# Patient Record
Sex: Male | Born: 1940 | Race: Asian | Hispanic: No | Smoking: Former smoker
Health system: Southern US, Community
[De-identification: ages and names within clinical notes are randomized; demographics above are authoritative.]

## PROBLEM LIST (undated history)

## (undated) DIAGNOSIS — E785 Hyperlipidemia, unspecified: Secondary | ICD-10-CM

## (undated) DIAGNOSIS — R221 Localized swelling, mass and lump, neck: Secondary | ICD-10-CM

## (undated) DIAGNOSIS — J069 Acute upper respiratory infection, unspecified: Secondary | ICD-10-CM

## (undated) DIAGNOSIS — R319 Hematuria, unspecified: Secondary | ICD-10-CM

## (undated) DIAGNOSIS — M545 Low back pain, unspecified: Secondary | ICD-10-CM

## (undated) DIAGNOSIS — Z1211 Encounter for screening for malignant neoplasm of colon: Secondary | ICD-10-CM

## (undated) DIAGNOSIS — N400 Enlarged prostate without lower urinary tract symptoms: Secondary | ICD-10-CM

## (undated) DIAGNOSIS — K635 Polyp of colon: Secondary | ICD-10-CM

## (undated) DIAGNOSIS — M25519 Pain in unspecified shoulder: Secondary | ICD-10-CM

## (undated) DIAGNOSIS — K047 Periapical abscess without sinus: Secondary | ICD-10-CM

## (undated) HISTORY — DX: Periapical abscess without sinus: K04.7

## (undated) HISTORY — DX: Low back pain: M54.5

## (undated) HISTORY — DX: Low back pain, unspecified: M54.50

## (undated) HISTORY — DX: Encounter for screening for malignant neoplasm of colon: Z12.11

## (undated) HISTORY — DX: Polyp of colon: K63.5

## (undated) HISTORY — PX: COLONOSCOPY: SHX174

## (undated) HISTORY — DX: Benign prostatic hyperplasia without lower urinary tract symptoms: N40.0

## (undated) HISTORY — DX: Hyperlipidemia, unspecified: E78.5

## (undated) HISTORY — PX: HAND SURGERY: SHX662

## (undated) HISTORY — DX: Pain in unspecified shoulder: M25.519

## (undated) HISTORY — PX: POLYPECTOMY: SHX149

## (undated) HISTORY — DX: Localized swelling, mass and lump, neck: R22.1

## (undated) HISTORY — DX: Acute upper respiratory infection, unspecified: J06.9

## (undated) HISTORY — DX: Hematuria, unspecified: R31.9

---

## 2003-09-27 ENCOUNTER — Inpatient Hospital Stay (HOSPITAL_COMMUNITY): Admission: EM | Admit: 2003-09-27 | Discharge: 2003-09-29 | Payer: Self-pay | Admitting: Emergency Medicine

## 2004-05-09 LAB — HM COLONOSCOPY

## 2004-08-29 ENCOUNTER — Ambulatory Visit: Payer: Self-pay | Admitting: Internal Medicine

## 2004-10-17 ENCOUNTER — Ambulatory Visit: Payer: Self-pay | Admitting: Internal Medicine

## 2005-01-16 ENCOUNTER — Ambulatory Visit: Payer: Self-pay | Admitting: Internal Medicine

## 2005-09-21 ENCOUNTER — Ambulatory Visit: Payer: Self-pay | Admitting: Internal Medicine

## 2005-09-25 ENCOUNTER — Ambulatory Visit: Payer: Self-pay | Admitting: Internal Medicine

## 2006-01-01 ENCOUNTER — Ambulatory Visit: Payer: Self-pay | Admitting: Internal Medicine

## 2006-04-02 ENCOUNTER — Ambulatory Visit: Payer: Self-pay | Admitting: Internal Medicine

## 2006-04-09 ENCOUNTER — Ambulatory Visit: Payer: Self-pay | Admitting: Internal Medicine

## 2006-10-08 ENCOUNTER — Ambulatory Visit: Payer: Self-pay | Admitting: Internal Medicine

## 2006-10-08 ENCOUNTER — Ambulatory Visit (HOSPITAL_COMMUNITY): Admission: RE | Admit: 2006-10-08 | Discharge: 2006-10-08 | Payer: Self-pay | Admitting: Internal Medicine

## 2007-01-07 ENCOUNTER — Ambulatory Visit: Payer: Self-pay | Admitting: Internal Medicine

## 2007-01-28 ENCOUNTER — Ambulatory Visit: Payer: Self-pay | Admitting: Internal Medicine

## 2007-01-28 LAB — CONVERTED CEMR LAB
ALT: 33 units/L (ref 0–40)
AST: 25 units/L (ref 0–37)
Basophils Absolute: 0 10*3/uL (ref 0.0–0.1)
Basophils Relative: 0.2 % (ref 0.0–1.0)
Cholesterol: 144 mg/dL (ref 0–200)
Eosinophils Absolute: 0.2 10*3/uL (ref 0.0–0.6)
Eosinophils Relative: 4.3 % (ref 0.0–5.0)
Folate: 19.7 ng/mL
HCT: 42.6 % (ref 39.0–52.0)
HDL: 41 mg/dL (ref >39.0)
Hemoglobin: 14.6 g/dL (ref 13.0–17.0)
Hgb A1c MFr Bld: 5.9 % (ref 4.6–6.0)
LDL Cholesterol: 87 mg/dL (ref 0–99)
Lymphocytes Relative: 39.8 % (ref 12.0–46.0)
MCHC: 34.3 g/dL (ref 30.0–36.0)
MCV: 100.8 fL — ABNORMAL HIGH (ref 78.0–100.0)
Monocytes Absolute: 0.3 10*3/uL (ref 0.2–0.7)
Monocytes Relative: 7.2 % (ref 3.0–11.0)
Neutro Abs: 2.2 10*3/uL (ref 1.4–7.7)
Neutrophils Relative %: 48.5 % (ref 43.0–77.0)
PSA: 1.05 ng/mL
PSA: 1.05 ng/mL (ref 0.10–4.00)
Platelets: 155 10*3/uL (ref 150–400)
RBC: 4.23 M/uL (ref 4.22–5.81)
RDW: 11.9 % (ref 11.5–14.6)
TSH: 1.52 microintl units/mL (ref 0.35–5.50)
Total CHOL/HDL Ratio: 3.5
Triglycerides: 82 mg/dL (ref 0–149)
VLDL: 16 mg/dL (ref 0–40)
Vitamin B-12: 586 pg/mL (ref 211–911)
WBC: 4.5 10*3/uL (ref 4.5–10.5)

## 2007-05-31 ENCOUNTER — Encounter: Payer: Self-pay | Admitting: Internal Medicine

## 2007-05-31 DIAGNOSIS — E782 Mixed hyperlipidemia: Secondary | ICD-10-CM | POA: Insufficient documentation

## 2007-05-31 DIAGNOSIS — Z86010 Personal history of colonic polyps: Secondary | ICD-10-CM

## 2007-05-31 DIAGNOSIS — Z87438 Personal history of other diseases of male genital organs: Secondary | ICD-10-CM

## 2007-05-31 DIAGNOSIS — M810 Age-related osteoporosis without current pathological fracture: Secondary | ICD-10-CM | POA: Insufficient documentation

## 2007-05-31 DIAGNOSIS — M545 Low back pain: Secondary | ICD-10-CM

## 2007-07-07 ENCOUNTER — Ambulatory Visit: Payer: Self-pay | Admitting: Internal Medicine

## 2007-12-06 ENCOUNTER — Ambulatory Visit: Payer: Self-pay | Admitting: Internal Medicine

## 2007-12-06 DIAGNOSIS — R0602 Shortness of breath: Secondary | ICD-10-CM

## 2008-03-15 ENCOUNTER — Encounter: Payer: Self-pay | Admitting: Internal Medicine

## 2008-06-08 ENCOUNTER — Ambulatory Visit: Payer: Self-pay | Admitting: Internal Medicine

## 2008-06-08 LAB — CONVERTED CEMR LAB
Free T4: 0.8 ng/dL (ref 0.6–1.6)
T3, Free: 2.9 pg/mL (ref 2.3–4.2)
TSH: 1.33 microintl units/mL (ref 0.35–5.50)

## 2008-06-11 ENCOUNTER — Encounter: Payer: Self-pay | Admitting: Internal Medicine

## 2008-11-06 ENCOUNTER — Ambulatory Visit: Payer: Self-pay | Admitting: Internal Medicine

## 2008-11-06 DIAGNOSIS — K3189 Other diseases of stomach and duodenum: Secondary | ICD-10-CM

## 2008-11-06 DIAGNOSIS — R1013 Epigastric pain: Secondary | ICD-10-CM

## 2008-11-06 DIAGNOSIS — M25539 Pain in unspecified wrist: Secondary | ICD-10-CM

## 2008-11-07 DIAGNOSIS — E119 Type 2 diabetes mellitus without complications: Secondary | ICD-10-CM

## 2008-11-07 DIAGNOSIS — E118 Type 2 diabetes mellitus with unspecified complications: Secondary | ICD-10-CM | POA: Insufficient documentation

## 2008-11-07 DIAGNOSIS — E1165 Type 2 diabetes mellitus with hyperglycemia: Secondary | ICD-10-CM | POA: Insufficient documentation

## 2008-11-09 ENCOUNTER — Telehealth: Payer: Self-pay | Admitting: Internal Medicine

## 2008-11-09 ENCOUNTER — Encounter: Payer: Self-pay | Admitting: Internal Medicine

## 2008-11-16 ENCOUNTER — Ambulatory Visit: Payer: Self-pay | Admitting: Internal Medicine

## 2008-11-16 DIAGNOSIS — I10 Essential (primary) hypertension: Secondary | ICD-10-CM

## 2008-11-16 LAB — CONVERTED CEMR LAB
ALT: 32 units/L (ref 0–53)
AST: 23 units/L (ref 0–37)
BUN: 13 mg/dL (ref 6–23)
CO2: 30 meq/L (ref 19–32)
Calcium: 9.2 mg/dL (ref 8.4–10.5)
Chloride: 104 meq/L (ref 96–112)
Cholesterol: 155 mg/dL (ref 0–200)
Creatinine, Ser: 0.7 mg/dL (ref 0.4–1.5)
Creatinine,U: 111 mg/dL
GFR calc Af Amer: 145 mL/min
GFR calc non Af Amer: 120 mL/min
Glucose, Bld: 104 mg/dL — ABNORMAL HIGH (ref 70–99)
HDL: 37.7 mg/dL — ABNORMAL LOW (ref >39.0)
Hgb A1c MFr Bld: 6 % (ref 4.6–6.0)
LDL Cholesterol: 93 mg/dL (ref 0–99)
Microalb Creat Ratio: 5.4 mg/g (ref 0.0–30.0)
Microalb, Ur: 0.6 mg/dL (ref 0.0–1.9)
Potassium: 4.1 meq/L (ref 3.5–5.1)
Sodium: 140 meq/L (ref 135–145)
TSH: 0.93 microintl units/mL (ref 0.35–5.50)
Total CHOL/HDL Ratio: 4.1
Triglycerides: 121 mg/dL (ref 0–149)
VLDL: 24 mg/dL (ref 0–40)

## 2009-04-17 ENCOUNTER — Encounter (INDEPENDENT_AMBULATORY_CARE_PROVIDER_SITE_OTHER): Payer: Self-pay | Admitting: *Deleted

## 2009-06-07 ENCOUNTER — Ambulatory Visit: Payer: Self-pay | Admitting: Internal Medicine

## 2009-06-14 ENCOUNTER — Ambulatory Visit: Payer: Self-pay | Admitting: Internal Medicine

## 2009-06-17 ENCOUNTER — Encounter: Payer: Self-pay | Admitting: Internal Medicine

## 2009-06-21 LAB — CONVERTED CEMR LAB
ALT: 25 units/L (ref 0–53)
AST: 20 units/L (ref 0–37)
Albumin: 4.3 g/dL (ref 3.5–5.2)
Alkaline Phosphatase: 65 units/L (ref 39–117)
BUN: 13 mg/dL (ref 6–23)
Bilirubin, Direct: 0.2 mg/dL (ref 0.0–0.3)
CO2: 23 meq/L (ref 19–32)
Calcium: 9 mg/dL (ref 8.4–10.5)
Chloride: 109 meq/L (ref 96–112)
Cholesterol: 141 mg/dL (ref 0–200)
Creatinine, Ser: 0.88 mg/dL (ref 0.40–1.50)
Creatinine, Urine: 168.2 mg/dL
Glucose, Bld: 107 mg/dL — ABNORMAL HIGH (ref 70–99)
HDL: 39 mg/dL — ABNORMAL LOW (ref >39)
Hgb A1c MFr Bld: 6 % (ref 4.6–6.1)
Indirect Bilirubin: 0.6 mg/dL (ref 0.0–0.9)
LDL Cholesterol: 75 mg/dL (ref 0–99)
Microalb Creat Ratio: 4.3 mg/g (ref 0.0–30.0)
Microalb, Ur: 0.72 mg/dL (ref 0.00–1.89)
Potassium: 4.2 meq/L (ref 3.5–5.3)
Sodium: 141 meq/L (ref 135–145)
TSH: 1.488 microintl units/mL (ref 0.350–4.500)
Total Bilirubin: 0.8 mg/dL (ref 0.3–1.2)
Total CHOL/HDL Ratio: 3.6
Total Protein: 7.4 g/dL (ref 6.0–8.3)
Triglycerides: 133 mg/dL (ref <150)
VLDL: 27 mg/dL (ref 0–40)

## 2009-08-26 ENCOUNTER — Ambulatory Visit (HOSPITAL_BASED_OUTPATIENT_CLINIC_OR_DEPARTMENT_OTHER): Admission: RE | Admit: 2009-08-26 | Discharge: 2009-08-26 | Payer: Self-pay | Admitting: Internal Medicine

## 2009-08-26 ENCOUNTER — Ambulatory Visit: Payer: Self-pay | Admitting: Internal Medicine

## 2009-08-26 DIAGNOSIS — R05 Cough: Secondary | ICD-10-CM

## 2009-10-30 ENCOUNTER — Telehealth: Payer: Self-pay | Admitting: Internal Medicine

## 2009-10-30 ENCOUNTER — Ambulatory Visit: Payer: Self-pay | Admitting: Interventional Radiology

## 2009-10-30 ENCOUNTER — Ambulatory Visit (HOSPITAL_BASED_OUTPATIENT_CLINIC_OR_DEPARTMENT_OTHER): Admission: RE | Admit: 2009-10-30 | Discharge: 2009-10-30 | Payer: Self-pay | Admitting: Internal Medicine

## 2009-10-30 ENCOUNTER — Ambulatory Visit: Payer: Self-pay | Admitting: Internal Medicine

## 2009-11-04 ENCOUNTER — Telehealth: Payer: Self-pay | Admitting: Internal Medicine

## 2009-12-06 ENCOUNTER — Telehealth: Payer: Self-pay | Admitting: Internal Medicine

## 2009-12-20 ENCOUNTER — Ambulatory Visit: Payer: Self-pay | Admitting: Internal Medicine

## 2009-12-20 ENCOUNTER — Telehealth: Payer: Self-pay | Admitting: Internal Medicine

## 2009-12-20 ENCOUNTER — Ambulatory Visit: Payer: Self-pay | Admitting: Diagnostic Radiology

## 2009-12-20 ENCOUNTER — Ambulatory Visit (HOSPITAL_BASED_OUTPATIENT_CLINIC_OR_DEPARTMENT_OTHER): Admission: RE | Admit: 2009-12-20 | Discharge: 2009-12-20 | Payer: Self-pay | Admitting: Internal Medicine

## 2009-12-20 DIAGNOSIS — J111 Influenza due to unidentified influenza virus with other respiratory manifestations: Secondary | ICD-10-CM

## 2010-05-08 ENCOUNTER — Encounter: Payer: Self-pay | Admitting: Internal Medicine

## 2010-06-20 ENCOUNTER — Telehealth: Payer: Self-pay | Admitting: Internal Medicine

## 2010-07-18 ENCOUNTER — Ambulatory Visit: Payer: Self-pay | Admitting: Internal Medicine

## 2010-07-18 LAB — HM DIABETES FOOT EXAM

## 2010-07-21 ENCOUNTER — Telehealth: Payer: Self-pay | Admitting: Internal Medicine

## 2010-07-31 ENCOUNTER — Encounter (INDEPENDENT_AMBULATORY_CARE_PROVIDER_SITE_OTHER): Payer: Self-pay | Admitting: *Deleted

## 2010-08-01 ENCOUNTER — Encounter: Payer: Self-pay | Admitting: Internal Medicine

## 2010-08-12 ENCOUNTER — Telehealth: Payer: Self-pay | Admitting: Internal Medicine

## 2010-08-22 ENCOUNTER — Telehealth: Payer: Self-pay | Admitting: Internal Medicine

## 2010-08-29 ENCOUNTER — Encounter: Payer: Self-pay | Admitting: Internal Medicine

## 2010-08-31 ENCOUNTER — Encounter: Payer: Self-pay | Admitting: Internal Medicine

## 2010-09-02 LAB — CONVERTED CEMR LAB
ALT: 31 units/L (ref 0–53)
AST: 25 units/L (ref 0–37)
Albumin: 4.6 g/dL (ref 3.5–5.2)
Alkaline Phosphatase: 67 units/L (ref 39–117)
BUN: 13 mg/dL (ref 6–23)
Bilirubin, Direct: 0.3 mg/dL (ref 0.0–0.3)
CO2: 25 meq/L (ref 19–32)
Calcium: 9 mg/dL (ref 8.4–10.5)
Chloride: 105 meq/L (ref 96–112)
Cholesterol: 114 mg/dL (ref 0–200)
Creatinine, Ser: 0.96 mg/dL (ref 0.40–1.50)
Creatinine, Urine: 147.1 mg/dL
Glucose, Bld: 96 mg/dL (ref 70–99)
HDL: 35 mg/dL — ABNORMAL LOW (ref >39)
Hgb A1c MFr Bld: 5.8 % — ABNORMAL HIGH (ref <5.7)
Indirect Bilirubin: 0.6 mg/dL (ref 0.0–0.9)
LDL Cholesterol: 60 mg/dL (ref 0–99)
Microalb Creat Ratio: 3.4 mg/g (ref 0.0–30.0)
Microalb, Ur: 0.5 mg/dL (ref 0.00–1.89)
PSA: 1.11 ng/mL (ref 0.10–4.00)
Potassium: 4.4 meq/L (ref 3.5–5.3)
Sodium: 141 meq/L (ref 135–145)
TSH: 1.467 microintl units/mL (ref 0.350–4.500)
Total Bilirubin: 0.9 mg/dL (ref 0.3–1.2)
Total CHOL/HDL Ratio: 3.3
Total Protein: 7.3 g/dL (ref 6.0–8.3)
Triglycerides: 94 mg/dL (ref <150)
VLDL: 19 mg/dL (ref 0–40)

## 2010-12-02 NOTE — Progress Notes (Signed)
Summary: Metformin refill  Phone Note Refill Request Message from:  Fax from Pharmacy on November 04, 2009 2:57 PM  Refills Requested: Medication #1:  METFORMIN HCL 500 MG  TABS Take 1 tablet by mouth once a day Patient was just seen, how many refills allowed?  Initial call taken by: Lucious Groves,  November 04, 2009 2:57 PM  Follow-up for Phone Call        I see that MD refilled both simvastatin and metformin. Closed phone note. Follow-up by: Lucious Groves,  November 05, 2009 2:14 PM    Prescriptions: SIMVASTATIN 20 MG  TABS (SIMVASTATIN) Take 1 tablet by mouth once a day at bedtime  #90 Each x 1   Entered and Authorized by:   D. Thomos Lemons DO   Signed by:   D. Thomos Lemons DO on 11/04/2009   Method used:   Electronically to        [facility] [phone]* (retail)       [address]       [place], [place]  [address]       Ph: [phone]       Fax: [phone]   RxID:   [id number] METFORMIN HCL 500 MG  TABS (METFORMIN HCL) Take 1 tablet by mouth once a day  #180 x 1   Entered and Authorized by:   D. Thomos Lemons DO   Signed by:   D. Thomos Lemons DO on 11/04/2009   Method used:   Electronically to        [facility]. [phone]* (retail)       [address]       [place], [place]  [address]       Ph: [phone]       Fax: [phone]   RxID:   [id number]

## 2010-12-02 NOTE — Assessment & Plan Note (Signed)
Summary: 6 month fu/dt   Vital Signs:  Patient profile:   70 year old male Height:      67 inches Weight:      149 pounds BMI:     23.42 Temp:     97.5 degrees F oral Pulse rate:   76 / minute Pulse rhythm:   regular Resp:     16 per minute BP sitting:   127 / 70  (right arm) Cuff size:   regular  Vitals Entered By: Lanier Prude, Beverly Gust) (July 18, 2010 9:38 AM) CC: 6 mo f/u Is Patient Diabetic? No   Primary Care Provider:  Dondra Spry DO  CC:  6 mo f/u.  History of Present Illness: 70 y/o [place] male for f/u re:  abnormal glucose he plays tennis several x per week he works as Location manager for [employer].  job keeps him physically active  he had labs through his company last A1c reported as 6.0 in Aug 2011  wife and daughter concerned he is eating more than usual wt is same   Current Diet: Breakfast:  rice, fruit (melon), vegetable Lunch: rice, vegetable DInner:  rice, fruit,   Snacks:  peanut butter sandwich, bread, peanuts eats meat once or twice per week   Preventive Screening-Counseling & Management  Alcohol-Tobacco     Smoking Status: never  Current Medications (verified): 1)  Metformin Hcl 500 Mg  Tabs (Metformin Hcl) .... Take 1 Tablet By Mouth Once A Day 2)  Simvastatin 20 Mg  Tabs (Simvastatin) .... Take 1 Tablet By Mouth Once A Day At Bedtime 3)  Fish Oil 1000 Mg  Caps (Omega-3 Fatty Acids) .... Take 1 Tablet By Mouth Once A Day 4)  Vitamin C 500 Mg  Tabs (Ascorbic Acid) .... Take 1 Tablet By Mouth Once A Day 5)  Multivitamins   Tabs (Multiple Vitamin) .... Take 1 Tablet By Mouth Once A Day 6)  Onetouch Ultra Test   Strp (Glucose Blood) .... For Once Daily Testing 7)  Cefuroxime Axetil 500 Mg Tabs (Cefuroxime Axetil) .... One By Mouth Bid 8)  Tamiflu 75 Mg Caps (Oseltamivir Phosphate) .... One By Mouth Two Times A Day 9)  Aspirin 81 Mg Tbec (Aspirin) .Marland Kitchen.. 1 Once Daily 10)  Calcium 500 Mg Tabs (Calcium) .Marland Kitchen.. 1 Two Times A  Day  Allergies (verified): 1)  ! Flomax  Past History:  Past Medical History: Diabetes mellitus, type II - borderline Hyperlipidemia Low back pain Osteoporosis     Benign prostatic hypertrophy Colonic polyps, hx of    Past Surgical History: Hand surgery           Social History: Married Alcohol use-no Tobacco - no     Supportive daughter      Physical Exam  General:  alert, well-developed, and well-nourished.   Neck:  supple, no masses, and no carotid bruits.   Lungs:  normal respiratory effort, normal breath sounds, no crackles, and no wheezes.   Heart:  normal rate, regular rhythm, no murmur, and no gallop.   Extremities:  No lower extremity edema   Diabetes Management Exam:    Foot Exam (with socks and/or shoes not present):       Inspection:          Left foot: normal          Right foot: normal   Impression & Recommendations:  Problem # 1:  DIABETES MELLITUS, TYPE II, BORDERLINE (ICD-790.29) Assessment Unchanged A1c stable.  fine tuning of  diet recommended  His updated medication list for this problem includes:    Metformin Hcl 500 Mg Tabs (Metformin hcl) .Marland Kitchen... Take 1 tablet by mouth once a day   His updated medication list for this problem includes:    Metformin Hcl 500 Mg Tabs (Metformin hcl) .Marland Kitchen... Take 1 tablet by mouth once a day  Labs Reviewed: Creat: 0.88 (06/14/2009)     Problem # 2:  HYPERLIPIDEMIA (ICD-272.4) Assessment: Unchanged  His updated medication list for this problem includes:    Simvastatin 20 Mg Tabs (Simvastatin) .Marland Kitchen... Take 1 tablet by mouth once a day at bedtime  Labs Reviewed: SGOT: 20 (06/14/2009)   SGPT: 25 (06/14/2009)   HDL:39 (06/14/2009), 37.7 (11/16/2008)  LDL:75 (06/14/2009), 93 (11/16/2008)  Chol:141 (06/14/2009), 155 (11/16/2008)  Trig:133 (06/14/2009), 121 (11/16/2008)  Problem # 3:  COLONIC POLYPS, HX OF (ICD-V12.72)  Orders: Gastroenterology Referral (GI)  Complete Medication List: 1)  Metformin Hcl 500  Mg Tabs (Metformin hcl) .... Take 1 tablet by mouth once a day 2)  Simvastatin 20 Mg Tabs (Simvastatin) .... Take 1 tablet by mouth once a day at bedtime 3)  Fish Oil 1000 Mg Caps (Omega-3 fatty acids) .... Take 1 tablet by mouth once a day 4)  Vitamin C 500 Mg Tabs (Ascorbic acid) .... Take 1 tablet by mouth once a day 5)  Multivitamins Tabs (Multiple vitamin) .... Take 1 tablet by mouth once a day 6)  Onetouch Ultra Test Strp (Glucose blood) .... For once daily testing 7)  Aspirin 81 Mg Tbec (Aspirin) .Marland Kitchen.. 1 once daily 8)  Calcium 500 Mg Tabs (Calcium) .Marland Kitchen.. 1 two times a day  Other Orders: Admin 1st Vaccine (29562) Flu Vaccine 100yrs + (13086)  Patient Instructions: 1)  Please schedule a follow-up appointment in 6 months. 2)  BMP prior to visit, ICD-9: 790.29 3)  HbgA1C prior to visit, ICD-9: 790.29 4)  Please return for lab work one (1) week before your next appointment.  Prescriptions: SIMVASTATIN 20 MG  TABS (SIMVASTATIN) Take 1 tablet by mouth once a day at bedtime  #90 Each x 1   Entered and Authorized by:   D. Thomos Lemons DO   Signed by:   D. Thomos Lemons DO on 07/18/2010   Method used:   Electronically to        [id number]* (retail)       [address]       [place], [place]  [address]       Ph: [phone]       Fax: [phone]   RxID:   [id number] METFORMIN HCL 500 MG  TABS (METFORMIN HCL) Take 1 tablet by mouth once a day  #180 x 1   Entered and Authorized by:   D. Thomos Lemons DO   Signed by:   D. Thomos Lemons DO on 07/18/2010   Method used:   Electronically to        [id number]* (retail)       [address]       [place], [place]  [address]       Ph: [phone]       Fax: [phone]   RxID:   [id number]  .lbflu   Flu Vaccine Consent Questions     Do you have a history of severe allergic reactions to this vaccine? no    Any prior history of allergic reactions to egg and/or gelatin? no    Do you have a sensitivity to the preservative Thimersol? no  Do you  have a past history of Guillan-Barre Syndrome? no    Do you currently have an acute febrile illness? no    Have you ever had a severe reaction to latex? no    Vaccine information given and explained to patient? yes    Are you currently pregnant? no    Lot Number:[id number]   Exp Date:05/02/2011   Site Given  Left Deltoid IM Lanier Prude, [facility])  July 18, 2010 12:16 PM

## 2010-12-02 NOTE — Progress Notes (Signed)
Summary: labs needed?  Phone Note Other Incoming Call back at [phone] until 1pm.  [phone] after 1pm, may leave message on machine.   Caller: Esmond Harps, daughter Summary of Call: Pt's daughter called and stated that she spoke to you about labs that pt needs to have and we were supposed to contact them to arrange lab appt. She states that she brought test results from pt's employer and showed to you at her mother's appt on 07/30/10. Please advise.  Nicki Guadalajara Fergerson CMA Duncan Dull)  August 12, 2010 10:38 AM   Follow-up for Phone Call        I suggest labs in Dec, 2011  BMP prior to visit, ICD-9:  250.00 Hepatic Panel prior to visit, ICD-9:  272.4 Lipid Panel prior to visit, ICD-9:  272.4 TSH prior to visit, ICD-9: 272.4 HbgA1C prior to visit, ICD-9: 250.00 Urine Microalbumin prior to visit, ICD-9: 250.00  Follow-up by: D. Thomos Lemons DO,  August 12, 2010 5:29 PM  Additional Follow-up for Phone Call Additional follow up Details #1::        Notified pts daughter. Will send order for labs for 1st week of December. Pt's daughter provided with lab hours and order sent to [facility].  Nicki Guadalajara Fergerson CMA Duncan Dull)  August 13, 2010 12:38 PM

## 2010-12-02 NOTE — Progress Notes (Signed)
Summary: lab work ?  Phone Note Call from Patient Call back at [phone]   Caller: patient daughter eun Call For: yoo  Summary of Call: patient daughter calling to ask if her father needs lab work done anytime soon.  She is questioning that he is a diabetic and is not having lab work done on a regular basis please call and advise  Initial call taken by: Roselle Locus,  July 21, 2010 11:11 AM  Follow-up for Phone Call        pt stated during last visit,  he had labs drawn at work in August.   plz have pt forward copy of those labs. I can make recommendations re:  next lab draw after reviewing results Follow-up by: D. Thomos Lemons DO,  July 21, 2010 5:59 PM  Additional Follow-up for Phone Call Additional follow up Details #1::        Left detailed message on daughter's phone re: Dr Olegario Messier instructions. Nicki Guadalajara Fergerson CMA Duncan Dull)  July 22, 2010 12:58 PM

## 2010-12-02 NOTE — Letter (Signed)
Summary: Health Screening/[facility]  Health Screening/[facility]   Imported By: Lanelle Bal 08/11/2010 11:25:34  _____________________________________________________________________  External Attachment:    Type:   Image     Comment:   External Document

## 2010-12-02 NOTE — Miscellaneous (Signed)
Summary: Orders Update  Clinical Lists Changes  Orders: Added new Test order of T-Basic Metabolic Panel ([id number]) - Signed Added new Test order of T- Hemoglobin A1C ([id number]) - Signed 

## 2010-12-02 NOTE — Assessment & Plan Note (Signed)
Summary: chest congestion/mhf   Vital Signs:  Patient profile:   70 year old male Weight:      148.75 pounds BMI:     23.38 O2 Sat:      98 % on Room air Temp:     98.3 degrees F oral Pulse rate:   75 / minute Pulse rhythm:   regular Resp:     18 per minute BP sitting:   110 / 60  (right arm) Cuff size:   regular  Vitals Entered By: Glendell Docker CMA (December 20, 2009 1:29 PM)  O2 Flow:  Room air  Primary Care Provider:  D. Thomos Lemons DO  CC:  Does not feel well.  History of Present Illness: 70 y/o [place] male c/o  body aches, sore throat,  neck tenderness,  upper shoulder aches,  cough, chest congestion since Wednesday  grand children are sick at home with ear infections  Allergies: 1)  ! Flomax  Past History:  Past Medical History: Diabetes mellitus, type II - borderline Hyperlipidemia Low back pain Osteoporosis    Benign prostatic hypertrophy Colonic polyps, hx of    Past Surgical History: Hand surgery          Social History: Married Alcohol use-no Tobacco - no     Supportive daughter     Physical Exam  General:  alert, well-developed, and well-nourished.   Ears:  R ear normal and L ear normal.   Mouth:  pharyngeal erythema.   Neck:  supple.  mild neck tenderness Lungs:  normal respiratory effort, normal breath sounds, no crackles, and no wheezes.   Heart:  normal rate, regular rhythm, no murmur, and no gallop.     Impression & Recommendations:  Problem # 1:  COUGH (ICD-786.2) Assessment Comment Only Hx of pna in 10/30/2009.  Arrange f/u cxr to ensure clearing. Orders: T-2 View CXR, Same Day (71020.5TC)  Problem # 2:  FLU SYNDROME (ICD-487.1) 70 y/o with flu like syndrome.  empiric tamiflu and ceftin.  symptomatic tx reviewed.  Patient advised to call office if symptoms persist or worsen.  Complete Medication List: 1)  Metformin Hcl 500 Mg Tabs (Metformin hcl) .... Take 1 tablet by mouth once a day 2)  Simvastatin 20 Mg Tabs  (Simvastatin) .... Take 1 tablet by mouth once a day at bedtime 3)  Fish Oil 1000 Mg Caps (Omega-3 fatty acids) .... Take 1 tablet by mouth once a day 4)  Vitamin C 500 Mg Tabs (Ascorbic acid) .... Take 1 tablet by mouth once a day 5)  Multivitamins Tabs (Multiple vitamin) .... Take 1 tablet by mouth once a day 6)  Onetouch Ultra Test Strp (Glucose blood) .... For once daily testing 7)  Cefuroxime Axetil 500 Mg Tabs (Cefuroxime axetil) .... One by mouth bid 8)  Tamiflu 75 Mg Caps (Oseltamivir phosphate) .... One by mouth two times a day  Patient Instructions: 1)  Gargle with warm salt water. 2)  Take cefuroxime and tamiflu as directed. 3)  Increase fluid intake 4)  Take tylenol 650 mg two times a day as needed 5)  Patient advised to call office if symptoms persist or worsen. Prescriptions: TAMIFLU 75 MG CAPS (OSELTAMIVIR PHOSPHATE) one by mouth two times a day  #10 x 0   Entered and Authorized by:   D. Thomos Lemons DO   Signed by:   D. Thomos Lemons DO on 12/20/2009   Method used:   Electronically to        [facility] [phone]* (retail)       [address]       [place], [place]  [address]       Ph: [phone]       Fax: [phone]   RxID:   [id number] CEFUROXIME AXETIL 500 MG TABS (CEFUROXIME AXETIL) one by mouth bid  #14 x 0   Entered and Authorized by:   D. Thomos Lemons DO   Signed by:   D. Thomos Lemons DO on 12/20/2009   Method used:   Electronically to        [facility]. [phone]* (retail)       [address]       [place], [place]  [address]       Ph: [phone]       Fax: [phone]   RxID:   [id number]   Current Allergies (reviewed today): ! FLOMAX

## 2010-12-02 NOTE — Letter (Signed)
   [place] at [facility] [address] [place], [place]  [address]  [place] Phone: [phone]      August 31, 2010   [place] [address] [place], [place] [address]  RE:  LAB RESULTS  Dear  Mr. Cegielski,  The following is an interpretation of your most recent lab tests.  Please take note of any instructions provided or changes to medications that have resulted from your lab work.  PSA:  normal - no follow-up needed PSA: 1.11  ELECTROLYTES:  Good - no changes needed  KIDNEY FUNCTION TESTS:  Good - no changes needed  LIPID PANEL:  Good - no changes needed Triglyceride: 94   Cholesterol: 114   LDL: 60   HDL: 35   Chol/HDL%:  3.3 Ratio  THYROID STUDIES:  Thyroid studies normal TSH: 1.467     DIABETIC STUDIES:  Good - no changes needed Blood Glucose: 96   HgbA1C: 5.8   Microalbumin/Creatinine Ratio: 3.4          Sincerely Yours,    Dr. Thomos Lemons  Appended Document:  mailed

## 2010-12-02 NOTE — Letter (Signed)
Summary: Out of Work  Adult nurse at [facility]. [address]   [place], [place] [address]   Phone: [phone]  Fax: [phone]    July 18, 2010   Patient:  Marvin Mcdonald [facility] Employee: Yoseph Haile   To Whom It May Concern:  The above named employee accompanied their parent to a doctor's appt today. Please excuse her from work for the following dates:  Start:   07-18-10  End:   07-18-10  If you need additional information, please feel free to contact our office.         Sincerely,    Lanier Prude, [facility])

## 2010-12-02 NOTE — Progress Notes (Signed)
Summary: Needs bloodwork for discount  Phone Note Call from Patient Call back at Home Phone [phone]   Caller: Daughter Reason for Call: Talk to Nurse Summary of Call: Pt's daughter Esmond Harps would like to have her father's bloodwork done ASAP to receive a discount for his insurance Initial call taken by: Lannette Donath,  August 22, 2010 9:48 AM  Follow-up for Phone Call        Blood work is scheduled for December. Is it okay for patient to go ahead and have the blood work done? Follow-up by: Glendell Docker CMA,  August 22, 2010 12:58 PM  Additional Follow-up for Phone Call Additional follow up Details #1::        yes BMP prior to visit, ICD-9:  250.00 Hepatic Panel prior to visit, ICD-9:  272.4 Lipid Panel prior to visit, ICD-9:  272.4 TSH prior to visit, ICD-9: 272.4 HbgA1C prior to visit, ICD-9: 250.00 Urine Microalbumin prior to visit, ICD-9: 250.00 PSA: v76.44  Additional Follow-up by: D. Thomos Lemons DO,  August 22, 2010 1:45 PM    Additional Follow-up for Phone Call Additional follow up Details #2::    DAUGHTER CALLED BACK ADVISED HER dR yOO SAID OK TO GO NEXT WEEK. HE WILL COME ON FRIDAY FOR LAB WORK Roselle Locus  August 22, 2010 3:08 PM  Additional Follow-up for Phone Call Additional follow up Details #3:: Details for Additional Follow-up Action Taken: orders have been faxed downstairs to [facility] at [facility] Additional Follow-up by: Glendell Docker CMA,  August 22, 2010 3:30 PM

## 2010-12-02 NOTE — Progress Notes (Signed)
Summary: Schedule Colonoscopy   Phone Note Outgoing Call   Call placed by: Hortense Ramal CMA Duncan Dull),  December 06, 2009 4:31 PM Call placed to: Patient Summary of Call: Patient is due for a recall colonoscopy. I have tried to contact patient at his home address, however, that number is disconnected. I have also called his work number, but there is only a Engineer, technical sales with employee names and none correspond with the patient's name. We will send a letter. Initial call taken by: Hortense Ramal CMA Duncan Dull),  December 06, 2009 4:32 PM

## 2010-12-02 NOTE — Letter (Signed)
Summary: Pre Visit Letter Revised  [facility]  [address] [place], [place] [address]   Phone: [phone]  Fax: [phone]        07/31/2010 MRN: [id number] [facility] [address] [place], [place]  [address]             Procedure Date:  09-11-10   Welcome to the Gastroenterology Division at [facility].    You are scheduled to see a nurse for your pre-procedure visit on 08-28-10 at 2:00P.M. on the 3rd floor at [facility], [address]  We ask that you try to arrive at our office 15 minutes prior to your appointment time to allow for check-in.  Please take a minute to review the attached form.  If you answer "Yes" to one or more of the questions on the first page, we ask that you call the person listed at your earliest opportunity.  If you answer "No" to all of the questions, please complete the rest of the form and bring it to your appointment.    Your nurse visit will consist of discussing your medical and surgical history, your immediate family medical history, and your medications.   If you are unable to list all of your medications on the form, please bring the medication bottles to your appointment and we will list them.  We will need to be aware of both prescribed and over the counter drugs.  We will need to know exact dosage information as well.    Please be prepared to read and sign documents such as consent forms, a financial agreement, and acknowledgement forms.  If necessary, and with your consent, a friend or relative is welcome to sit-in on the nurse visit with you.  Please bring your insurance card so that we may make a copy of it.  If your insurance requires a referral to see a specialist, please bring your referral form from your primary care physician.  No co-pay is required for this nurse visit.     If you cannot keep your appointment, please call [phone] to cancel or reschedule prior to your appointment date.  This allows [place]  the opportunity to schedule an appointment for another patient in need of care.    Thank you for choosing [facility] for your medical needs.  We appreciate the opportunity to care for you.  Please visit [place] at our website  to learn more about our practice.  Sincerely, The Gastroenterology Division

## 2010-12-02 NOTE — Progress Notes (Signed)
Summary: Pharmacy Change  Phone Note Call from Patient   Caller: Daughter Summary of Call: Pls send all future Rx to [facility] [address] [place] Initial call taken by: Lannette Donath,  June 20, 2010 1:03 PM  Follow-up for Phone Call        call placed to patients daughter Willies Laviolette. She was advised patient is due for follow up appointment. Pharmacy change will be noted Follow-up by: Glendell Docker CMA,  June 24, 2010 9:55 AM

## 2010-12-02 NOTE — Progress Notes (Signed)
Summary: Test Results  Phone Note Outgoing Call   Summary of Call: call pt - chest x ray negative for pna or acute findings Initial call taken by: D. Thomos Lemons DO,  December 20, 2009 4:35 PM  Follow-up for Phone Call        patient advised per Dr Artist Pais instructions Follow-up by: Glendell Docker CMA,  December 20, 2009 5:09 PM

## 2011-01-16 ENCOUNTER — Encounter: Payer: Self-pay | Admitting: Internal Medicine

## 2011-01-16 ENCOUNTER — Ambulatory Visit (INDEPENDENT_AMBULATORY_CARE_PROVIDER_SITE_OTHER): Payer: PRIVATE HEALTH INSURANCE | Admitting: Internal Medicine

## 2011-01-16 DIAGNOSIS — R7309 Other abnormal glucose: Secondary | ICD-10-CM

## 2011-01-16 DIAGNOSIS — E785 Hyperlipidemia, unspecified: Secondary | ICD-10-CM

## 2011-01-16 LAB — CONVERTED CEMR LAB
BUN: 14 mg/dL (ref 6–23)
CO2: 24 meq/L (ref 19–32)
Calcium: 9 mg/dL (ref 8.4–10.5)
Chloride: 105 meq/L (ref 96–112)
Creatinine, Ser: 0.8 mg/dL (ref 0.40–1.50)
Glucose, Bld: 130 mg/dL — ABNORMAL HIGH (ref 70–99)
Hgb A1c MFr Bld: 5.9 % — ABNORMAL HIGH (ref <5.7)
Potassium: 4.2 meq/L (ref 3.5–5.3)
Sodium: 140 meq/L (ref 135–145)

## 2011-01-18 ENCOUNTER — Encounter: Payer: Self-pay | Admitting: Internal Medicine

## 2011-01-20 NOTE — Letter (Signed)
Summary: Work Dietitian at [employer]. [address]   [place], [place] [address]   Phone: [phone]  Fax: [phone]      Today's Date: January 16, 2011    Name of Patient: Marvin Mcdonald [facility]    The above named patient  was accompanied by his daughter Janet Decesare.at his medical visit today. Please take this into consideration when reviewing the time away from work.  Special Instructions:  [ X ] None  [  ] To be off the remainder of today, returning to the normal work / school schedule tomorrow.  [  ] To be off until the next scheduled appointment on ______________________.  [  ] Other ________________________________________________________________ ________________________________________________________________________   Sincerely yours,   Glendell Docker CMA Dr. Thomos Lemons

## 2011-01-29 NOTE — Letter (Signed)
   [place] at [facility] [address] [place], [place]  [address]  [place] Phone: [phone]      January 18, 2011   [place] [address] [place], [place] [address]  RE:  LAB RESULTS  Dear  Mr. Weitman,  The following is an interpretation of your most recent lab tests.  Please take note of any instructions provided or changes to medications that have resulted from your lab work.  ELECTROLYTES:  Good - no changes needed  KIDNEY FUNCTION TESTS:  Good - no changes needed    DIABETIC STUDIES:  Good - no changes needed Blood Glucose: 130   HgbA1C: 5.9   Microalbumin/Creatinine Ratio: 3.4          Sincerely Yours,    Dr. Thomos Lemons  Appended Document:  mailed

## 2011-02-03 NOTE — Assessment & Plan Note (Signed)
Summary: 6 month fu//dt   Vital Signs:  Patient profile:   70 year old male Height:      67 inches Weight:      150.25 pounds BMI:     23.62 O2 Sat:      97 % on Room air Temp:     97.5 degrees F oral Pulse rate:   65 / minute Resp:     18 per minute BP sitting:   106 / 70  (right arm) Cuff size:   regular  Vitals Entered By: Glendell Docker CMA (January 16, 2011 9:21 AM)  O2 Flow:  Room air CC: 6 Month Follow up, Type 2 diabetes mellitus follow-up Comments no concerns, need clarification on diabetes status for daughter, non- fasting   Primary Care Provider:  Dondra Spry DO  CC:  6 Month Follow up and Type 2 diabetes mellitus follow-up.  History of Present Illness:  Borderline Type 2 Diabetes Mellitus Follow-Up      This is a 70 year old man who presents for Type 2 diabetes mellitus follow-up.  The patient denies polyuria, polydipsia, weight loss, and weight gain.  The patient denies the following symptoms: chest pain.  Since the last visit the patient reports good dietary compliance and exercising regularly.    hyperlipidemia - stable.  good med compliance  Preventive Screening-Counseling & Management  Alcohol-Tobacco     Smoking Status: never  Allergies: 1)  ! Flomax  Past History:  Past Medical History: Diabetes mellitus, type II - borderline Hyperlipidemia Low back pain  Osteoporosis     Benign prostatic hypertrophy Colonic polyps, hx of    Social History: Married Alcohol use-no Tobacco - no     Supportive daughter       Physical Exam  General:  alert, well-developed, and well-nourished.   Neck:  No deformities, masses, or tenderness noted. Lungs:  normal respiratory effort and normal breath sounds.   Heart:  normal rate, regular rhythm, and no gallop.     Impression & Recommendations:  Problem # 1:  DIABETES MELLITUS, TYPE II, BORDERLINE (ICD-790.29) Assessment Unchanged I suspect pt can manage blood sugar with diet alone.  DC  metformin reasses A1c in 3 months The following medications were removed from the medication list:    Metformin Hcl 500 Mg Tabs (Metformin hcl) .Marland Kitchen... Take 1 tablet by mouth once a day  Orders: T-Basic Metabolic Panel [phone]) T- Hemoglobin A1C [id number])  Labs Reviewed: Creat: 0.96 (08/29/2010)     Problem # 2:  HYPERLIPIDEMIA (ICD-272.4) Assessment: Unchanged  His updated medication list for this problem includes:    Simvastatin 20 Mg Tabs (Simvastatin) .Marland Kitchen... Take 1 tablet by mouth once a day at bedtime  Labs Reviewed: SGOT: 25 (08/29/2010)   SGPT: 31 (08/29/2010)   HDL:35 (08/29/2010), 39 (06/14/2009)  LDL:60 (08/29/2010), 75 (06/14/2009)  Chol:114 (08/29/2010), 141 (06/14/2009)  Trig:94 (08/29/2010), 133 (06/14/2009)  Complete Medication List: 1)  Simvastatin 20 Mg Tabs (Simvastatin) .... Take 1 tablet by mouth once a day at bedtime 2)  Fish Oil 1000 Mg Caps (Omega-3 fatty acids) .... Take 1 tablet by mouth once a day 3)  Vitamin C 500 Mg Tabs (Ascorbic acid) .... Take 1 tablet by mouth once a day 4)  Multivitamins Tabs (Multiple vitamin) .... Take 1 tablet by mouth once a day 5)  Onetouch Ultra Test Strp (Glucose blood) .... For once daily testing 6)  Aspirin 81 Mg Tbec (Aspirin) .Marland Kitchen.. 1 once daily 7)  Calcium 500 Mg Tabs (  Calcium) .Marland Kitchen.. 1 two times a day  Patient Instructions: 1)  Please schedule a follow-up appointment in 6 months. 2)  BMP prior to visit, ICD-9:  401.9 3)  HbgA1C prior to visit, ICD-9: 790.9 4)  Please return for lab work in 3 months Prescriptions: SIMVASTATIN 20 MG  TABS (SIMVASTATIN) Take 1 tablet by mouth once a day at bedtime  #90 x 1   Entered and Authorized by:   D. Thomos Lemons DO   Signed by:   D. Thomos Lemons DO on 01/16/2011   Method used:   Electronically to        [facility] (retail)       [address]       [place], [place]  [address]       Ph: [phone]       Fax: [phone]   RxID:   [id number]    Orders Added: 1)   T-Basic Metabolic Panel [id number] 2)  T- Hemoglobin A1C [[id number]] 3)  Est. Patient Level III [[address]]    Current Allergies (reviewed today): ! FLOMAX Updated/Current Medications (including changes made in today's visit):  SIMVASTATIN 20 MG  TABS (SIMVASTATIN) Take 1 tablet by mouth once a day at bedtime FISH OIL 1000 MG  CAPS (OMEGA-3 FATTY ACIDS) Take 1 tablet by mouth once a day VITAMIN C 500 MG  TABS (ASCORBIC ACID) Take 1 tablet by mouth once a day MULTIVITAMINS   TABS (MULTIPLE VITAMIN) Take 1 tablet by mouth once a day ONETOUCH ULTRA TEST   STRP (GLUCOSE BLOOD) for once daily testing ASPIRIN 81 MG TBEC (ASPIRIN) 1 once daily CALCIUM 500 MG TABS (CALCIUM) 1 two times a day
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
## 2011-05-05 ENCOUNTER — Encounter: Payer: Self-pay | Admitting: Family Medicine

## 2011-05-05 ENCOUNTER — Ambulatory Visit (INDEPENDENT_AMBULATORY_CARE_PROVIDER_SITE_OTHER): Payer: PRIVATE HEALTH INSURANCE | Admitting: Family Medicine

## 2011-05-05 VITALS — BP 126/67 | HR 72 | Temp 98.7°F | Ht 67.0 in | Wt 149.0 lb

## 2011-05-05 DIAGNOSIS — Z23 Encounter for immunization: Secondary | ICD-10-CM

## 2011-05-05 DIAGNOSIS — J209 Acute bronchitis, unspecified: Secondary | ICD-10-CM | POA: Insufficient documentation

## 2011-05-05 MED ORDER — TETANUS-DIPHTH-ACELL PERTUSSIS 5-2.5-18.5 LF-MCG/0.5 IM SUSP: 0.5000 mL | Freq: Once | INTRAMUSCULAR | Status: DC

## 2011-05-05 MED ORDER — AZITHROMYCIN 250 MG PO TABS: 250.0000 mg | ORAL_TABLET | Freq: Every day | ORAL | Status: AC

## 2011-05-05 MED ORDER — FLUTICASONE-SALMETEROL 115-21 MCG/ACT IN AERO: 2.0000 | INHALATION_SPRAY | Freq: Two times a day (BID) | RESPIRATORY_TRACT | Status: DC

## 2011-05-05 NOTE — Assessment & Plan Note (Signed)
Prolonged. Azithromycin x 5d rx'd today. Sample of advair 115/21, 2 puffs bid.  Inhaler technique demonstrated.  Therapeutic expectations and side effect profile of medication discussed today.  Patient's questions answered. Mucinex DM OTC prn. TdaP given today.  He has already had pneumovax.

## 2011-05-05 NOTE — Patient Instructions (Signed)
You may buy over the counter medication like mucinex DM and take 1 tab every 12 hours as needed for cough.

## 2011-05-05 NOTE — Progress Notes (Signed)
OFFICE NOTE  05/05/2011  CC:  Chief Complaint  Patient presents with  . Cough    dry, x 85month, no meds     HPI:   Patient is a 70 y.o. [place] male who is here for cough. Onset about 1 month ago, dry, associated with feeling of tightness in chest.  No SOB or wheezing noted. NO fever.  Mild decrease in appetite and energy level.  No known sick contacts.  No new aeroallergens or irritants. No tobacco.  No OTC cough meds have been tried.  Pertinent PMH:  No hx of chronic lung disease. +HTN +Hyperlipidemia +BPH +Borderline DM--diet controlled.  MEDS;  ASA, simvastatin, and vitamins. No outpatient prescriptions prior to visit.   No facility-administered medications prior to visit.    PE: Blood pressure 126/67, pulse 72, temperature 98.7 F (37.1 C), temperature source Oral, height 5\' 7"  (1.702 m), weight 149 lb (67.586 kg), SpO2 97.00%. Gen: Alert, well appearing.  Patient is oriented to person, place, time, and situation. HEENT: Scalp without lesions or hair loss.  Ears: EACs clear, normal epithelium.  TMs with good light reflex and landmarks bilaterally.  Eyes: no injection, icteris, swelling, or exudate.  EOMI, PERRLA. Nose: no drainage or turbinate edema/swelling.  No injection or focal lesion.  Mouth: lips without lesion/swelling.  Oral mucosa pink and moist.  Dentition intact and without obvious caries or gingival swelling.  Oropharynx without erythema, exudate, or swelling.  Neck: supple, ROM full.  Carotids 2+ bilat, without bruit.  No lymphadenopathy, thyromegaly, or mass. Chest: symmetric expansion, nonlabored respirations.  Clear and equal breath sounds in all lung fields.   CV: RRR, no m/r/g.  Peripheral pulses 2+ and symmetric. EXT: no clubbing, cyanosis, or edema.    IMPRESSION AND PLAN:  Acute bronchitis Prolonged. Azithromycin x 5d rx'd today. Sample of advair 115/21, 2 puffs bid.  Inhaler technique demonstrated.  Therapeutic expectations and side effect  profile of medication discussed today.  Patient's questions answered. Mucinex DM OTC prn. TdaP given today.  He has already had pneumovax.   Two week sample of advair HFA given.  He may d/c this med when sample is finished.  FOLLOW UP:  Return if symptoms worsen or fail to improve.

## 2011-07-09 ENCOUNTER — Ambulatory Visit: Payer: PRIVATE HEALTH INSURANCE | Admitting: Internal Medicine

## 2011-07-09 ENCOUNTER — Ambulatory Visit (INDEPENDENT_AMBULATORY_CARE_PROVIDER_SITE_OTHER): Payer: PRIVATE HEALTH INSURANCE | Admitting: Internal Medicine

## 2011-07-09 ENCOUNTER — Encounter: Payer: Self-pay | Admitting: Internal Medicine

## 2011-07-09 ENCOUNTER — Telehealth: Payer: Self-pay | Admitting: Internal Medicine

## 2011-07-09 VITALS — BP 108/70 | HR 64 | Temp 97.5°F | Resp 18 | Ht 67.0 in | Wt 148.0 lb

## 2011-07-09 DIAGNOSIS — E785 Hyperlipidemia, unspecified: Secondary | ICD-10-CM

## 2011-07-09 DIAGNOSIS — Z86010 Personal history of colonic polyps: Secondary | ICD-10-CM

## 2011-07-09 DIAGNOSIS — D126 Benign neoplasm of colon, unspecified: Secondary | ICD-10-CM

## 2011-07-09 DIAGNOSIS — K635 Polyp of colon: Secondary | ICD-10-CM

## 2011-07-09 DIAGNOSIS — R05 Cough: Secondary | ICD-10-CM

## 2011-07-09 DIAGNOSIS — R7309 Other abnormal glucose: Secondary | ICD-10-CM

## 2011-07-09 NOTE — Assessment & Plan Note (Signed)
Resume advair. Followup if no improvement or worsening.

## 2011-07-09 NOTE — Assessment & Plan Note (Signed)
Obtain lipid/lft. 

## 2011-07-09 NOTE — Assessment & Plan Note (Signed)
Obtain chem7 and a1c 

## 2011-07-09 NOTE — Telephone Encounter (Signed)
PLEASE SEND LAB ORDER FOR 9-14 AND AN ADDITIONAL ORDER FOR LAB THE WEEK OF  November 26 FOR THE LAB PRIOR TO 3 MONTH FOLLOW UP/[facility]

## 2011-07-09 NOTE — Patient Instructions (Signed)
Please schedule cbc, chem7, a1c, urine microalbumin (250.0) and lipid/lft (272.4) for next Friday 9/14 Please schedule chem7, a1c (250.0) prior to next visit

## 2011-07-09 NOTE — Progress Notes (Signed)
  Subjective:    Patient ID: Marvin Mcdonald, male    DOB: 17-Mar-1941, 70 y.o.   MRN: [id number]  HPI Pt presents to clinic for followup of multiple medical problems. fsbs typically under good control. Past 2 days mildy higher. No obvious infxs sxs. Notes chronic intermittent NP dry cough without f/c, dyspnea or wheezing. H/o colon polyps due for colonoscopy. Denies paresthesias of feet. Tolerates statin tx without myalgias or abn lft. No chest pain or abd pain.  Past Medical History  Diagnosis Date  . Diabetes mellitus   . Hyperlipidemia   . Low back pain   . Osteoporosis   . BPH (benign prostatic hypertrophy)   . Colon polyps    Past Surgical History  Procedure Date  . Hand surgery     reports that he has never smoked. He has never used smokeless tobacco. He reports that he does not drink alcohol or use illicit drugs. family history is not on file. Allergies  Allergen Reactions  . Tamsulosin      Review of Systems see hpi     Objective:   Physical Exam  Physical Exam  Nursing note and vitals reviewed. Constitutional: Appears well-developed and well-nourished. No distress.  HENT:  Head: Normocephalic and atraumatic.  Right Ear: External ear normal.  Left Ear: External ear normal.  Eyes: Conjunctivae are normal. No scleral icterus.  Neck: Neck supple. Carotid bruit is not present.  Cardiovascular: Normal rate, regular rhythm and normal heart sounds.  Exam reveals no gallop and no friction rub.   No murmur heard. Pulmonary/Chest: Effort normal and breath sounds normal. No respiratory distress. He has no wheezes. no rales.  Lymphadenopathy:    He has no cervical adenopathy.  Neurological:Alert.  Skin: Skin is warm and dry. Not diaphoretic.  Psychiatric: Has a normal mood and affect.   Diabetic foot exam: +2 DP pulses, no diabetic wounds, ulcerations or significant callousing. Monofilament exam nl.      Assessment & Plan:

## 2011-07-09 NOTE — Assessment & Plan Note (Signed)
Refer for f/u colonoscopy 

## 2011-07-10 NOTE — Telephone Encounter (Signed)
Lab orders have been entered for [facility]. Future orders entered for 09/28/2011.

## 2011-07-17 ENCOUNTER — Ambulatory Visit: Payer: Medicare Other | Admitting: Internal Medicine

## 2011-07-17 LAB — CBC
MCH: 34.1 pg — ABNORMAL HIGH (ref 26.0–34.0)
MCHC: 33.8 g/dL (ref 30.0–36.0)
MCV: 101 fL — ABNORMAL HIGH (ref 78.0–100.0)
Platelets: 172 10*3/uL (ref 150–400)

## 2011-07-17 LAB — LIPID PANEL
Cholesterol: 139 mg/dL (ref 0–200)
Triglycerides: 149 mg/dL (ref <150)
VLDL: 30 mg/dL (ref 0–40)

## 2011-07-17 LAB — BASIC METABOLIC PANEL
CO2: 22 mEq/L (ref 19–32)
Calcium: 9.2 mg/dL (ref 8.4–10.5)
Creat: 0.82 mg/dL (ref 0.50–1.35)
Sodium: 139 mEq/L (ref 135–145)

## 2011-07-17 LAB — HEPATIC FUNCTION PANEL
ALT: 35 U/L (ref 0–53)
AST: 31 U/L (ref 0–37)
Alkaline Phosphatase: 69 U/L (ref 39–117)
Bilirubin, Direct: 0.2 mg/dL (ref 0.0–0.3)
Indirect Bilirubin: 0.7 mg/dL (ref 0.0–0.9)
Total Protein: 7.6 g/dL (ref 6.0–8.3)

## 2011-07-17 LAB — HEMOGLOBIN A1C: Mean Plasma Glucose: 128 mg/dL — ABNORMAL HIGH (ref <117)

## 2011-07-18 LAB — MICROALBUMIN / CREATININE URINE RATIO
Creatinine, Urine: 14.3 mg/dL
Microalb Creat Ratio: 35 mg/g — ABNORMAL HIGH (ref 0.0–30.0)
Microalb, Ur: 0.5 mg/dL (ref 0.00–1.89)

## 2011-07-20 ENCOUNTER — Ambulatory Visit: Payer: PRIVATE HEALTH INSURANCE | Admitting: Internal Medicine

## 2011-07-27 ENCOUNTER — Telehealth: Payer: Self-pay | Admitting: *Deleted

## 2011-07-27 NOTE — Telephone Encounter (Signed)
Called pt to verify if his daughter was coming with him to his previsit or if he needed an interpretor scheduled.  He states his daughter is coming with him

## 2011-07-28 ENCOUNTER — Ambulatory Visit (AMBULATORY_SURGERY_CENTER): Payer: PRIVATE HEALTH INSURANCE

## 2011-07-28 VITALS — Ht 68.0 in | Wt 150.2 lb

## 2011-07-28 DIAGNOSIS — Z86010 Personal history of colonic polyps: Secondary | ICD-10-CM

## 2011-07-28 MED ORDER — PEG-KCL-NACL-NASULF-NA ASC-C 100 G PO SOLR: 1.0000 | Freq: Once | ORAL | Status: AC

## 2011-07-28 NOTE — Progress Notes (Signed)
Pt understands little [place]. ''Esmond Harps" (daughter) came to pre-visit to explain paperwork and will be at the appt for his colonoscopy on 08/11/11 with Dr Marina Goodell. Ulis Rias RN

## 2011-08-11 ENCOUNTER — Ambulatory Visit (AMBULATORY_SURGERY_CENTER): Payer: PRIVATE HEALTH INSURANCE | Admitting: Internal Medicine

## 2011-08-11 ENCOUNTER — Encounter: Payer: Self-pay | Admitting: Internal Medicine

## 2011-08-11 VITALS — BP 131/75 | HR 63 | Temp 96.4°F | Resp 12 | Ht 68.0 in | Wt 150.0 lb

## 2011-08-11 DIAGNOSIS — Z1211 Encounter for screening for malignant neoplasm of colon: Secondary | ICD-10-CM

## 2011-08-11 DIAGNOSIS — D126 Benign neoplasm of colon, unspecified: Secondary | ICD-10-CM

## 2011-08-11 DIAGNOSIS — Z860100 Personal history of colon polyps, unspecified: Secondary | ICD-10-CM

## 2011-08-11 MED ORDER — SODIUM CHLORIDE 0.9 % IV SOLN: 500.0000 mL | INTRAVENOUS | Status: DC

## 2011-08-11 NOTE — Patient Instructions (Signed)
Discharge instructions given with verbal understanding. Handout on polyps given. Resume previous medications. 

## 2011-08-12 ENCOUNTER — Telehealth: Payer: Self-pay

## 2011-08-12 NOTE — Telephone Encounter (Signed)
No ID on answering machine. 

## 2011-08-19 ENCOUNTER — Telehealth: Payer: Self-pay | Admitting: Internal Medicine

## 2011-08-19 NOTE — Telephone Encounter (Signed)
Patients daughter Farmer Mccahill called requesting lab results, from beginning of September, to be mailed to patient.

## 2011-08-19 NOTE — Telephone Encounter (Signed)
Lab results from 07/10/2011, printed and mailed to patients address on file.

## 2011-10-07 ENCOUNTER — Encounter: Payer: Self-pay | Admitting: Internal Medicine

## 2011-10-07 ENCOUNTER — Telehealth: Payer: Self-pay | Admitting: Internal Medicine

## 2011-10-07 ENCOUNTER — Ambulatory Visit (INDEPENDENT_AMBULATORY_CARE_PROVIDER_SITE_OTHER): Payer: PRIVATE HEALTH INSURANCE | Admitting: Internal Medicine

## 2011-10-07 DIAGNOSIS — E785 Hyperlipidemia, unspecified: Secondary | ICD-10-CM

## 2011-10-07 DIAGNOSIS — R7309 Other abnormal glucose: Secondary | ICD-10-CM

## 2011-10-07 DIAGNOSIS — E119 Type 2 diabetes mellitus without complications: Secondary | ICD-10-CM

## 2011-10-07 DIAGNOSIS — D7589 Other specified diseases of blood and blood-forming organs: Secondary | ICD-10-CM

## 2011-10-07 LAB — BASIC METABOLIC PANEL
CO2: 24 mEq/L (ref 19–32)
Chloride: 106 mEq/L (ref 96–112)
Creat: 0.92 mg/dL (ref 0.50–1.35)
Potassium: 4.3 mEq/L (ref 3.5–5.3)

## 2011-10-07 LAB — HEMOGLOBIN A1C: Mean Plasma Glucose: 123 mg/dL — ABNORMAL HIGH (ref <117)

## 2011-10-07 NOTE — Assessment & Plan Note (Signed)
Obtain chem7 and a1c. Repeat urine microalbumin with next visit

## 2011-10-07 NOTE — Progress Notes (Signed)
  Subjective:    Patient ID: Marvin Mcdonald, male    DOB: 1940-11-09, 70 y.o.   MRN: [id number]  HPI Pt presents to clinic for followup of multiple medical problems. Hx obtained with assistance from pt's daughter who participates with his permission. Denies active complaint. Underwent colonoscopy since last visit with finding of 4 polyps with 5y recommended f/u. BP reviwed nl. Last a1c 6.1 up from 5.9 and has gained 4lbs since last visit. Urine microalbumin ratio was minimally elevated.  Past Medical History  Diagnosis Date  . Hyperlipidemia   . Low back pain   . Osteoporosis   . BPH (benign prostatic hypertrophy)   . Colon polyps   . Diabetes mellitus     diet and exercise controlled   Past Surgical History  Procedure Date  . Hand surgery   . Polypectomy   . Colonoscopy     reports that he quit smoking about 15 years ago. His smoking use included Cigarettes. He has never used smokeless tobacco. He reports that he does not drink alcohol or use illicit drugs. family history is negative for Colon cancer, and Stomach cancer, and Esophageal cancer, . No Known Allergies   Review of Systems see hpi     Objective:   Physical Exam  Physical Exam  Nursing note and vitals reviewed. Constitutional: Appears well-developed and well-nourished. No distress.  HENT:  Head: Normocephalic and atraumatic.  Right Ear: External ear normal.  Left Ear: External ear normal.  Eyes: Conjunctivae are normal. No scleral icterus.  Neck: Neck supple. Carotid bruit is not present.  Cardiovascular: Normal rate, regular rhythm and normal heart sounds.  Exam reveals no gallop and no friction rub.   No murmur heard. Pulmonary/Chest: Effort normal and breath sounds normal. No respiratory distress. He has no wheezes. no rales.  Lymphadenopathy:    He has no cervical adenopathy.  Neurological:Alert.  Skin: Skin is warm and dry. Not diaphoretic.  Psychiatric: Has a normal mood and affect.        Assessment  & Plan:

## 2011-10-07 NOTE — Telephone Encounter (Signed)
Lab orders entered for March 2013. 

## 2011-10-07 NOTE — Patient Instructions (Signed)
Please schedule lipid/lft 272.4, chem7, a1c and urine microalbumin 250.0 prior to next visit

## 2011-10-07 NOTE — Assessment & Plan Note (Signed)
Stable. Obtain lipid/lft prior to next visit. 

## 2011-10-07 NOTE — Assessment & Plan Note (Signed)
Obtain b12 level.  

## 2011-12-29 ENCOUNTER — Telehealth: Payer: Self-pay | Admitting: Internal Medicine

## 2011-12-30 ENCOUNTER — Telehealth: Payer: Self-pay | Admitting: Internal Medicine

## 2011-12-30 MED ORDER — SIMVASTATIN 20 MG PO TABS: 20.0000 mg | ORAL_TABLET | Freq: Every day | ORAL | Status: DC

## 2011-12-30 NOTE — Telephone Encounter (Signed)
Rx refill addressed 

## 2011-12-30 NOTE — Telephone Encounter (Signed)
Rx refill sent to pharmacy. 

## 2012-01-05 ENCOUNTER — Other Ambulatory Visit: Payer: Self-pay | Admitting: *Deleted

## 2012-01-05 DIAGNOSIS — E785 Hyperlipidemia, unspecified: Secondary | ICD-10-CM

## 2012-01-05 DIAGNOSIS — R7309 Other abnormal glucose: Secondary | ICD-10-CM

## 2012-01-05 LAB — BASIC METABOLIC PANEL
BUN: 11 mg/dL (ref 6–23)
Calcium: 9 mg/dL (ref 8.4–10.5)
Chloride: 106 mEq/L (ref 96–112)
Creat: 0.77 mg/dL (ref 0.50–1.35)

## 2012-01-05 LAB — LIPID PANEL
Cholesterol: 127 mg/dL (ref 0–200)
HDL: 35 mg/dL — ABNORMAL LOW (ref >39)
LDL Cholesterol: 67 mg/dL (ref 0–99)
Triglycerides: 123 mg/dL (ref <150)

## 2012-01-05 LAB — HEMOGLOBIN A1C
Hgb A1c MFr Bld: 6.1 % — ABNORMAL HIGH (ref <5.7)
Mean Plasma Glucose: 128 mg/dL — ABNORMAL HIGH (ref <117)

## 2012-01-05 LAB — HEPATIC FUNCTION PANEL
ALT: 43 U/L (ref 0–53)
Albumin: 4.5 g/dL (ref 3.5–5.2)
Alkaline Phosphatase: 83 U/L (ref 39–117)
Total Protein: 7.3 g/dL (ref 6.0–8.3)

## 2012-01-06 LAB — MICROALBUMIN / CREATININE URINE RATIO: Microalb Creat Ratio: 37 mg/g — ABNORMAL HIGH (ref 0.0–30.0)

## 2012-01-11 ENCOUNTER — Ambulatory Visit (INDEPENDENT_AMBULATORY_CARE_PROVIDER_SITE_OTHER): Payer: PRIVATE HEALTH INSURANCE | Admitting: Internal Medicine

## 2012-01-11 ENCOUNTER — Telehealth: Payer: Self-pay | Admitting: Internal Medicine

## 2012-01-11 ENCOUNTER — Encounter: Payer: Self-pay | Admitting: Internal Medicine

## 2012-01-11 DIAGNOSIS — R7309 Other abnormal glucose: Secondary | ICD-10-CM | POA: Diagnosis not present

## 2012-01-11 DIAGNOSIS — E785 Hyperlipidemia, unspecified: Secondary | ICD-10-CM

## 2012-01-11 DIAGNOSIS — R809 Proteinuria, unspecified: Secondary | ICD-10-CM

## 2012-01-11 NOTE — Telephone Encounter (Signed)
Lab orders entered for June 2013. 

## 2012-01-11 NOTE — Assessment & Plan Note (Signed)
Good control without current medication. Discussed mildly elevated urine microalbumin. Consider possible low dose ace inhibitor but BP low nl. Repeat urine microalbumin with next visit and if persistently elevated reconsider ace inhibitor.

## 2012-01-11 NOTE — Assessment & Plan Note (Signed)
ldl at goal. Continue current statin dose. Encouraged regular aerobic exercise to improve hdl. Obtain hdl with next visit labs.

## 2012-01-11 NOTE — Patient Instructions (Signed)
Please schedule labs prior to next visit Cbc, chem7, a1c-250.0, HDL-dyslipidemia and urine microalbuminuria -proteinuria

## 2012-01-11 NOTE — Progress Notes (Signed)
  Subjective:    Patient ID: Marvin Mcdonald, male    DOB: [birth year], 71 y.o.   MRN: [id number]  HPI Pt presents to clinic for followup of multiple medical problems. No complaints. Reviewed mildly depressed hdl. Urine microalbumin slightly elevated despite good a1c. Family believes diabetic eye exam performed within 12 months but will check records at home. No active complaint.  Past Medical History  Diagnosis Date  . Hyperlipidemia   . Low back pain   . Osteoporosis   . BPH (benign prostatic hypertrophy)   . Colon polyps   . Diabetes mellitus     diet and exercise controlled   Past Surgical History  Procedure Date  . Hand surgery   . Polypectomy   . Colonoscopy     reports that he quit smoking about 15 years ago. His smoking use included Cigarettes. He has never used smokeless tobacco. He reports that he does not drink alcohol or use illicit drugs. family history is negative for Colon cancer, and Stomach cancer, and Esophageal cancer, . No Known Allergies    Review of Systems see hpi     Objective:   Physical Exam  Physical Exam  Nursing note and vitals reviewed. Constitutional: Appears well-developed and well-nourished. No distress.  HENT:  Head: Normocephalic and atraumatic.  Right Ear: External ear normal.  Left Ear: External ear normal.  Eyes: Conjunctivae are normal. No scleral icterus.  Neck: Neck supple. Carotid bruit is not present.  Cardiovascular: Normal rate, regular rhythm and normal heart sounds.  Exam reveals no gallop and no friction rub.   No murmur heard. Pulmonary/Chest: Effort normal and breath sounds normal. No respiratory distress. He has no wheezes. no rales.  Neurological:Alert.  Skin: Skin is warm and dry. Not diaphoretic.  Psychiatric: Has a normal mood and affect.        Assessment & Plan:

## 2012-04-05 LAB — BASIC METABOLIC PANEL
BUN: 9 mg/dL (ref 6–23)
CO2: 25 mEq/L (ref 19–32)
Chloride: 106 mEq/L (ref 96–112)
Glucose, Bld: 84 mg/dL (ref 70–99)
Potassium: 3.8 mEq/L (ref 3.5–5.3)

## 2012-04-05 NOTE — Telephone Encounter (Signed)
Pt presented to the lab, order released. 

## 2012-04-05 NOTE — Telephone Encounter (Signed)
Addended by: Mervin Kung A on: 04/05/2012 11:34 AM   Modules accepted: Orders

## 2012-04-06 LAB — MICROALBUMIN / CREATININE URINE RATIO
Creatinine, Urine: 23.3 mg/dL
Microalb Creat Ratio: 21.5 mg/g (ref 0.0–30.0)
Microalb, Ur: 0.5 mg/dL (ref 0.00–1.89)

## 2012-04-06 LAB — HEMOGLOBIN A1C: Hgb A1c MFr Bld: 5.8 % — ABNORMAL HIGH (ref <5.7)

## 2012-04-06 LAB — CBC
HCT: 40 % (ref 39.0–52.0)
Hemoglobin: 13.3 g/dL (ref 13.0–17.0)
MCHC: 33.3 g/dL (ref 30.0–36.0)
RBC: 3.93 MIL/uL — ABNORMAL LOW (ref 4.22–5.81)

## 2012-04-15 ENCOUNTER — Ambulatory Visit (INDEPENDENT_AMBULATORY_CARE_PROVIDER_SITE_OTHER): Payer: PRIVATE HEALTH INSURANCE | Admitting: Internal Medicine

## 2012-04-15 ENCOUNTER — Encounter: Payer: Self-pay | Admitting: Internal Medicine

## 2012-04-15 ENCOUNTER — Telehealth: Payer: Self-pay | Admitting: Internal Medicine

## 2012-04-15 VITALS — BP 110/72 | HR 73 | Temp 97.5°F | Resp 16 | Ht 67.0 in | Wt 150.0 lb

## 2012-04-15 DIAGNOSIS — E785 Hyperlipidemia, unspecified: Secondary | ICD-10-CM

## 2012-04-15 DIAGNOSIS — I10 Essential (primary) hypertension: Secondary | ICD-10-CM

## 2012-04-15 DIAGNOSIS — R7309 Other abnormal glucose: Secondary | ICD-10-CM

## 2012-04-15 DIAGNOSIS — E119 Type 2 diabetes mellitus without complications: Secondary | ICD-10-CM

## 2012-04-15 MED ORDER — SIMVASTATIN 20 MG PO TABS: 20.0000 mg | ORAL_TABLET | Freq: Every day | ORAL | Status: DC

## 2012-04-15 NOTE — Telephone Encounter (Signed)
Lab orders entered for September 2013. 

## 2012-04-15 NOTE — Patient Instructions (Signed)
Please schedule fasting labs prior to next visit Chem7, a1c-250.0 and lipid/lft-272.4 

## 2012-04-15 NOTE — Telephone Encounter (Signed)
Chem7, a1c-250.0 and lipid/lft 272.4

## 2012-04-30 NOTE — Assessment & Plan Note (Signed)
Stable. Refill zocor

## 2012-04-30 NOTE — Assessment & Plan Note (Signed)
Normotensive control.

## 2012-04-30 NOTE — Progress Notes (Signed)
  Subjective:    Patient ID: Marvin Mcdonald, male    DOB: Mar 08, 1941, 71 y.o.   MRN: [id number]  HPI Pt presents to clinic for followup of multiple medical problems. Tolerating statin tx. BP reviewed normotensive. a1c improved to 5.8.  Past Medical History  Diagnosis Date  . Hyperlipidemia   . Low back pain   . Osteoporosis   . BPH (benign prostatic hypertrophy)   . Colon polyps   . Diabetes mellitus     diet and exercise controlled   Past Surgical History  Procedure Date  . Hand surgery   . Polypectomy   . Colonoscopy     reports that he quit smoking about 15 years ago. His smoking use included Cigarettes. He has never used smokeless tobacco. He reports that he does not drink alcohol or use illicit drugs. family history is negative for Colon cancer, and Stomach cancer, and Esophageal cancer, . No Known Allergies    Review of Systems see hpi     Objective:   Physical Exam  Physical Exam  Nursing note and vitals reviewed. Constitutional: Appears well-developed and well-nourished. No distress.  HENT:  Head: Normocephalic and atraumatic.  Right Ear: External ear normal.  Left Ear: External ear normal.  Eyes: Conjunctivae are normal. No scleral icterus.  Neck: Neck supple. Carotid bruit is not present.  Cardiovascular: Normal rate, regular rhythm and normal heart sounds.  Exam reveals no gallop and no friction rub.   No murmur heard. Pulmonary/Chest: Effort normal and breath sounds normal. No respiratory distress. He has no wheezes. no rales.  Lymphadenopathy:    He has no cervical adenopathy.  Neurological:Alert.  Skin: Skin is warm and dry. Not diaphoretic.  Psychiatric: Has a normal mood and affect.        Assessment & Plan:

## 2012-04-30 NOTE — Assessment & Plan Note (Signed)
Excellent control. Continue appropriate diet.

## 2012-08-08 NOTE — Addendum Note (Signed)
Addended by: Anselm Jungling on: 08/08/2012 01:55 PM   Modules accepted: Orders

## 2012-08-09 LAB — BASIC METABOLIC PANEL
BUN: 12 mg/dL (ref 6–23)
Calcium: 8.8 mg/dL (ref 8.4–10.5)
Chloride: 105 mEq/L (ref 96–112)
Creat: 0.82 mg/dL (ref 0.50–1.35)

## 2012-08-09 LAB — HEPATIC FUNCTION PANEL
AST: 20 U/L (ref 0–37)
Bilirubin, Direct: 0.1 mg/dL (ref 0.0–0.3)
Indirect Bilirubin: 0.6 mg/dL (ref 0.0–0.9)
Total Bilirubin: 0.7 mg/dL (ref 0.3–1.2)

## 2012-08-09 LAB — LIPID PANEL: Total CHOL/HDL Ratio: 4.1 Ratio

## 2012-08-09 LAB — HEMOGLOBIN A1C: Mean Plasma Glucose: 128 mg/dL — ABNORMAL HIGH (ref <117)

## 2012-08-18 ENCOUNTER — Ambulatory Visit: Payer: Medicare Other | Admitting: Internal Medicine

## 2012-08-26 ENCOUNTER — Ambulatory Visit: Payer: Medicare Other | Admitting: Internal Medicine

## 2012-09-01 ENCOUNTER — Ambulatory Visit: Payer: Medicare Other | Admitting: Internal Medicine

## 2012-09-02 ENCOUNTER — Encounter: Payer: Self-pay | Admitting: Internal Medicine

## 2012-09-02 ENCOUNTER — Ambulatory Visit (INDEPENDENT_AMBULATORY_CARE_PROVIDER_SITE_OTHER): Payer: PRIVATE HEALTH INSURANCE | Admitting: Internal Medicine

## 2012-09-02 VITALS — BP 128/72 | HR 66 | Temp 97.9°F | Resp 14 | Wt 152.2 lb

## 2012-09-02 DIAGNOSIS — R7309 Other abnormal glucose: Secondary | ICD-10-CM

## 2012-09-02 DIAGNOSIS — I10 Essential (primary) hypertension: Secondary | ICD-10-CM | POA: Diagnosis not present

## 2012-09-02 DIAGNOSIS — E785 Hyperlipidemia, unspecified: Secondary | ICD-10-CM

## 2012-09-02 NOTE — Patient Instructions (Signed)
Please schedule labs prior to next visit Chem7, a1c-250.00 

## 2012-09-04 NOTE — Assessment & Plan Note (Signed)
Stable good control. 

## 2012-09-04 NOTE — Assessment & Plan Note (Signed)
Good control. Continue statin therapy. 

## 2012-09-04 NOTE — Progress Notes (Signed)
  Subjective:    Patient ID: Marvin Mcdonald, male    DOB: Nov 04, 1940, 71 y.o.   MRN: [id number]  HPI Pt presents to clinic for followup of multiple medical problems. Blood pressure reviewed as normotensive. States diabetic eye exam up to date. Denies neuropathic changes of feet. Weight stable. Reviewed A1c remaining under good control. Last Pneumovax documented two thousand seven but states believes has obtained booster since that time.   Past Medical History  Diagnosis Date  . Hyperlipidemia   . Low back pain   . Osteoporosis   . BPH (benign prostatic hypertrophy)   . Colon polyps   . Diabetes mellitus     diet and exercise controlled   Past Surgical History  Procedure Date  . Hand surgery   . Polypectomy   . Colonoscopy     reports that he quit smoking about 16 years ago. His smoking use included Cigarettes. He has never used smokeless tobacco. He reports that he does not drink alcohol or use illicit drugs. family history is negative for Colon cancer, and Stomach cancer, and Esophageal cancer, . No Known Allergies   Review of Systems see hpi      Objective:   Physical Exam  Physical Exam  Nursing note and vitals reviewed. Constitutional: Appears well-developed and well-nourished. No distress.  HENT:  Head: Normocephalic and atraumatic.  Right Ear: External ear normal.  Left Ear: External ear normal.  Eyes: Conjunctivae are normal. No scleral icterus.  Neck: Neck supple. Carotid bruit is not present.  Cardiovascular: Normal rate, regular rhythm and normal heart sounds.  Exam reveals no gallop and no friction rub.   No murmur heard. Pulmonary/Chest: Effort normal and breath sounds normal. No respiratory distress. He has no wheezes. no rales.  Lymphadenopathy:    He has no cervical adenopathy.  Neurological:Alert.  Skin: Skin is warm and dry. Not diaphoretic.  Psychiatric: Has a normal mood and affect.  Diabetic foot exam: +2 DP pulses, no diabetic wounds, ulcerations  or significant callousing. Monofilament exam nl.       Assessment & Plan:

## 2012-09-04 NOTE — Assessment & Plan Note (Signed)
Normotensive and stable. Continue current regimen. Monitor bp as outpt and followup in clinic as scheduled.  

## 2012-12-27 ENCOUNTER — Telehealth: Payer: Self-pay

## 2012-12-27 LAB — BASIC METABOLIC PANEL
CO2: 22 mEq/L (ref 19–32)
Calcium: 9 mg/dL (ref 8.4–10.5)
Glucose, Bld: 81 mg/dL (ref 70–99)
Potassium: 3.9 mEq/L (ref 3.5–5.3)
Sodium: 138 mEq/L (ref 135–145)

## 2012-12-27 LAB — HEMOGLOBIN A1C: Mean Plasma Glucose: 123 mg/dL — ABNORMAL HIGH (ref <117)

## 2012-12-27 NOTE — Telephone Encounter (Signed)
Patient came in today for labs. Labs ordered  

## 2013-01-03 ENCOUNTER — Telehealth: Payer: Self-pay | Admitting: *Deleted

## 2013-01-03 ENCOUNTER — Ambulatory Visit (INDEPENDENT_AMBULATORY_CARE_PROVIDER_SITE_OTHER): Payer: PRIVATE HEALTH INSURANCE | Admitting: Family Medicine

## 2013-01-03 ENCOUNTER — Encounter: Payer: Self-pay | Admitting: Family Medicine

## 2013-01-03 VITALS — BP 138/72 | HR 78 | Temp 97.4°F | Ht 67.0 in | Wt 154.0 lb

## 2013-01-03 DIAGNOSIS — R7309 Other abnormal glucose: Secondary | ICD-10-CM

## 2013-01-03 DIAGNOSIS — J209 Acute bronchitis, unspecified: Secondary | ICD-10-CM

## 2013-01-03 DIAGNOSIS — E785 Hyperlipidemia, unspecified: Secondary | ICD-10-CM

## 2013-01-03 DIAGNOSIS — I10 Essential (primary) hypertension: Secondary | ICD-10-CM

## 2013-01-03 DIAGNOSIS — J069 Acute upper respiratory infection, unspecified: Secondary | ICD-10-CM

## 2013-01-03 HISTORY — DX: Acute upper respiratory infection, unspecified: J06.9

## 2013-01-03 MED ORDER — DOXYCYCLINE HYCLATE 100 MG PO TABS: 100.0000 mg | ORAL_TABLET | Freq: Two times a day (BID) | ORAL | Status: DC

## 2013-01-03 NOTE — Patient Instructions (Addendum)
Next visit annual with labs prior lipid, renal, hepatic, tsh, hgba1c, microalb, UA  For upper respiratory infection try Xicam OTC as directed on package Mucinex 600 mg caps 1 cap twice a day for 10 days and start a Probiotic such as Digestive Advantage or Align daily during acute illness and when ever taking an antibiotic   Upper Respiratory Infection, Adult An upper respiratory infection (URI) is also known as the common cold. It is often caused by a type of germ (virus). Colds are easily spread (contagious). You can pass it to others by kissing, coughing, sneezing, or drinking out of the same glass. Usually, you get better in 1 or 2 weeks.  HOME CARE   Only take medicine as told by your doctor.  Use a warm mist humidifier or breathe in steam from a hot shower.  Drink enough water and fluids to keep your pee (urine) clear or pale yellow.  Get plenty of rest.  Return to work when your temperature is back to normal or as told by your doctor. You may use a face mask and wash your hands to stop your cold from spreading. GET HELP RIGHT AWAY IF:   After the first few days, you feel you are getting worse.  You have questions about your medicine.  You have chills, shortness of breath, or brown or red spit (mucus).  You have yellow or brown snot (nasal discharge) or pain in the face, especially when you bend forward.  You have a fever, puffy (swollen) neck, pain when you swallow, or white spots in the back of your throat.  You have a bad headache, ear pain, sinus pain, or chest pain.  You have a high-pitched whistling sound when you breathe in and out (wheezing).  You have a lasting cough or cough up blood.  You have sore muscles or a stiff neck. MAKE SURE YOU:   Understand these instructions.  Will watch your condition.  Will get help right away if you are not doing well or get worse. Document Released: 04/06/2008 Document Revised: 01/11/2012 Document Reviewed:  02/23/2011 [facility] Patient Information 2013 [place], [place].

## 2013-01-03 NOTE — Assessment & Plan Note (Signed)
hgba1c 5.9, minimize simple carbs.

## 2013-01-03 NOTE — Assessment & Plan Note (Signed)
Avoid trans fats, consider switching from fish to krill oil caps

## 2013-01-03 NOTE — Telephone Encounter (Signed)
Office Message [address] [place], [place] [address] p. [phone] f. [phone] To: Lorrin Mais Fax: [phone] From: [facility] Date/ Time: 01/03/2013 8:56 AM Taken By: Jethro BolusEsmond Harps Facility: not collected Patient: Marvin Mcdonald, Marvin Mcdonald DOB: 05/31/1941 Phone: [phone] Reason for Call: Has a 4p appt. today as a f/u OV and she is wanting same to be earlier today if at all possible. I checked the schedule, offered the 11am that is open but she declined. Wanted something around 2p. She will keep the 4pm appt. time. DMM/CAN Regarding Appointment: Appt Date: Appt Time: Unknown

## 2013-01-03 NOTE — Progress Notes (Signed)
Patient ID: Marvin Mcdonald, male   DOB: [birth year], 72 y.o.   MRN: [id number] Marvin Mcdonald [id number] Dec 07, 1940 01/03/2013      Progress Note-Follow Up  Subjective  Chief Complaint  Chief Complaint  Patient presents with  . Follow-up    4 month  . Cough    runny nose X 2 days    HPI  Patient is a 72 year old Asian male in today for followup. Has been struggling with mild cough with clear rhinorrhea for the past 2 days. Notes some chest congestion just today. No shortness of breath or wheezing. No headache, ear pain, throat pain. No GI complaints. No diarrhea or anorexia. Has noted some fevers, chills, malaise, myalgias.  Past Medical History  Diagnosis Date  . Hyperlipidemia   . Low back pain   . Osteoporosis   . BPH (benign prostatic hypertrophy)   . Colon polyps   . Diabetes mellitus     diet and exercise controlled    Past Surgical History  Procedure Laterality Date  . Hand surgery    . Polypectomy    . Colonoscopy      Family History  Problem Relation Age of Onset  . Colon cancer Neg Hx   . Stomach cancer Neg Hx   . Esophageal cancer Neg Hx     History   Social History  . Marital Status: Married    Spouse Name: Young    Number of Children: N/A  . Years of Education: N/A   Occupational History  .     Social History Main Topics  . Smoking status: Former Smoker    Types: Cigarettes    Quit date: 07/27/1996  . Smokeless tobacco: Never Used  . Alcohol Use: No  . Drug Use: No  . Sexually Active: Not on file   Other Topics Concern  . Not on file   Social History Narrative   Supportive daughter    Current Outpatient Prescriptions on File Prior to Visit  Medication Sig Dispense Refill  . aspirin 81 MG tablet Take 81 mg by mouth daily.        . Calcium Carbonate (CALCIUM 500 PO) Take by mouth 2 (two) times daily.        . Ginger, Zingiber officinalis, (GINGER ROOT PO) Take by mouth.        . Multiple Vitamin (MULTIVITAMIN) capsule Take 1 capsule  by mouth daily.        . Omega-3 Fatty Acids (FISH OIL) 1000 MG CAPS Take 1 capsule by mouth daily.        . simvastatin (ZOCOR) 20 MG tablet Take 1 tablet (20 mg total) by mouth at bedtime.  90 tablet  2  . vitamin C (ASCORBIC ACID) 500 MG tablet Take 500 mg by mouth daily.         No current facility-administered medications on file prior to visit.    No Known Allergies  Review of Systems  Review of Systems  Constitutional: Negative for fever and malaise/fatigue.  HENT: Positive for congestion. Negative for sore throat.   Eyes: Negative for discharge.  Respiratory: Positive for cough and sputum production. Negative for shortness of breath.   Cardiovascular: Negative for chest pain, palpitations and leg swelling.  Gastrointestinal: Negative for nausea, abdominal pain and diarrhea.  Genitourinary: Negative for dysuria.  Musculoskeletal: Negative for falls.  Skin: Negative for rash.  Neurological: Negative for loss of consciousness and headaches.  Endo/Heme/Allergies: Negative for polydipsia.  Psychiatric/Behavioral: Negative for depression and  suicidal ideas. The patient is not nervous/anxious and does not have insomnia.     Objective  BP 138/72  Pulse 78  Temp(Src) 97.4 F (36.3 C) (Oral)  Ht 5\' 7"  (1.702 m)  Wt 154 lb (69.854 kg)  BMI 24.11 kg/m2  SpO2 97%  Physical Exam  Physical Exam  Constitutional: He is oriented to person, place, and time and well-developed, well-nourished, and in no distress. No distress.  HENT:  Head: Normocephalic and atraumatic.  Eyes: Conjunctivae are normal.  Neck: Neck supple. No thyromegaly present.  Cardiovascular: Normal rate and regular rhythm.  Exam reveals no gallop.   No murmur heard. Pulmonary/Chest: Effort normal and breath sounds normal. No respiratory distress.  Abdominal: He exhibits no distension and no mass. There is no tenderness.  Musculoskeletal: He exhibits no edema.  Neurological: He is alert and oriented to person,  place, and time.  Skin: Skin is warm.  Psychiatric: Memory, affect and judgment normal.    Lab Results  Component Value Date   TSH 1.467 08/29/2010   Lab Results  Component Value Date   WBC 5.0 04/05/2012   HGB 13.3 04/05/2012   HCT 40.0 04/05/2012   MCV 101.8* 04/05/2012   PLT 174 04/05/2012   Lab Results  Component Value Date   CREATININE 0.84 12/27/2012   BUN 16 12/27/2012   NA 138 12/27/2012   K 3.9 12/27/2012   CL 102 12/27/2012   CO2 22 12/27/2012   Lab Results  Component Value Date   ALT 24 08/08/2012   AST 20 08/08/2012   ALKPHOS 61 08/08/2012   BILITOT 0.7 08/08/2012   Lab Results  Component Value Date   CHOL 127 08/08/2012   Lab Results  Component Value Date   HDL 31* 08/08/2012   Lab Results  Component Value Date   LDLCALC 64 08/08/2012   Lab Results  Component Value Date   TRIG 161* 08/08/2012   Lab Results  Component Value Date   CHOLHDL 4.1 08/08/2012     Assessment & Plan  HYPERTENSION Adequately well controlled, no changes.  DIABETES MELLITUS, TYPE II, BORDERLINE hgba1c 5.9, minimize simple carbs.  HYPERLIPIDEMIA Avoid trans fats, consider switching from fish to krill oil caps  Acute URI Mild, encouraged Xicam, Mucinex, probiotics, increased rest and hydration. If symptoms worsen or do not improve then can start Doxycycline 100 mg po bid

## 2013-01-03 NOTE — Assessment & Plan Note (Signed)
Mild, encouraged Xicam, Mucinex, probiotics, increased rest and hydration. If symptoms worsen or do not improve then can start Doxycycline 100 mg po bid

## 2013-01-03 NOTE — Assessment & Plan Note (Signed)
Adequately well controlled, no changes.

## 2013-01-05 ENCOUNTER — Ambulatory Visit: Payer: Medicare Other | Admitting: Family Medicine

## 2013-01-06 ENCOUNTER — Ambulatory Visit: Payer: Medicare Other | Admitting: Internal Medicine

## 2013-03-28 ENCOUNTER — Ambulatory Visit (HOSPITAL_BASED_OUTPATIENT_CLINIC_OR_DEPARTMENT_OTHER)
Admission: RE | Admit: 2013-03-28 | Discharge: 2013-03-28 | Disposition: A | Discharge status: Home / Self Care | Payer: PRIVATE HEALTH INSURANCE | Source: Ambulatory Visit | Attending: Nurse Practitioner | Admitting: Nurse Practitioner

## 2013-03-28 ENCOUNTER — Ambulatory Visit (INDEPENDENT_AMBULATORY_CARE_PROVIDER_SITE_OTHER): Payer: PRIVATE HEALTH INSURANCE | Admitting: Nurse Practitioner

## 2013-03-28 VITALS — BP 116/74 | HR 72 | Resp 12

## 2013-03-28 DIAGNOSIS — M4180 Other forms of scoliosis, site unspecified: Secondary | ICD-10-CM | POA: Insufficient documentation

## 2013-03-28 DIAGNOSIS — R319 Hematuria, unspecified: Secondary | ICD-10-CM | POA: Diagnosis not present

## 2013-03-28 DIAGNOSIS — W19XXXA Unspecified fall, initial encounter: Secondary | ICD-10-CM

## 2013-03-28 DIAGNOSIS — M5137 Other intervertebral disc degeneration, lumbosacral region: Secondary | ICD-10-CM | POA: Insufficient documentation

## 2013-03-28 DIAGNOSIS — H01119 Allergic dermatitis of unspecified eye, unspecified eyelid: Secondary | ICD-10-CM

## 2013-03-28 DIAGNOSIS — M51379 Other intervertebral disc degeneration, lumbosacral region without mention of lumbar back pain or lower extremity pain: Secondary | ICD-10-CM | POA: Insufficient documentation

## 2013-03-28 DIAGNOSIS — R21 Rash and other nonspecific skin eruption: Secondary | ICD-10-CM | POA: Diagnosis not present

## 2013-03-28 DIAGNOSIS — IMO0002 Reserved for concepts with insufficient information to code with codable children: Secondary | ICD-10-CM | POA: Insufficient documentation

## 2013-03-28 LAB — POCT URINALYSIS DIPSTICK
Leukocytes, UA: NEGATIVE
Nitrite, UA: NEGATIVE
Protein, UA: NEGATIVE
Urobilinogen, UA: 0.2
pH, UA: 5.5

## 2013-03-28 MED ORDER — METHOCARBAMOL 500 MG PO TABS: 500.0000 mg | ORAL_TABLET | Freq: Every evening | ORAL | Status: DC | PRN

## 2013-03-28 MED ORDER — PREDNISONE 10 MG PO TABS: 10.0000 mg | ORAL_TABLET | Freq: Every day | ORAL | Status: DC

## 2013-03-28 NOTE — Patient Instructions (Addendum)
I am treating the skin on the eyes & neck for contact dermatitis with prednisone. Take early in day as it can keep you awake. Cool packs or ice packs will help with swelling & itching (on for 5-7 minutes several times daily). Hopefully the back pain is only muscle strain, but the xrays will rule out vertebral or pelvic fracture. I will call with the results once I receive them. Ibuprophen 200 - 400 mg twice daily for a few days can decrease pain and help with inflammation. The muscle relaxer can help at night time if sleep is interrupted due to pain. Ice pack on the strained muscles is helpful in the 1st 24 hours. After that, you may use heat alternating with ice for several days. Please schedule a follow up to re-evaluate in about 10 days. Feel better!

## 2013-03-29 ENCOUNTER — Telehealth: Payer: Self-pay | Admitting: Nurse Practitioner

## 2013-03-29 ENCOUNTER — Encounter: Payer: Self-pay | Admitting: Nurse Practitioner

## 2013-03-29 LAB — URINALYSIS, MICROSCOPIC ONLY
Bacteria, UA: NONE SEEN
Casts: NONE SEEN

## 2013-03-29 NOTE — Telephone Encounter (Signed)
See telephone note.

## 2013-03-29 NOTE — Progress Notes (Signed)
Subjective:    Patient ID: Marvin Mcdonald, male    DOB: 1941/05/28, 72 y.o.   MRN: [id number]  Fall The accident occurred 12 to 24 hours ago. The fall occurred from a ladder (landed on sacrum). He fell from a height of 3 to 5 ft. He landed on grass. There was no blood loss. The point of impact was the buttocks. The pain is present in the back. The pain is at a severity of 4/10. The symptoms are aggravated by movement and pressure on injury. Pertinent negatives include no abdominal pain, headaches, hematuria, loss of consciousness, nausea, numbness, visual change or vomiting. He has tried ice and rest for the symptoms. The treatment provided mild relief.  Rash This is a new problem. The current episode started today. The problem has been gradually worsening since onset. The affected locations include the left eye, face and right eye. The rash is characterized by itchiness, redness and swelling. He was exposed to plant contact (not sure of plant, but was working in yard). Pertinent negatives include no cough, shortness of breath or vomiting. Past treatments include nothing. There is no history of allergies.      Review of Systems  Eyes: Positive for itching (eyelids are swollen & red). Negative for redness.  Respiratory: Negative for cough, chest tightness, shortness of breath and wheezing.   Cardiovascular: Negative for chest pain.  Gastrointestinal: Negative for nausea, vomiting and abdominal pain.  Genitourinary: Positive for flank pain. Negative for dysuria, frequency, hematuria, scrotal swelling, penile pain and testicular pain.  Musculoskeletal: Positive for back pain (thoracic & lumbar pain after fall). Negative for gait problem.  Skin: Positive for rash (eyelids are swollen & pink, also has papular rash on neck ).  Allergic/Immunologic: Negative for environmental allergies.  Neurological: Negative for loss of consciousness, weakness, numbness and headaches.       Objective:   Physical  Exam  Constitutional: He is oriented to person, place, and time. He appears well-developed and well-nourished.  Pt accompanied by daughter Esmond Harps, who translated all communication between pt & provider  HENT:  Head: Normocephalic and atraumatic.  Eyes: Conjunctivae and EOM are normal. Pupils are equal, round, and reactive to light. No foreign bodies found. Right eye exhibits no chemosis and no discharge. Left eye exhibits no chemosis and no discharge.  Neck: Normal range of motion.  Cardiovascular: Normal rate, regular rhythm and normal heart sounds.   No murmur heard. Pulmonary/Chest: Effort normal and breath sounds normal. No respiratory distress. He has no wheezes.  Abdominal: Soft. He exhibits no mass. There is no tenderness. There is no guarding.  Musculoskeletal: Normal range of motion. He exhibits tenderness (thoracic spine point tenderness, bilateral lateral thoracic & lumbar tenderness,lumbar area covered in OTC pain patches. removed patch over lspine-no bruising noted.).  Neurological: He is alert and oriented to person, place, and time.  Skin: Skin is warm and dry. Rash (puffy eyelids, pink, papular rash L neck under collar) noted.          Assessment & Plan:  1. Fall, initial encounter Impact at sacrum. - DG Thoracic Spine 2 View; Future - DG Lumbar Spine Complete; Future - DG Pelvis 1-2 Views; Future - POCT urinalysis dipstick - methocarbamol (ROBAXIN) 500 MG tablet; Take 1 tablet (500 mg total) by mouth at bedtime as needed.  Dispense: 10 tablet; Refill: 0 - Urine Microscopic Only  2. Dermatitis contact, eyelid, unspecified laterality - predniSONE (DELTASONE) 10 MG tablet; Take 1 tablet (10 mg total) by mouth  daily. Take 4 tabs po qd for 3 d, then 3 T po qd for 3d, then 2T po qd for 3 days, then 1 T po qd for 3 d, then discontinue.  Dispense: 30 tablet; Refill: 0  3. Blood in urine No prev test for comparison, not certain if r/t fall. Repeat at follow up. Pt instructed to  call if develops abdominal pain or nausea. - Urine Microscopic Only

## 2013-04-07 ENCOUNTER — Encounter: Payer: Self-pay | Admitting: Family Medicine

## 2013-04-07 ENCOUNTER — Ambulatory Visit (INDEPENDENT_AMBULATORY_CARE_PROVIDER_SITE_OTHER): Payer: PRIVATE HEALTH INSURANCE | Admitting: Family Medicine

## 2013-04-07 VITALS — BP 118/62 | HR 74 | Temp 97.9°F | Ht 67.0 in | Wt 150.1 lb

## 2013-04-07 DIAGNOSIS — R7309 Other abnormal glucose: Secondary | ICD-10-CM | POA: Diagnosis not present

## 2013-04-07 DIAGNOSIS — I10 Essential (primary) hypertension: Secondary | ICD-10-CM

## 2013-04-07 DIAGNOSIS — R319 Hematuria, unspecified: Secondary | ICD-10-CM | POA: Diagnosis not present

## 2013-04-07 DIAGNOSIS — M549 Dorsalgia, unspecified: Secondary | ICD-10-CM

## 2013-04-07 HISTORY — DX: Hematuria, unspecified: R31.9

## 2013-04-07 MED ORDER — BACLOFEN 10 MG PO TABS: ORAL_TABLET | ORAL | Status: DC

## 2013-04-07 NOTE — Assessment & Plan Note (Signed)
Mild repeat  Labs in 3 months, minimize simple carbs

## 2013-04-07 NOTE — Patient Instructions (Addendum)
Salon pas patches or gel  Back Pain, Adult Low back pain is very common. About 1 in 5 people have back pain.The cause of low back pain is rarely dangerous. The pain often gets better over time.About half of people with a sudden onset of back pain feel better in just 2 weeks. About 8 in 10 people feel better by 6 weeks.  CAUSES Some common causes of back pain include:  Strain of the muscles or ligaments supporting the spine.  Wear and tear (degeneration) of the spinal discs.  Arthritis.  Direct injury to the back. DIAGNOSIS Most of the time, the direct cause of low back pain is not known.However, back pain can be treated effectively even when the exact cause of the pain is unknown.Answering your caregiver's questions about your overall health and symptoms is one of the most accurate ways to make sure the cause of your pain is not dangerous. If your caregiver needs more information, he or she may order lab work or imaging tests (X-rays or MRIs).However, even if imaging tests show changes in your back, this usually does not require surgery. HOME CARE INSTRUCTIONS For many people, back pain returns.Since low back pain is rarely dangerous, it is often a condition that people can learn to [facility] their own.   Remain active. It is stressful on the back to sit or stand in one place. Do not sit, drive, or stand in one place for more than 30 minutes at a time. Take short walks on level surfaces as soon as pain allows.Try to increase the length of time you walk each day.  Do not stay in bed.Resting more than 1 or 2 days can delay your recovery.  Do not avoid exercise or work.Your body is made to move.It is not dangerous to be active, even though your back may hurt.Your back will likely heal faster if you return to being active before your pain is gone.  Pay attention to your body when you bend and lift. Many people have less discomfortwhen lifting if they bend their knees, keep the load  close to their bodies,and avoid twisting. Often, the most comfortable positions are those that put less stress on your recovering back.  Find a comfortable position to sleep. Use a firm mattress and lie on your side with your knees slightly bent. If you lie on your back, put a pillow under your knees.  Only take over-the-counter or prescription medicines as directed by your caregiver. Over-the-counter medicines to reduce pain and inflammation are often the most helpful.Your caregiver may prescribe muscle relaxant drugs.These medicines help dull your pain so you can more quickly return to your normal activities and healthy exercise.  Put ice on the injured area.  Put ice in a plastic bag.  Place a towel between your skin and the bag.  Leave the ice on for 15-20 minutes, 3-4 times a day for the first 2 to 3 days. After that, ice and heat may be alternated to reduce pain and spasms.  Ask your caregiver about trying back exercises and gentle massage. This may be of some benefit.  Avoid feeling anxious or stressed.Stress increases muscle tension and can worsen back pain.It is important to recognize when you are anxious or stressed and learn ways to manage it.Exercise is a great option. SEEK MEDICAL CARE IF:  You have pain that is not relieved with rest or medicine.  You have pain that does not improve in 1 week.  You have new symptoms.  You  are generally not feeling well. SEEK IMMEDIATE MEDICAL CARE IF:   You have pain that radiates from your back into your legs.  You develop new bowel or bladder control problems.  You have unusual weakness or numbness in your arms or legs.  You develop nausea or vomiting.  You develop abdominal pain.  You feel faint. Document Released: 10/19/2005 Document Revised: 04/19/2012 Document Reviewed: 03/09/2011 [facility] Patient Information 2014 [place], [place].

## 2013-04-07 NOTE — Progress Notes (Signed)
Patient ID: Marvin Mcdonald, male   DOB: 01/11/1941, 72 y.o.   MRN: [id number] Marvin Mcdonald [id number] Dec 02, 1940 04/07/2013      Progress Note-Follow Up  Subjective  Chief Complaint  Chief Complaint  Patient presents with  . Follow-up    10 day f/up on back pain from fall- saw Layne on 03-28-13    HPI  The patient is a 72 year old Asian male in today for reevaluation of back pain. He fell off a ladder in his home on 03/28/2013 and was seen in evaluation then. Is given some mild muscle relaxer which she says is very mildly helpful. Otherwise she's had no new concerns. He denies any GI or GU complaints. His back pain is improving. No falls, numbness, tingling or incontinence. He otherwise feels well without any chest pain, palpitations, shortness of breath.   Past Medical History  Diagnosis Date  . Hyperlipidemia   . Low back pain   . Osteoporosis   . BPH (benign prostatic hypertrophy)   . Colon polyps   . Diabetes mellitus     diet and exercise controlled  . Acute URI 01/03/2013  . Hematuria 04/07/2013    Past Surgical History  Procedure Laterality Date  . Hand surgery    . Polypectomy    . Colonoscopy      Family History  Problem Relation Age of Onset  . Colon cancer Neg Hx   . Stomach cancer Neg Hx   . Esophageal cancer Neg Hx     History   Social History  . Marital Status: Married    Spouse Name: Young    Number of Children: N/A  . Years of Education: N/A   Occupational History  .     Social History Main Topics  . Smoking status: Former Smoker    Types: Cigarettes    Quit date: 07/27/1996  . Smokeless tobacco: Never Used  . Alcohol Use: No  . Drug Use: No  . Sexually Active: Not on file   Other Topics Concern  . Not on file   Social History Narrative   Supportive daughter    Current Outpatient Prescriptions on File Prior to Visit  Medication Sig Dispense Refill  . aspirin 81 MG tablet Take 81 mg by mouth daily.        . Calcium Carbonate (CALCIUM  500 PO) Take by mouth 2 (two) times daily.        . Ginger, Zingiber officinalis, (GINGER ROOT PO) Take by mouth.        . Multiple Vitamin (MULTIVITAMIN) capsule Take 1 capsule by mouth daily.        . simvastatin (ZOCOR) 20 MG tablet Take 1 tablet (20 mg total) by mouth at bedtime.  90 tablet  2  . vitamin C (ASCORBIC ACID) 500 MG tablet Take 500 mg by mouth daily.         No current facility-administered medications on file prior to visit.    No Known Allergies  Review of Systems  Review of Systems  Constitutional: Negative for fever and malaise/fatigue.  HENT: Negative for congestion.   Eyes: Negative for discharge.  Respiratory: Negative for shortness of breath.   Cardiovascular: Negative for chest pain, palpitations and leg swelling.  Gastrointestinal: Negative for nausea, abdominal pain and diarrhea.  Genitourinary: Negative for dysuria.  Musculoskeletal: Positive for back pain. Negative for falls.  Skin: Negative for rash.  Neurological: Negative for loss of consciousness and headaches.  Endo/Heme/Allergies: Negative for polydipsia.  Psychiatric/Behavioral:  Negative for depression and suicidal ideas. The patient is not nervous/anxious and does not have insomnia.     Objective  BP 118/62  Pulse 74  Temp(Src) 97.9 F (36.6 C) (Oral)  Ht 5\' 7"  (1.702 m)  Wt 150 lb 1.3 oz (68.076 kg)  BMI 23.5 kg/m2  SpO2 97%  Physical Exam  Physical Exam  Constitutional: He is oriented to person, place, and time and well-developed, well-nourished, and in no distress. No distress.  HENT:  Head: Normocephalic and atraumatic.  Eyes: Conjunctivae are normal.  Neck: Neck supple. No thyromegaly present.  Cardiovascular: Normal rate, regular rhythm and normal heart sounds.   No murmur heard. Pulmonary/Chest: Effort normal and breath sounds normal. No respiratory distress.  Abdominal: Soft. Bowel sounds are normal. He exhibits no distension and no mass. There is no tenderness.   Musculoskeletal: He exhibits tenderness. He exhibits no edema.  Mild pain with palp over lateral, left paravertebral muscles in lower thoracic upper lumbar region. No bony tenderness  Neurological: He is alert and oriented to person, place, and time.  Skin: Skin is warm.  Psychiatric: Memory, affect and judgment normal.    Lab Results  Component Value Date   TSH 1.467 08/29/2010   Lab Results  Component Value Date   WBC 5.0 04/05/2012   HGB 13.3 04/05/2012   HCT 40.0 04/05/2012   MCV 101.8* 04/05/2012   PLT 174 04/05/2012   Lab Results  Component Value Date   CREATININE 0.84 12/27/2012   BUN 16 12/27/2012   NA 138 12/27/2012   K 3.9 12/27/2012   CL 102 12/27/2012   CO2 22 12/27/2012   Lab Results  Component Value Date   ALT 24 08/08/2012   AST 20 08/08/2012   ALKPHOS 61 08/08/2012   BILITOT 0.7 08/08/2012   Lab Results  Component Value Date   CHOL 127 08/08/2012   Lab Results  Component Value Date   HDL 31* 08/08/2012   Lab Results  Component Value Date   LDLCALC 64 08/08/2012   Lab Results  Component Value Date   TRIG 161* 08/08/2012   Lab Results  Component Value Date   CHOLHDL 4.1 08/08/2012     Assessment & Plan  Hematuria Trace after a fall. Repeat UA in 1 month  DIABETES MELLITUS, TYPE II, BORDERLINE Mild repeat  Labs in 3 months, minimize simple carbs  HYPERTENSION Well controlled on current meds

## 2013-04-07 NOTE — Assessment & Plan Note (Signed)
Well-controlled on current meds 

## 2013-04-07 NOTE — Assessment & Plan Note (Signed)
Trace after a fall. Repeat UA in 1 month

## 2013-05-11 ENCOUNTER — Other Ambulatory Visit: Payer: Self-pay

## 2013-05-19 ENCOUNTER — Telehealth: Payer: Self-pay | Admitting: *Deleted

## 2013-05-19 NOTE — Telephone Encounter (Signed)
With written authorization, medical records RE: Labs & last OV notes "to fulfill all program requirements", documents faxed to [facility] at [phone]/SLS

## 2013-07-04 ENCOUNTER — Ambulatory Visit: Payer: PRIVATE HEALTH INSURANCE | Admitting: Family Medicine

## 2013-09-22 ENCOUNTER — Telehealth: Payer: Self-pay | Admitting: Family Medicine

## 2013-09-22 MED ORDER — SIMVASTATIN 20 MG PO TABS: 20.0000 mg | ORAL_TABLET | Freq: Every day | ORAL | Status: DC

## 2013-09-22 NOTE — Telephone Encounter (Signed)
Rx request to pharmacy/SLS  

## 2013-09-22 NOTE — Telephone Encounter (Signed)
Refill simvastatin 20 mg tablet qty 90 take 1 tablet by mouth at bedtime last fill 05-18-2013

## 2013-09-25 ENCOUNTER — Other Ambulatory Visit: Payer: Self-pay | Admitting: Internal Medicine

## 2013-09-25 NOTE — Telephone Encounter (Signed)
eScribe request for refill on OneTouch Test Strips Last filled - Never; Not on medication list Last AEX - 06.06.14 Please Advise as this has never been shown in patient's historical record/SLS

## 2013-10-05 ENCOUNTER — Encounter: Payer: Self-pay | Admitting: Family Medicine

## 2013-10-05 ENCOUNTER — Ambulatory Visit (INDEPENDENT_AMBULATORY_CARE_PROVIDER_SITE_OTHER): Payer: Medicare Other | Admitting: Family Medicine

## 2013-10-05 VITALS — BP 142/78 | HR 73 | Temp 98.0°F | Ht 67.0 in | Wt 154.0 lb

## 2013-10-05 DIAGNOSIS — Z23 Encounter for immunization: Secondary | ICD-10-CM | POA: Diagnosis not present

## 2013-10-05 DIAGNOSIS — E119 Type 2 diabetes mellitus without complications: Secondary | ICD-10-CM

## 2013-10-05 DIAGNOSIS — I10 Essential (primary) hypertension: Secondary | ICD-10-CM | POA: Diagnosis not present

## 2013-10-05 DIAGNOSIS — M25519 Pain in unspecified shoulder: Secondary | ICD-10-CM

## 2013-10-05 DIAGNOSIS — K044 Acute apical periodontitis of pulpal origin: Secondary | ICD-10-CM

## 2013-10-05 DIAGNOSIS — E079 Disorder of thyroid, unspecified: Secondary | ICD-10-CM

## 2013-10-05 DIAGNOSIS — R7309 Other abnormal glucose: Secondary | ICD-10-CM

## 2013-10-05 DIAGNOSIS — R221 Localized swelling, mass and lump, neck: Secondary | ICD-10-CM

## 2013-10-05 DIAGNOSIS — R220 Localized swelling, mass and lump, head: Secondary | ICD-10-CM

## 2013-10-05 DIAGNOSIS — K047 Periapical abscess without sinus: Secondary | ICD-10-CM

## 2013-10-05 LAB — CBC
MCV: 97.3 fL (ref 78.0–100.0)
Platelets: 188 10*3/uL (ref 150–400)
RBC: 4.13 MIL/uL — ABNORMAL LOW (ref 4.22–5.81)
WBC: 5.8 10*3/uL (ref 4.0–10.5)

## 2013-10-05 LAB — RENAL FUNCTION PANEL
Albumin: 4.2 g/dL (ref 3.5–5.2)
Calcium: 9.1 mg/dL (ref 8.4–10.5)
Glucose, Bld: 105 mg/dL — ABNORMAL HIGH (ref 70–99)
Sodium: 140 mEq/L (ref 135–145)

## 2013-10-05 LAB — TSH: TSH: 2.355 u[IU]/mL (ref 0.350–4.500)

## 2013-10-05 LAB — LIPID PANEL
Cholesterol: 162 mg/dL (ref 0–200)
HDL: 35 mg/dL — ABNORMAL LOW (ref >39)
LDL Cholesterol: 58 mg/dL (ref 0–99)
Triglycerides: 343 mg/dL — ABNORMAL HIGH (ref <150)

## 2013-10-05 LAB — HEPATIC FUNCTION PANEL
ALT: 39 U/L (ref 0–53)
Albumin: 4.2 g/dL (ref 3.5–5.2)
Bilirubin, Direct: 0.1 mg/dL (ref 0.0–0.3)

## 2013-10-05 MED ORDER — METHYLPREDNISOLONE (PAK) 4 MG PO TABS: ORAL_TABLET | ORAL | Status: DC

## 2013-10-05 MED ORDER — METHOCARBAMOL 500 MG PO TABS: 500.0000 mg | ORAL_TABLET | Freq: Every evening | ORAL | Status: DC | PRN

## 2013-10-05 NOTE — Patient Instructions (Signed)
Soak feet in warm water and distilled white vinegar each night then apply Vick's Vapor Rub to toenails nightly For the shoulder pain may apply Salon Pas, Icy Hot or Aspercreme  Ringworm, Nail A fungal infection of the nail (tinea unguium/onychomycosis) is common. It is common as the visible part of the nail is composed of dead cells which have no blood supply to help prevent infection. It occurs because fungi are everywhere and will pick any opportunity to grow on any dead material. Because nails are very slow growing they require up to 2 years of treatment with anti-fungal medications. The entire nail back to the base is infected. This includes approximately  of the nail which you cannot see. If your caregiver has prescribed a medication by mouth, take it every day and as directed. No progress will be seen for at least 6 to 9 months. Do not be disappointed! Because fungi live on dead cells with little or no exposure to blood supply, medication delivery to the infection is slow; thus the cure is slow. It is also why you can observe no progress in the first 6 months. The nail becoming cured is the base of the nail, as it has the blood supply. Topical medication such as creams and ointments are usually not effective. Important in successful treatment of nail fungus is closely following the medication regimen that your doctor prescribes. Sometimes you and your caregiver may elect to speed up this process by surgical removal of all the nails. Even this may still require 6 to 9 months of additional oral medications. See your caregiver as directed. Remember there will be no visible improvement for at least 6 months. See your caregiver sooner if other signs of infection (redness and swelling) develop. Document Released: 10/16/2000 Document Revised: 01/11/2012 Document Reviewed: 12/25/2008 [facility] Patient Information 2014 [place], [place].

## 2013-10-05 NOTE — Progress Notes (Signed)
Pre visit review using our clinic review tool, if applicable. No additional management support is needed unless otherwise documented below in the visit note. 

## 2013-10-06 ENCOUNTER — Ambulatory Visit (HOSPITAL_BASED_OUTPATIENT_CLINIC_OR_DEPARTMENT_OTHER)
Admission: RE | Admit: 2013-10-06 | Discharge: 2013-10-06 | Disposition: A | Discharge status: Home / Self Care | Payer: Medicare Other | Source: Ambulatory Visit | Attending: Family Medicine | Admitting: Family Medicine

## 2013-10-06 DIAGNOSIS — E079 Disorder of thyroid, unspecified: Secondary | ICD-10-CM

## 2013-10-06 DIAGNOSIS — E049 Nontoxic goiter, unspecified: Secondary | ICD-10-CM | POA: Diagnosis not present

## 2013-10-06 DIAGNOSIS — E041 Nontoxic single thyroid nodule: Secondary | ICD-10-CM | POA: Diagnosis not present

## 2013-10-07 ENCOUNTER — Encounter: Payer: Self-pay | Admitting: Family Medicine

## 2013-10-07 DIAGNOSIS — M25519 Pain in unspecified shoulder: Secondary | ICD-10-CM

## 2013-10-07 DIAGNOSIS — K047 Periapical abscess without sinus: Secondary | ICD-10-CM | POA: Insufficient documentation

## 2013-10-07 DIAGNOSIS — R221 Localized swelling, mass and lump, neck: Secondary | ICD-10-CM | POA: Insufficient documentation

## 2013-10-07 HISTORY — DX: Localized swelling, mass and lump, neck: R22.1

## 2013-10-07 HISTORY — DX: Periapical abscess without sinus: K04.7

## 2013-10-07 HISTORY — DX: Pain in unspecified shoulder: M25.519

## 2013-10-07 MED ORDER — AMOXICILLIN-POT CLAVULANATE 875-125 MG PO TABS: 1.0000 | ORAL_TABLET | Freq: Two times a day (BID) | ORAL | Status: DC

## 2013-10-07 NOTE — Assessment & Plan Note (Signed)
Left lower gum line, is encouraged to proceed with dental appt and given rx for Augmentin if iinfection worsens

## 2013-10-07 NOTE — Progress Notes (Signed)
Patient ID: Marvin Mcdonald, male   DOB: [birth year], 72 y.o.   MRN: [id number] Marvin Mcdonald [id number] Oct 24, 1941 10/07/2013      Progress Note-Follow Up  Subjective  Chief Complaint  Chief Complaint  Patient presents with  . Shoulder Pain    both shoulders X 1 month  . Oral Swelling    1 month  . Injections    prevnar    HPI  Patient is a 72 year old Asian male who is here today with his daughter. He speaks very little [place] as she interprets. He is complaining of worsening of shoulder pain over the last month. No acute fall or injury but he has had trouble with shoulder pain for years. Is also complaining of dental pain and swelling in the left lower,. Has not seen a dentist in quite some time. Does note some soreness in his neck as well and is concerned because years ago he was told he had some thyroid disease. No dysphagia or cough. No fevers or chills. No chest pain, palpitations or shortness of breath. No GI or GU complaints  Past Medical History  Diagnosis Date  . Hyperlipidemia   . Low back pain   . Osteoporosis   . BPH (benign prostatic hypertrophy)   . Colon polyps   . Diabetes mellitus     diet and exercise controlled  . Acute URI 01/03/2013  . Hematuria 04/07/2013  . Dental infection 10/07/2013  . Pain in joint, shoulder region 10/07/2013  . Mass of neck 10/07/2013    Past Surgical History  Procedure Laterality Date  . Hand surgery    . Polypectomy    . Colonoscopy      Family History  Problem Relation Age of Onset  . Colon cancer Neg Hx   . Stomach cancer Neg Hx   . Esophageal cancer Neg Hx     History   Social History  . Marital Status: Married    Spouse Name: Young    Number of Children: N/A  . Years of Education: N/A   Occupational History  .     Social History Main Topics  . Smoking status: Former Smoker    Types: Cigarettes    Quit date: 07/27/1996  . Smokeless tobacco: Never Used  . Alcohol Use: No  . Drug Use: No  . Sexual Activity:  Not on file   Other Topics Concern  . Not on file   Social History Narrative   Supportive daughter    Current Outpatient Prescriptions on File Prior to Visit  Medication Sig Dispense Refill  . aspirin 81 MG tablet Take 81 mg by mouth daily.        . Calcium Carbonate (CALCIUM 500 PO) Take by mouth 2 (two) times daily.        . Ginger, Zingiber officinalis, (GINGER ROOT PO) Take by mouth.        Marland Kitchen KRILL OIL PO Take by mouth.      . Multiple Vitamin (MULTIVITAMIN) capsule Take 1 capsule by mouth daily.        . ONE TOUCH ULTRA TEST test strip USE TO TEST BLOOD SUGAR EVERY DAY  100 each  3  . simvastatin (ZOCOR) 20 MG tablet Take 1 tablet (20 mg total) by mouth at bedtime.  90 tablet  0  . vitamin C (ASCORBIC ACID) 500 MG tablet Take 1,000 mg by mouth 2 (two) times daily.        No current facility-administered medications on file prior  to visit.    No Known Allergies  Review of Systems  Review of Systems  Constitutional: Negative for fever and malaise/fatigue.  HENT: Negative for congestion.   Eyes: Negative for discharge.  Respiratory: Negative for shortness of breath.   Cardiovascular: Negative for chest pain, palpitations and leg swelling.  Gastrointestinal: Negative for nausea, abdominal pain and diarrhea.  Genitourinary: Negative for dysuria.  Musculoskeletal: Positive for joint pain and myalgias. Negative for falls.  Skin: Negative for rash.  Neurological: Negative for loss of consciousness and headaches.  Endo/Heme/Allergies: Negative for polydipsia.  Psychiatric/Behavioral: Negative for depression and suicidal ideas. The patient is not nervous/anxious and does not have insomnia.     Objective  BP 142/78  Pulse 73  Temp(Src) 98 F (36.7 C) (Oral)  Ht 5\' 7"  (1.702 m)  Wt 154 lb (69.854 kg)  BMI 24.11 kg/m2  SpO2 96%  Physical Exam  Physical Exam  Constitutional: He is oriented to person, place, and time and well-developed, well-nourished, and in no  distress. No distress.  HENT:  Head: Normocephalic and atraumatic.  Slight swelling of gum left lower gum lline  Eyes: Conjunctivae are normal.  Neck: Neck supple. No thyromegaly present.  Cardiovascular: Normal rate, regular rhythm and normal heart sounds.   No murmur heard. Pulmonary/Chest: Effort normal and breath sounds normal. No respiratory distress.  Abdominal: He exhibits no distension and no mass. There is no tenderness.  Musculoskeletal: He exhibits no edema.  Neurological: He is alert and oriented to person, place, and time.  Skin: Skin is warm.  Psychiatric: Memory, affect and judgment normal.    Lab Results  Component Value Date   TSH 2.355 10/05/2013   Lab Results  Component Value Date   WBC 5.8 10/05/2013   HGB 14.2 10/05/2013   HCT 40.2 10/05/2013   MCV 97.3 10/05/2013   PLT 188 10/05/2013   Lab Results  Component Value Date   CREATININE 0.75 10/05/2013   BUN 12 10/05/2013   NA 140 10/05/2013   K 3.9 10/05/2013   CL 106 10/05/2013   CO2 24 10/05/2013   Lab Results  Component Value Date   ALT 39 10/05/2013   AST 28 10/05/2013   ALKPHOS 90 10/05/2013   BILITOT 0.3 10/05/2013   Lab Results  Component Value Date   CHOL 162 10/05/2013   Lab Results  Component Value Date   HDL 35* 10/05/2013   Lab Results  Component Value Date   LDLCALC 58 10/05/2013   Lab Results  Component Value Date   TRIG 343* 10/05/2013   Lab Results  Component Value Date   CHOLHDL 4.6 10/05/2013     Assessment & Plan  HYPERTENSION Mild elevation with pain, improved some with recheck, no changes  Pain in joint, shoulder region B/l worse in past month but present for over a year. Given medrol dose pak and Robaxin to use prn. 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 Radiology recommends CT of neck will proceed if patient agrees.   Dental infection Left lower gum line, is  encouraged to proceed with dental appt and given rx for Augmentin if iinfection worsens  DIABETES MELLITUS, TYPE II, BORDERLINE hgba1c 6.1, encouraged minimize simple carbs

## 2013-10-07 NOTE — Assessment & Plan Note (Signed)
hgba1c 6.1, encouraged minimize simple carbs

## 2013-10-07 NOTE — Assessment & Plan Note (Signed)
Mild elevation with pain, improved some with recheck, no changes

## 2013-10-07 NOTE — Assessment & Plan Note (Signed)
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 Radiology recommends CT of neck will proceed if patient agrees.

## 2013-10-07 NOTE — Assessment & Plan Note (Signed)
B/l worse in past month but present for over a year. Given medrol dose pak and Robaxin to use prn. Consider referral to sports med for eval if does not improve

## 2013-10-09 ENCOUNTER — Encounter: Payer: Self-pay | Admitting: Family Medicine

## 2013-10-09 ENCOUNTER — Telehealth: Payer: Self-pay | Admitting: Family Medicine

## 2013-10-09 DIAGNOSIS — Z0000 Encounter for general adult medical examination without abnormal findings: Secondary | ICD-10-CM

## 2013-10-09 NOTE — Telephone Encounter (Signed)
States someone called and left no msg, request results of [place]

## 2013-10-09 NOTE — Telephone Encounter (Signed)
Lab order placed.   Patients daughter would like to proceed with CT scan. Pts daughter informed that MD is out of the office until Friday morning, that she may be checking her desktop or might not be until Friday. Pts daughter voiced understanding

## 2013-10-09 NOTE — Telephone Encounter (Signed)
I did try to call and it states that you can't leave a message so results were mailed to pts home. I will however call the pts daughter.

## 2013-10-09 NOTE — Telephone Encounter (Signed)
So his ultrasound brought up questions, his ultrasound showed a lesion behind not in his thyroid. What is the question? I will order th CT tonight if that is what they want. This is my recommendation

## 2013-10-09 NOTE — Telephone Encounter (Signed)
Please call daughter Esmond Harps again she has additional questions [phone]

## 2013-11-13 ENCOUNTER — Ambulatory Visit: Payer: PRIVATE HEALTH INSURANCE | Admitting: Physician Assistant

## 2013-11-13 ENCOUNTER — Telehealth: Payer: Self-pay | Admitting: Family Medicine

## 2013-11-13 ENCOUNTER — Other Ambulatory Visit: Payer: Self-pay | Admitting: Family Medicine

## 2013-11-13 ENCOUNTER — Encounter: Payer: Self-pay | Admitting: Family Medicine

## 2013-11-13 ENCOUNTER — Ambulatory Visit (INDEPENDENT_AMBULATORY_CARE_PROVIDER_SITE_OTHER): Payer: Medicare Other | Admitting: Family Medicine

## 2013-11-13 VITALS — BP 120/74 | HR 72 | Temp 98.3°F | Ht 67.0 in | Wt 152.0 lb

## 2013-11-13 DIAGNOSIS — R05 Cough: Secondary | ICD-10-CM

## 2013-11-13 DIAGNOSIS — J09X2 Influenza due to identified novel influenza A virus with other respiratory manifestations: Secondary | ICD-10-CM

## 2013-11-13 DIAGNOSIS — I10 Essential (primary) hypertension: Secondary | ICD-10-CM

## 2013-11-13 DIAGNOSIS — R059 Cough, unspecified: Secondary | ICD-10-CM

## 2013-11-13 DIAGNOSIS — Z23 Encounter for immunization: Secondary | ICD-10-CM

## 2013-11-13 MED ORDER — BENZONATATE 200 MG PO CAPS: 200.0000 mg | ORAL_CAPSULE | Freq: Three times a day (TID) | ORAL | Status: DC | PRN

## 2013-11-13 MED ORDER — OSELTAMIVIR PHOSPHATE 75 MG PO CAPS: 75.0000 mg | ORAL_CAPSULE | Freq: Two times a day (BID) | ORAL | Status: DC

## 2013-11-13 NOTE — Patient Instructions (Addendum)
Mucinex or Robitussin twice daily Probiotics, Digestive Advantage daily Zinc daily   Upper Respiratory Infection, Adult An upper respiratory infection (URI) is also known as the common cold. It is often caused by a type of germ (virus). Colds are easily spread (contagious). You can pass it to others by kissing, coughing, sneezing, or drinking out of the same glass. Usually, you get better in 1 or 2 weeks.  HOME CARE   Only take medicine as told by your doctor.  Use a warm mist humidifier or breathe in steam from a hot shower.  Drink enough water and fluids to keep your pee (urine) clear or pale yellow.  Get plenty of rest.  Return to work when your temperature is back to normal or as told by your doctor. You may use a face mask and wash your hands to stop your cold from spreading. GET HELP RIGHT AWAY IF:   After the first few days, you feel you are getting worse.  You have questions about your medicine.  You have chills, shortness of breath, or brown or red spit (mucus).  You have yellow or brown snot (nasal discharge) or pain in the face, especially when you bend forward.  You have a fever, puffy (swollen) neck, pain when you swallow, or white spots in the back of your throat.  You have a bad headache, ear pain, sinus pain, or chest pain.  You have a high-pitched whistling sound when you breathe in and out (wheezing).  You have a lasting cough or cough up blood.  You have sore muscles or a stiff neck. MAKE SURE YOU:   Understand these instructions.  Will watch your condition.  Will get help right away if you are not doing well or get worse. Document Released: 04/06/2008 Document Revised: 01/11/2012 Document Reviewed: 02/23/2011 [facility] Patient Information 2014 [place], [place]. Influenza, Adult Influenza (flu) is an infection in the mouth, nose, and throat (respiratory tract) caused by a virus. The flu can make you feel very ill. Influenza spreads easily from person to  person (contagious).  HOME CARE   Only take medicines as told by your doctor.  Use a cool mist humidifier to make breathing easier.  Get plenty of rest until your fever goes away. This usually takes 3 to 4 days.  Drink enough fluids to keep your pee (urine) clear or pale yellow.  Cover your mouth and nose when you cough or sneeze.  Wash your hands well to avoid spreading the flu.  Stay home from work or school until your fever has been gone for at least 1 full day.  Get a flu shot every year. GET HELP RIGHT AWAY IF:   You have trouble breathing or feel short of breath.  Your skin or nails turn blue.  You have severe neck pain or stiffness.  You have a severe headache, facial pain, or earache.  Your fever gets worse or keeps coming back.  You feel sick to your stomach (nauseous), throw up (vomit), or have watery poop (diarrhea).  You have chest pain.  You have a deep cough that gets worse, or you cough up more thick spit (mucus). MAKE SURE YOU:   Understand these instructions.  Will watch your condition.  Will get help right away if you are not doing well or get worse. Document Released: 07/28/2008 Document Revised: 04/19/2012 Document Reviewed: 01/18/2012 [facility] Patient Information 2014 [place], [place].

## 2013-11-13 NOTE — Telephone Encounter (Signed)
Check with pharmacy and see if Tessalon 100 mg are on back order and if not use these 1-2 caps tid prn cough, dips #45 if cannot give these, suggest Tussionex 1 tsp po qhs prn cough but would not use during day due to sedation. Disp # 120 ml

## 2013-11-13 NOTE — Telephone Encounter (Signed)
benzonatate (TESSALON) 200 MG capsule is on back order, suggest another cough med?

## 2013-11-13 NOTE — Progress Notes (Signed)
Pre visit review using our clinic review tool, if applicable. No additional management support is needed unless otherwise documented below in the visit note. 

## 2013-11-14 MED ORDER — HYDROCOD POLST-CHLORPHEN POLST 10-8 MG/5ML PO LQCR: 5.0000 mL | Freq: Every evening | ORAL | Status: DC | PRN

## 2013-11-14 NOTE — Telephone Encounter (Signed)
Tessalon is on back order   RX faxed

## 2013-11-18 NOTE — Assessment & Plan Note (Signed)
Well controlled no changes 

## 2013-11-19 ENCOUNTER — Encounter: Payer: Self-pay | Admitting: Family Medicine

## 2013-11-19 DIAGNOSIS — J09X2 Influenza due to identified novel influenza A virus with other respiratory manifestations: Secondary | ICD-10-CM | POA: Insufficient documentation

## 2013-11-19 DIAGNOSIS — R05 Cough: Secondary | ICD-10-CM | POA: Insufficient documentation

## 2013-11-19 DIAGNOSIS — R059 Cough, unspecified: Secondary | ICD-10-CM | POA: Insufficient documentation

## 2013-11-19 NOTE — Progress Notes (Signed)
Patient ID: Azam Gervasi, male   DOB: 09-17-1941, 73 y.o.   MRN: [id number] Ples Trudel [id number] [birth year] 11/19/2013      Progress Note-Follow Up  Subjective  Chief Complaint  Chief Complaint  Patient presents with  . chest congestion    X 3 days  . Cough    dry- X 3 days    HPI  Patient is a 73 year old Asian male who is in today complaining of numerous symptoms. He has cough which is keeping him up at night and causing great discomfor. Chest congestion and malaise. His fevers myalgias headaches and head congestion as well. No vomiting or diarrhea. No abdominal pain or GU symptoms. Taking medications as prescribed.  Past Medical History  Diagnosis Date  . Hyperlipidemia   . Low back pain   . Osteoporosis   . BPH (benign prostatic hypertrophy)   . Colon polyps   . Diabetes mellitus     diet and exercise controlled  . Acute URI 01/03/2013  . Hematuria 04/07/2013  . Dental infection 10/07/2013  . Pain in joint, shoulder region 10/07/2013  . Mass of neck 10/07/2013    Past Surgical History  Procedure Laterality Date  . Hand surgery    . Polypectomy    . Colonoscopy      Family History  Problem Relation Age of Onset  . Colon cancer Neg Hx   . Stomach cancer Neg Hx   . Esophageal cancer Neg Hx     History   Social History  . Marital Status: Married    Spouse Name: Young    Number of Children: N/A  . Years of Education: N/A   Occupational History  .     Social History Main Topics  . Smoking status: Former Smoker    Types: Cigarettes    Quit date: 07/27/1996  . Smokeless tobacco: Never Used  . Alcohol Use: No  . Drug Use: No  . Sexual Activity: Not on file   Other Topics Concern  . Not on file   Social History Narrative   Supportive daughter    Current Outpatient Prescriptions on File Prior to Visit  Medication Sig Dispense Refill  . aspirin 81 MG tablet Take 81 mg by mouth daily.        . Calcium Carbonate (CALCIUM 500 PO) Take by mouth 2  (two) times daily.        . Coenzyme Q10 (CO Q 10) 100 MG CAPS Take 200 mg by mouth daily.      . Ginger, Zingiber officinalis, (GINGER ROOT PO) Take by mouth.        Marland Kitchen KRILL OIL PO Take by mouth.      . methocarbamol (ROBAXIN) 500 MG tablet Take 1 tablet (500 mg total) by mouth at bedtime as needed for muscle spasms.  30 tablet  0  . Multiple Vitamin (MULTIVITAMIN) capsule Take 1 capsule by mouth daily.        . ONE TOUCH ULTRA TEST test strip USE TO TEST BLOOD SUGAR EVERY DAY  100 each  3  . simvastatin (ZOCOR) 20 MG tablet Take 1 tablet (20 mg total) by mouth at bedtime.  90 tablet  0  . vitamin C (ASCORBIC ACID) 500 MG tablet Take 1,000 mg by mouth 2 (two) times daily.        No current facility-administered medications on file prior to visit.    No Known Allergies  Review of Systems  Review of Systems  Constitutional: Positive  for fever and malaise/fatigue.  HENT: Positive for congestion.   Eyes: Negative for discharge.  Respiratory: Positive for cough and sputum production. Negative for shortness of breath.   Cardiovascular: Negative for chest pain, palpitations and leg swelling.  Gastrointestinal: Negative for nausea, abdominal pain and diarrhea.  Genitourinary: Negative for dysuria.  Musculoskeletal: Positive for myalgias. Negative for falls.  Skin: Negative for rash.  Neurological: Positive for headaches. Negative for loss of consciousness.  Endo/Heme/Allergies: Negative for polydipsia.  Psychiatric/Behavioral: Negative for depression and suicidal ideas. The patient is not nervous/anxious and does not have insomnia.     Objective  BP 120/74  Pulse 72  Temp(Src) 98.3 F (36.8 C) (Oral)  Ht 5\' 7"  (1.702 m)  Wt 152 lb 0.6 oz (68.965 kg)  BMI 23.81 kg/m2  SpO2 97%  Physical Exam  Physical Exam  Constitutional: He is oriented to person, place, and time and well-developed, well-nourished, and in no distress. No distress.  HENT:  Head: Normocephalic and atraumatic.   Eyes: Conjunctivae are normal.  Neck: Neck supple. No thyromegaly present.  Cardiovascular: Normal rate, regular rhythm and normal heart sounds.   No murmur heard. Pulmonary/Chest: Effort normal and breath sounds normal. No respiratory distress.  Abdominal: He exhibits no distension and no mass. There is no tenderness.  Musculoskeletal: He exhibits no edema.  Neurological: He is alert and oriented to person, place, and time.  Skin: Skin is warm.  Psychiatric: Memory, affect and judgment normal.    Lab Results  Component Value Date   TSH 2.355 10/05/2013   Lab Results  Component Value Date   WBC 5.8 10/05/2013   HGB 14.2 10/05/2013   HCT 40.2 10/05/2013   MCV 97.3 10/05/2013   PLT 188 10/05/2013   Lab Results  Component Value Date   CREATININE 0.75 10/05/2013   BUN 12 10/05/2013   NA 140 10/05/2013   K 3.9 10/05/2013   CL 106 10/05/2013   CO2 24 10/05/2013   Lab Results  Component Value Date   ALT 39 10/05/2013   AST 28 10/05/2013   ALKPHOS 90 10/05/2013   BILITOT 0.3 10/05/2013   Lab Results  Component Value Date   CHOL 162 10/05/2013   Lab Results  Component Value Date   HDL 35* 10/05/2013   Lab Results  Component Value Date   LDLCALC 58 10/05/2013   Lab Results  Component Value Date   TRIG 343* 10/05/2013   Lab Results  Component Value Date   CHOLHDL 4.6 10/05/2013     Assessment & Plan  HYPERTENSION Well controlled no changes.   Influenza due to identified novel influenza A virus with other respiratory manifestations Started on Tamiflu after testing positive. Encouraged increased exercise and rest.  Cough given small amount of Tussionex to use qhs to help with rest

## 2013-11-19 NOTE — Assessment & Plan Note (Signed)
given small amount of Tussionex to use qhs to help with rest

## 2013-11-19 NOTE — Assessment & Plan Note (Signed)
Started on Tamiflu after testing positive. Encouraged increased exercise and rest.

## 2013-11-24 ENCOUNTER — Telehealth: Payer: Self-pay

## 2013-11-24 NOTE — Telephone Encounter (Signed)
Please contact this pt per Dr Charlett Blake and let him know that his prolia will be $147. If he would like to start this, please schedule an appt

## 2013-11-27 NOTE — Telephone Encounter (Signed)
Left detailed message informing patient that prolia would be $147 and to call our office if he would like to schedule this.

## 2013-11-27 NOTE — Telephone Encounter (Signed)
Patient daughter called back wanting to know what exactly this injection is for?

## 2013-12-05 ENCOUNTER — Telehealth: Payer: Self-pay | Admitting: Family Medicine

## 2013-12-05 NOTE — Telephone Encounter (Signed)
Please inform pts daughter that this is for osteoporosis.  thanks

## 2013-12-05 NOTE — Telephone Encounter (Signed)
Daughter wants to know what test was done to decide her father needs prolia.  If it was blood work she wants the results.  If this is for osteoporsis  Is this a new diabnosis and how was this arrived at

## 2013-12-05 NOTE — Telephone Encounter (Signed)
Please advise? Osteoporosis was in pts chart from another provider?

## 2013-12-05 NOTE — Telephone Encounter (Signed)
It helps to rebuild bone without the GI side effects and it is only twice a year. If they would rather start with Fosamax they can but it is once a week on an empty stomach and then 1/2 hour upright without eating each time. Has potential GI side effects

## 2013-12-05 NOTE — Telephone Encounter (Signed)
Look at message below

## 2013-12-05 NOTE — Telephone Encounter (Signed)
Left detailed message informing patient daughter that prolia is for osteoprosis and to call our office if she has any questions.

## 2013-12-06 NOTE — Telephone Encounter (Signed)
pts daughter informed and states she is going to look into the price for the Fosamax and will call us back if she/her father decides to do this injection

## 2013-12-07 ENCOUNTER — Telehealth: Payer: Self-pay | Admitting: Family Medicine

## 2013-12-07 NOTE — Telephone Encounter (Signed)
Requesting to speak to nurse about fosamax

## 2013-12-11 NOTE — Telephone Encounter (Signed)
Patients daughter called back and would like to know how MD came up with father needing Prolia? I informed pts daughter that Osteoporosis is in the chart from Dr Shawna Orleans in 2008? Pts daughter stated that Dr Shawna Orleans never told them that. Pts daughter would like to know why they weren't told? I tried explaining that we were sorry they weren't told of this diagnosis but we can't answer to why. Pts daughter states that mom has osteoporosis and is wandering if it was entered in the wrong chart on accident? However I looked in pts mothers chart and it is noted in it also.   Pts daughter would like to know what testing was done or should be done to prove this diagnosis?  Pts daughter was informed that MD would be out of the office until 12-14-13 but we would contact her as soon as we have an answer.

## 2013-12-11 NOTE — Telephone Encounter (Signed)
Marvin Lukes, MD at 12/05/2013 11:05 PM      Status: Signed        It helps to rebuild bone without the GI side effects and it is only twice a year. If they would rather start with Fosamax they can but it is once a week on an empty stomach and then 1/2 hour upright without eating each time. Has potential GI side effects   Left a message on daughters vm explaining again what Dr Charlett Blake said. Statement above

## 2013-12-12 NOTE — Telephone Encounter (Signed)
So I cannot find any imaging to document this diagnosis either so I will not proceed with Prolia. We can discuss doing imaging in the future there is no rush since he is asymptomatic. Sorry for the confusion. I know the mother has osteoporosis as well. She would be a candidate for Prolia if they are interested.

## 2013-12-13 NOTE — Telephone Encounter (Signed)
Patients daughter informed and voiced understanding.  Pts daughter states they will discuss this and either call back and let us know or they will let us know at next appt

## 2014-02-05 ENCOUNTER — Ambulatory Visit: Payer: Medicare Other | Admitting: Family Medicine

## 2014-08-01 ENCOUNTER — Encounter: Payer: Self-pay | Admitting: Internal Medicine

## 2014-08-17 ENCOUNTER — Other Ambulatory Visit: Payer: Self-pay

## 2014-11-10 ENCOUNTER — Other Ambulatory Visit: Payer: Self-pay | Admitting: Family Medicine

## 2014-11-12 NOTE — Telephone Encounter (Signed)
Last filled:  09/22/13 Amt: 90, 0   Last OV:  11/13/13-for acute issue  Med filled x 1 month.  Needs OV.

## 2014-12-18 ENCOUNTER — Other Ambulatory Visit: Payer: Self-pay | Admitting: Family Medicine

## 2014-12-18 MED ORDER — SIMVASTATIN 20 MG PO TABS: 20.0000 mg | ORAL_TABLET | Freq: Every day | ORAL | Status: DC

## 2015-01-03 ENCOUNTER — Encounter: Payer: Medicare Other | Admitting: Family Medicine

## 2015-04-08 ENCOUNTER — Other Ambulatory Visit: Payer: Self-pay | Admitting: Family Medicine

## 2015-04-29 ENCOUNTER — Other Ambulatory Visit: Payer: Self-pay

## 2015-05-07 ENCOUNTER — Telehealth: Payer: Self-pay | Admitting: Family Medicine

## 2015-05-07 NOTE — Telephone Encounter (Signed)
pre visit letter mailed 04/30/15

## 2015-05-20 ENCOUNTER — Telehealth: Payer: Self-pay | Admitting: *Deleted

## 2015-05-20 NOTE — Telephone Encounter (Signed)
Attempted to reach [place] interpreter using [employer], unable to connect.   Unable to reach patient at time of Pre-Visit Call.  Left message to confirm appointment.

## 2015-05-21 ENCOUNTER — Ambulatory Visit (HOSPITAL_BASED_OUTPATIENT_CLINIC_OR_DEPARTMENT_OTHER)
Admission: RE | Admit: 2015-05-21 | Discharge: 2015-05-21 | Disposition: A | Discharge status: Home / Self Care | Payer: Medicare Other | Source: Ambulatory Visit | Attending: Family Medicine | Admitting: Family Medicine

## 2015-05-21 ENCOUNTER — Ambulatory Visit (INDEPENDENT_AMBULATORY_CARE_PROVIDER_SITE_OTHER): Payer: Medicare Other | Admitting: Family Medicine

## 2015-05-21 ENCOUNTER — Encounter: Payer: Self-pay | Admitting: Family Medicine

## 2015-05-21 VITALS — BP 138/88 | HR 82 | Temp 98.1°F | Ht 67.0 in | Wt 155.4 lb

## 2015-05-21 DIAGNOSIS — R05 Cough: Secondary | ICD-10-CM

## 2015-05-21 DIAGNOSIS — Z0000 Encounter for general adult medical examination without abnormal findings: Secondary | ICD-10-CM | POA: Diagnosis not present

## 2015-05-21 DIAGNOSIS — H547 Unspecified visual loss: Secondary | ICD-10-CM | POA: Diagnosis not present

## 2015-05-21 DIAGNOSIS — E119 Type 2 diabetes mellitus without complications: Secondary | ICD-10-CM | POA: Insufficient documentation

## 2015-05-21 DIAGNOSIS — I10 Essential (primary) hypertension: Secondary | ICD-10-CM | POA: Diagnosis not present

## 2015-05-21 DIAGNOSIS — R059 Cough, unspecified: Secondary | ICD-10-CM

## 2015-05-21 DIAGNOSIS — E782 Mixed hyperlipidemia: Secondary | ICD-10-CM

## 2015-05-21 DIAGNOSIS — M810 Age-related osteoporosis without current pathological fracture: Secondary | ICD-10-CM | POA: Diagnosis not present

## 2015-05-21 DIAGNOSIS — R319 Hematuria, unspecified: Secondary | ICD-10-CM

## 2015-05-21 DIAGNOSIS — R011 Cardiac murmur, unspecified: Secondary | ICD-10-CM

## 2015-05-21 DIAGNOSIS — Z86010 Personal history of colonic polyps: Secondary | ICD-10-CM

## 2015-05-21 NOTE — Progress Notes (Signed)
Marvin Mcdonald  [id number] [birth year] 05/21/2015      Progress Note-Follow Up  Subjective  Chief Complaint  Chief Complaint  Patient presents with  . Annual Exam    HPI  Patient is a 74 y.o. male in today for routine medical care. Patient is in today for annual exam accompanied by his daughter. His greatest complaint is of a cough and malaise. He reports he fatigues easily although he still feels he has good exercise tolerance. Also notes some urinary frequency and anorexia. No n/v but does report stool is dark but not sticky. Denies CP/palp/SOB/HA/congestion/fevers/GI or GU c/o. Taking meds as prescribed  Past Medical History  Diagnosis Date  . Hyperlipidemia   . Low back pain   . Osteoporosis   . BPH (benign prostatic hypertrophy)   . Colon polyps   . Diabetes mellitus     diet and exercise controlled  . Acute URI 01/03/2013  . Hematuria 04/07/2013  . Dental infection 10/07/2013  . Pain in joint, shoulder region 10/07/2013  . Mass of neck 10/07/2013    Past Surgical History  Procedure Laterality Date  . Hand surgery    . Polypectomy    . Colonoscopy      Family History  Problem Relation Age of Onset  . Colon cancer Neg Hx   . Stomach cancer Neg Hx   . Esophageal cancer Neg Hx     History   Social History  . Marital Status: Married    Spouse Name: Marvin Mcdonald  . Number of Children: N/A  . Years of Education: N/A   Occupational History  .     Social History Main Topics  . Smoking status: Former Smoker    Types: Cigarettes    Quit date: 07/27/1996  . Smokeless tobacco: Never Used  . Alcohol Use: No  . Drug Use: No  . Sexual Activity: Not on file   Other Topics Concern  . Not on file   Social History Narrative   Supportive daughter    Current Outpatient Prescriptions on File Prior to Visit  Medication Sig Dispense Refill  . aspirin 81 MG tablet Take 81 mg by mouth daily.      . Calcium Carbonate (CALCIUM 500 PO) Take by mouth 2 (two) times daily.        . Coenzyme Q10 (CO Q 10) 100 MG CAPS Take 200 mg by mouth daily.    . Ginger, Zingiber officinalis, (GINGER ROOT PO) Take by mouth.      Marland Kitchen KRILL OIL PO Take by mouth.    . Multiple Vitamin (MULTIVITAMIN) capsule Take 1 capsule by mouth daily.      . ONE TOUCH ULTRA TEST test strip USE TO TEST BLOOD SUGAR EVERY DAY 100 each 3  . simvastatin (ZOCOR) 20 MG tablet TAKE 1 TABLET BY MOUTH EVERY NIGHT AT BEDTIME 90 tablet 0  . vitamin C (ASCORBIC ACID) 500 MG tablet Take 1,000 mg by mouth 2 (two) times daily.     . methocarbamol (ROBAXIN) 500 MG tablet Take 1 tablet (500 mg total) by mouth at bedtime as needed for muscle spasms. (Patient not taking: Reported on 05/21/2015) 30 tablet 0   No current facility-administered medications on file prior to visit.    No Known Allergies  Review of Systems  Review of Systems  Constitutional: Positive for malaise/fatigue. Negative for fever and chills.  HENT: Positive for congestion. Negative for hearing loss and nosebleeds.   Eyes: Negative for discharge.  Respiratory: Positive  for cough and shortness of breath. Negative for sputum production and wheezing.   Cardiovascular: Negative for chest pain, palpitations and leg swelling.  Gastrointestinal: Negative for heartburn, nausea, vomiting, abdominal pain, diarrhea, constipation and blood in stool.  Genitourinary: Positive for frequency. Negative for dysuria, urgency and hematuria.  Musculoskeletal: Negative for myalgias, back pain and falls.  Skin: Negative for rash.  Neurological: Negative for dizziness, tremors, sensory change, focal weakness, loss of consciousness, weakness and headaches.  Endo/Heme/Allergies: Negative for polydipsia. Does not bruise/bleed easily.  Psychiatric/Behavioral: Negative for depression and suicidal ideas. The patient is not nervous/anxious and does not have insomnia.     Objective  BP 138/88 mmHg  Pulse 82  Temp(Src) 98.1 F (36.7 C) (Oral)  Ht 5\' 7"  (1.702 m)  Wt 155  lb 6 oz (70.478 kg)  BMI 24.33 kg/m2  SpO2 95%  Physical Exam  Physical Exam  Constitutional: He is oriented to person, place, and time and well-developed, well-nourished, and in no distress. No distress.  HENT:  Head: Normocephalic and atraumatic.  Eyes: Conjunctivae are normal.  Neck: Neck supple. No thyromegaly present.  Cardiovascular: Normal rate and regular rhythm.   Murmur heard. Pulmonary/Chest: Effort normal and breath sounds normal. No respiratory distress.  Abdominal: He exhibits no distension and no mass. There is no tenderness.  Musculoskeletal: He exhibits no edema.  Neurological: He is alert and oriented to person, place, and time.  Skin: Skin is warm.  Psychiatric: Memory, affect and judgment normal.    Lab Results  Component Value Date   TSH 2.355 10/05/2013   Lab Results  Component Value Date   WBC 5.8 10/05/2013   HGB 14.2 10/05/2013   HCT 40.2 10/05/2013   MCV 97.3 10/05/2013   PLT 188 10/05/2013   Lab Results  Component Value Date   CREATININE 0.75 10/05/2013   BUN 12 10/05/2013   NA 140 10/05/2013   K 3.9 10/05/2013   CL 106 10/05/2013   CO2 24 10/05/2013   Lab Results  Component Value Date   ALT 39 10/05/2013   AST 28 10/05/2013   ALKPHOS 90 10/05/2013   BILITOT 0.3 10/05/2013   Lab Results  Component Value Date   CHOL 162 10/05/2013   Lab Results  Component Value Date   HDL 35* 10/05/2013   Lab Results  Component Value Date   LDLCALC 58 10/05/2013   Lab Results  Component Value Date   TRIG 343* 10/05/2013   Lab Results  Component Value Date   CHOLHDL 4.6 10/05/2013     Assessment & Plan  Essential hypertension Well controlled, no changes to meds. Encouraged heart healthy diet such as the DASH diet and exercise as tolerated.   Cough With malaise. 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 Will refer to health department for input and to pulmonology for  surveillance.   Diabetes type 2, controlled hgba1c acceptable, minimize simple carbs. Increase exercise as tolerated. Continue current meds  Hyperlipidemia, mixed Tolerating statin, encouraged heart healthy diet, avoid trans fats, minimize simple carbs and saturated fats. Increase exercise as tolerated  History of colonic polyps Colonoscopy 2012 to repeat in 2017  Decreased visual acuity Referred to opthamology for further intervention.   Newly recognized murmur Echo unremarkable, only trivial Mitral Valve Regurgitation.   Medicare annual wellness visit, subsequent Patient denies any difficulties at home. No trouble with ADLs, depression or falls. No recent changes to vision or hearing. Is UTD with immunizations.  Is UTD with screening. Discussed Advanced Directives, patient agrees to bring [place] copies of documents if can. Encouraged heart healthy diet, exercise as tolerated and adequate sleep. Labs ordered and reviewed Sees Dr Henrene Pastor of [facility], last colonoscopy 08/2011 repeat in 5 years See problem list for risk factors, see AVS for preventative healthcare recommendations.

## 2015-05-21 NOTE — Assessment & Plan Note (Signed)
Well controlled, no changes to meds. Encouraged heart healthy diet such as the DASH diet and exercise as tolerated.  °

## 2015-05-21 NOTE — Patient Instructions (Signed)
Preventive Care for Adults A healthy lifestyle and preventive care can promote health and wellness. Preventive health guidelines for men include the following key practices:  A routine yearly physical is a good way to check with your health care provider about your health and preventative screening. It is a chance to share any concerns and updates on your health and to receive a thorough exam.  Visit your dentist for a routine exam and preventative care every 6 months. Brush your teeth twice a day and floss once a day. Good oral hygiene prevents tooth decay and gum disease.  The frequency of eye exams is based on your age, health, family medical history, use of contact lenses, and other factors. Follow your health care provider's recommendations for frequency of eye exams.  Eat a healthy diet. Foods such as vegetables, fruits, whole grains, low-fat dairy products, and lean protein foods contain the nutrients you need without too many calories. Decrease your intake of foods high in solid fats, added sugars, and salt. Eat the right amount of calories for you.Get information about a proper diet from your health care provider, if necessary.  Regular physical exercise is one of the most important things you can do for your health. Most adults should get at least 150 minutes of moderate-intensity exercise (any activity that increases your heart rate and causes you to sweat) each week. In addition, most adults need muscle-strengthening exercises on 2 or more days a week.  Maintain a healthy weight. The body mass index (BMI) is a screening tool to identify possible weight problems. It provides an estimate of body fat based on height and weight. Your health care provider can find your BMI and can help you achieve or maintain a healthy weight.For adults 20 years and older:  A BMI below 18.5 is considered underweight.  A BMI of 18.5 to 24.9 is normal.  A BMI of 25 to 29.9 is considered overweight.  A BMI  of 30 and above is considered obese.  Maintain normal blood lipids and cholesterol levels by exercising and minimizing your intake of saturated fat. Eat a balanced diet with plenty of fruit and vegetables. Blood tests for lipids and cholesterol should begin at age 50 and be repeated every 5 years. If your lipid or cholesterol levels are high, you are over 50, or you are at high risk for heart disease, you may need your cholesterol levels checked more frequently.Ongoing high lipid and cholesterol levels should be treated with medicines if diet and exercise are not working.  If you smoke, find out from your health care provider how to quit. If you do not use tobacco, do not start.  Lung cancer screening is recommended for adults aged 73-80 years who are at high risk for developing lung cancer because of a history of smoking. A yearly low-dose CT scan of the lungs is recommended for people who have at least a 30-pack-year history of smoking and are a current smoker or have quit within the past 15 years. A pack year of smoking is smoking an average of 1 pack of cigarettes a day for 1 year (for example: 1 pack a day for 30 years or 2 packs a day for 15 years). Yearly screening should continue until the smoker has stopped smoking for at least 15 years. Yearly screening should be stopped for people who develop a health problem that would prevent them from having lung cancer treatment.  If you choose to drink alcohol, do not have more than  2 drinks per day. One drink is considered to be 12 ounces (355 mL) of beer, 5 ounces (148 mL) of wine, or 1.5 ounces (44 mL) of liquor.  Avoid use of street drugs. Do not share needles with anyone. Ask for help if you need support or instructions about stopping the use of drugs.  High blood pressure causes heart disease and increases the risk of stroke. Your blood pressure should be checked at least every 1-2 years. Ongoing high blood pressure should be treated with  medicines, if weight loss and exercise are not effective.  If you are 45-79 years old, ask your health care provider if you should take aspirin to prevent heart disease.  Diabetes screening involves taking a blood sample to check your fasting blood sugar level. This should be done once every 3 years, after age 45, if you are within normal weight and without risk factors for diabetes. Testing should be considered at a younger age or be carried out more frequently if you are overweight and have at least 1 risk factor for diabetes.  Colorectal cancer can be detected and often prevented. Most routine colorectal cancer screening begins at the age of 50 and continues through age 75. However, your health care provider may recommend screening at an earlier age if you have risk factors for colon cancer. On a yearly basis, your health care provider may provide home test kits to check for hidden blood in the stool. Use of a small camera at the end of a tube to directly examine the colon (sigmoidoscopy or colonoscopy) can detect the earliest forms of colorectal cancer. Talk to your health care provider about this at age 50, when routine screening begins. Direct exam of the colon should be repeated every 5-10 years through age 75, unless early forms of precancerous polyps or small growths are found.  People who are at an increased risk for hepatitis B should be screened for this virus. You are considered at high risk for hepatitis B if:  You were born in a country where hepatitis B occurs often. Talk with your health care provider about which countries are considered high risk.  Your parents were born in a high-risk country and you have not received a shot to protect against hepatitis B (hepatitis B vaccine).  You have HIV or AIDS.  You use needles to inject street drugs.  You live with, or have sex with, someone who has hepatitis B.  You are a man who has sex with other men (MSM).  You get hemodialysis  treatment.  You take certain medicines for conditions such as cancer, organ transplantation, and autoimmune conditions.  Hepatitis C blood testing is recommended for all people born from 1945 through 1965 and any individual with known risks for hepatitis C.  Practice safe sex. Use condoms and avoid high-risk sexual practices to reduce the spread of sexually transmitted infections (STIs). STIs include gonorrhea, chlamydia, syphilis, trichomonas, herpes, HPV, and human immunodeficiency virus (HIV). Herpes, HIV, and HPV are viral illnesses that have no cure. They can result in disability, cancer, and death.  If you are at risk of being infected with HIV, it is recommended that you take a prescription medicine daily to prevent HIV infection. This is called preexposure prophylaxis (PrEP). You are considered at risk if:  You are a man who has sex with other men (MSM) and have other risk factors.  You are a heterosexual man, are sexually active, and are at increased risk for HIV infection.    You take drugs by injection.  You are sexually active with a partner who has HIV.  Talk with your health care provider about whether you are at high risk of being infected with HIV. If you choose to begin PrEP, you should first be tested for HIV. You should then be tested every 3 months for as long as you are taking PrEP.  A one-time screening for abdominal aortic aneurysm (AAA) and surgical repair of large AAAs by ultrasound are recommended for men ages 32 to 67 years who are current or former smokers.  Healthy men should no longer receive prostate-specific antigen (PSA) blood tests as part of routine cancer screening. Talk with your health care provider about prostate cancer screening.  Testicular cancer screening is not recommended for adult males who have no symptoms. Screening includes self-exam, a health care provider exam, and other screening tests. Consult with your health care provider about any symptoms  you have or any concerns you have about testicular cancer.  Use sunscreen. Apply sunscreen liberally and repeatedly throughout the day. You should seek shade when your shadow is shorter than you. Protect yourself by wearing long sleeves, pants, a wide-brimmed hat, and sunglasses year round, whenever you are outdoors.  Once a month, do a whole-body skin exam, using a mirror to look at the skin on your back. Tell your health care provider about new moles, moles that have irregular borders, moles that are larger than a pencil eraser, or moles that have changed in shape or color.  Stay current with required vaccines (immunizations).  Influenza vaccine. All adults should be immunized every year.  Tetanus, diphtheria, and acellular pertussis (Td, Tdap) vaccine. An adult who has not previously received Tdap or who does not know his vaccine status should receive 1 dose of Tdap. This initial dose should be followed by tetanus and diphtheria toxoids (Td) booster doses every 10 years. Adults with an unknown or incomplete history of completing a 3-dose immunization series with Td-containing vaccines should begin or complete a primary immunization series including a Tdap dose. Adults should receive a Td booster every 10 years.  Varicella vaccine. An adult without evidence of immunity to varicella should receive 2 doses or a second dose if he has previously received 1 dose.  Human papillomavirus (HPV) vaccine. Males aged 68-21 years who have not received the vaccine previously should receive the 3-dose series. Males aged 22-26 years may be immunized. Immunization is recommended through the age of 6 years for any male who has sex with males and did not get any or all doses earlier. Immunization is recommended for any person with an immunocompromised condition through the age of 49 years if he did not get any or all doses earlier. During the 3-dose series, the second dose should be obtained 4-8 weeks after the first  dose. The third dose should be obtained 24 weeks after the first dose and 16 weeks after the second dose.  Zoster vaccine. One dose is recommended for adults aged 50 years or older unless certain conditions are present.  Measles, mumps, and rubella (MMR) vaccine. Adults born before [birth year] generally are considered immune to measles and mumps. Adults born in [birth year] or later should have 1 or more doses of MMR vaccine unless there is a contraindication to the vaccine or there is laboratory evidence of immunity to each of the three diseases. A routine second dose of MMR vaccine should be obtained at least 28 days after the first dose for students attending postsecondary  schools, health care workers, or international travelers. People who received inactivated measles vaccine or an unknown type of measles vaccine during 1963-1967 should receive 2 doses of MMR vaccine. People who received inactivated mumps vaccine or an unknown type of mumps vaccine before 1979 and are at high risk for mumps infection should consider immunization with 2 doses of MMR vaccine. Unvaccinated health care workers born before 1957 who lack laboratory evidence of measles, mumps, or rubella immunity or laboratory confirmation of disease should consider measles and mumps immunization with 2 doses of MMR vaccine or rubella immunization with 1 dose of MMR vaccine.  Pneumococcal 13-valent conjugate (PCV13) vaccine. When indicated, a person who is uncertain of his immunization history and has no record of immunization should receive the PCV13 vaccine. An adult aged 19 years or older who has certain medical conditions and has not been previously immunized should receive 1 dose of PCV13 vaccine. This PCV13 should be followed with a dose of pneumococcal polysaccharide (PPSV23) vaccine. The PPSV23 vaccine dose should be obtained at least 8 weeks after the dose of PCV13 vaccine. An adult aged 19 years or older who has certain medical conditions and  previously received 1 or more doses of PPSV23 vaccine should receive 1 dose of PCV13. The PCV13 vaccine dose should be obtained 1 or more years after the last PPSV23 vaccine dose.  Pneumococcal polysaccharide (PPSV23) vaccine. When PCV13 is also indicated, PCV13 should be obtained first. All adults aged 65 years and older should be immunized. An adult younger than age 65 years who has certain medical conditions should be immunized. Any person who resides in a nursing home or long-term care facility should be immunized. An adult smoker should be immunized. People with an immunocompromised condition and certain other conditions should receive both PCV13 and PPSV23 vaccines. People with human immunodeficiency virus (HIV) infection should be immunized as soon as possible after diagnosis. Immunization during chemotherapy or radiation therapy should be avoided. Routine use of PPSV23 vaccine is not recommended for American Indians, [place] Natives, or people younger than 65 years unless there are medical conditions that require PPSV23 vaccine. When indicated, people who have unknown immunization and have no record of immunization should receive PPSV23 vaccine. One-time revaccination 5 years after the first dose of PPSV23 is recommended for people aged 19-64 years who have chronic kidney failure, nephrotic syndrome, asplenia, or immunocompromised conditions. People who received 1-2 doses of PPSV23 before age 65 years should receive another dose of PPSV23 vaccine at age 65 years or later if at least 5 years have passed since the previous dose. Doses of PPSV23 are not needed for people immunized with PPSV23 at or after age 65 years.  Meningococcal vaccine. Adults with asplenia or persistent complement component deficiencies should receive 2 doses of quadrivalent meningococcal conjugate (MenACWY-D) vaccine. The doses should be obtained at least 2 months apart. Microbiologists working with certain meningococcal bacteria,  military recruits, people at risk during an outbreak, and people who travel to or live in countries with a high rate of meningitis should be immunized. A first-year college student up through age 21 years who is living in a residence hall should receive a dose if he did not receive a dose on or after his 16th birthday. Adults who have certain high-risk conditions should receive one or more doses of vaccine.  Hepatitis A vaccine. Adults who wish to be protected from this disease, have certain high-risk conditions, work with hepatitis A-infected animals, work in hepatitis A research labs, or   travel to or work in countries with a high rate of hepatitis A should be immunized. Adults who were previously unvaccinated and who anticipate close contact with an international adoptee during the first 60 days after arrival in the [place] from a country with a high rate of hepatitis A should be immunized.  Hepatitis B vaccine. Adults should be immunized if they wish to be protected from this disease, have certain high-risk conditions, may be exposed to blood or other infectious body fluids, are household contacts or sex partners of hepatitis B positive people, are clients or workers in certain care facilities, or travel to or work in countries with a high rate of hepatitis B.  Haemophilus influenzae type b (Hib) vaccine. A previously unvaccinated person with asplenia or sickle cell disease or having a scheduled splenectomy should receive 1 dose of Hib vaccine. Regardless of previous immunization, a recipient of a hematopoietic stem cell transplant should receive a 3-dose series 6-12 months after his successful transplant. Hib vaccine is not recommended for adults with HIV infection. Preventive Service / Frequency Ages 52 to 17  Blood pressure check.** / Every 1 to 2 years.  Lipid and cholesterol check.** / Every 5 years beginning at age 69.  Hepatitis C blood test.** / For any individual with known risks for  hepatitis C.  Skin self-exam. / Monthly.  Influenza vaccine. / Every year.  Tetanus, diphtheria, and acellular pertussis (Tdap, Td) vaccine.** / Consult your health care provider. 1 dose of Td every 10 years.  Varicella vaccine.** / Consult your health care provider.  HPV vaccine. / 3 doses over 6 months, if 72 or younger.  Measles, mumps, rubella (MMR) vaccine.** / You need at least 1 dose of MMR if you were born in 1957 or later. You may also need a second dose.  Pneumococcal 13-valent conjugate (PCV13) vaccine.** / Consult your health care provider.  Pneumococcal polysaccharide (PPSV23) vaccine.** / 1 to 2 doses if you smoke cigarettes or if you have certain conditions.  Meningococcal vaccine.** / 1 dose if you are age 35 to 60 years and a Market researcher living in a residence hall, or have one of several medical conditions. You may also need additional booster doses.  Hepatitis A vaccine.** / Consult your health care provider.  Hepatitis B vaccine.** / Consult your health care provider.  Haemophilus influenzae type b (Hib) vaccine.** / Consult your health care provider. Ages 35 to 8  Blood pressure check.** / Every 1 to 2 years.  Lipid and cholesterol check.** / Every 5 years beginning at age 57.  Lung cancer screening. / Every year if you are aged 44-80 years and have a 30-pack-year history of smoking and currently smoke or have quit within the past 15 years. Yearly screening is stopped once you have quit smoking for at least 15 years or develop a health problem that would prevent you from having lung cancer treatment.  Fecal occult blood test (FOBT) of stool. / Every year beginning at age 55 and continuing until age 73. You may not have to do this test if you get a colonoscopy every 10 years.  Flexible sigmoidoscopy** or colonoscopy.** / Every 5 years for a flexible sigmoidoscopy or every 10 years for a colonoscopy beginning at age 28 and continuing until age  1.  Hepatitis C blood test.** / For all people born from [birth year] through 1965 and any individual with known risks for hepatitis C.  Skin self-exam. / Monthly.  Influenza vaccine. / Every  year.  Tetanus, diphtheria, and acellular pertussis (Tdap/Td) vaccine.** / Consult your health care provider. 1 dose of Td every 10 years.  Varicella vaccine.** / Consult your health care provider.  Zoster vaccine.** / 1 dose for adults aged 53 years or older.  Measles, mumps, rubella (MMR) vaccine.** / You need at least 1 dose of MMR if you were born in 1957 or later. You may also need a second dose.  Pneumococcal 13-valent conjugate (PCV13) vaccine.** / Consult your health care provider.  Pneumococcal polysaccharide (PPSV23) vaccine.** / 1 to 2 doses if you smoke cigarettes or if you have certain conditions.  Meningococcal vaccine.** / Consult your health care provider.  Hepatitis A vaccine.** / Consult your health care provider.  Hepatitis B vaccine.** / Consult your health care provider.  Haemophilus influenzae type b (Hib) vaccine.** / Consult your health care provider. Ages 77 and over  Blood pressure check.** / Every 1 to 2 years.  Lipid and cholesterol check.**/ Every 5 years beginning at age 85.  Lung cancer screening. / Every year if you are aged 55-80 years and have a 30-pack-year history of smoking and currently smoke or have quit within the past 15 years. Yearly screening is stopped once you have quit smoking for at least 15 years or develop a health problem that would prevent you from having lung cancer treatment.  Fecal occult blood test (FOBT) of stool. / Every year beginning at age 33 and continuing until age 11. You may not have to do this test if you get a colonoscopy every 10 years.  Flexible sigmoidoscopy** or colonoscopy.** / Every 5 years for a flexible sigmoidoscopy or every 10 years for a colonoscopy beginning at age 28 and continuing until age 73.  Hepatitis C blood  test.** / For all people born from [birth year] through 1965 and any individual with known risks for hepatitis C.  Abdominal aortic aneurysm (AAA) screening.** / A one-time screening for ages 50 to 27 years who are current or former smokers.  Skin self-exam. / Monthly.  Influenza vaccine. / Every year.  Tetanus, diphtheria, and acellular pertussis (Tdap/Td) vaccine.** / 1 dose of Td every 10 years.  Varicella vaccine.** / Consult your health care provider.  Zoster vaccine.** / 1 dose for adults aged 34 years or older.  Pneumococcal 13-valent conjugate (PCV13) vaccine.** / Consult your health care provider.  Pneumococcal polysaccharide (PPSV23) vaccine.** / 1 dose for all adults aged 63 years and older.  Meningococcal vaccine.** / Consult your health care provider.  Hepatitis A vaccine.** / Consult your health care provider.  Hepatitis B vaccine.** / Consult your health care provider.  Haemophilus influenzae type b (Hib) vaccine.** / Consult your health care provider. **Family history and personal history of risk and conditions may change your health care provider's recommendations. Document Released: 12/15/2001 Document Revised: 10/24/2013 Document Reviewed: 03/16/2011 [facility] Patient Information 2015 [place], [place]. This information is not intended to replace advice given to you by your health care provider. Make sure you discuss any questions you have with your health care provider.

## 2015-05-21 NOTE — Progress Notes (Signed)
Pre visit review using our clinic review tool, if applicable. No additional management support is needed unless otherwise documented below in the visit note. 

## 2015-05-22 LAB — MICROALBUMIN / CREATININE URINE RATIO
CREATININE, U: 161.6 mg/dL
MICROALB UR: 1.1 mg/dL (ref 0.0–1.9)
Microalb Creat Ratio: 0.7 mg/g (ref 0.0–30.0)

## 2015-05-22 LAB — CBC
HCT: 41.2 % (ref 39.0–52.0)
Hemoglobin: 14.1 g/dL (ref 13.0–17.0)
MCHC: 34.2 g/dL (ref 30.0–36.0)
MCV: 99.4 fl (ref 78.0–100.0)
PLATELETS: 181 10*3/uL (ref 150.0–400.0)
RBC: 4.14 Mil/uL — AB (ref 4.22–5.81)
RDW: 12.6 % (ref 11.5–15.5)
WBC: 5.9 10*3/uL (ref 4.0–10.5)

## 2015-05-22 LAB — LDL CHOLESTEROL, DIRECT: LDL DIRECT: 101 mg/dL

## 2015-05-22 LAB — VITAMIN D 25 HYDROXY (VIT D DEFICIENCY, FRACTURES): VITD: 59.37 ng/mL (ref 30.00–100.00)

## 2015-05-22 LAB — COMPREHENSIVE METABOLIC PANEL
ALT: 37 U/L (ref 0–53)
AST: 26 U/L (ref 0–37)
Albumin: 4.4 g/dL (ref 3.5–5.2)
Alkaline Phosphatase: 77 U/L (ref 39–117)
BILIRUBIN TOTAL: 0.5 mg/dL (ref 0.2–1.2)
BUN: 17 mg/dL (ref 6–23)
CALCIUM: 9.3 mg/dL (ref 8.4–10.5)
CHLORIDE: 106 meq/L (ref 96–112)
CO2: 25 meq/L (ref 19–32)
CREATININE: 1.01 mg/dL (ref 0.40–1.50)
GFR: 76.7 mL/min (ref >60.00)
GLUCOSE: 147 mg/dL — AB (ref 70–99)
Potassium: 4 mEq/L (ref 3.5–5.1)
Sodium: 138 mEq/L (ref 135–145)
Total Protein: 7.5 g/dL (ref 6.0–8.3)

## 2015-05-22 LAB — LIPID PANEL
CHOLESTEROL: 168 mg/dL (ref 0–200)
HDL: 37.7 mg/dL — ABNORMAL LOW (ref >39.00)
NONHDL: 130.3
TRIGLYCERIDES: 206 mg/dL — AB (ref 0.0–149.0)
Total CHOL/HDL Ratio: 4
VLDL: 41.2 mg/dL — AB (ref 0.0–40.0)

## 2015-05-22 LAB — URINALYSIS
Bilirubin Urine: NEGATIVE
Hgb urine dipstick: NEGATIVE
KETONES UR: NEGATIVE
LEUKOCYTES UA: NEGATIVE
NITRITE: NEGATIVE
PH: 6.5 (ref 5.0–8.0)
Specific Gravity, Urine: 1.02 (ref 1.000–1.030)
TOTAL PROTEIN, URINE-UPE24: NEGATIVE
UROBILINOGEN UA: 0.2 (ref 0.0–1.0)
Urine Glucose: NEGATIVE

## 2015-05-22 LAB — TSH: TSH: 1.91 u[IU]/mL (ref 0.35–4.50)

## 2015-05-22 LAB — HEMOGLOBIN A1C: Hgb A1c MFr Bld: 6.3 % (ref 4.6–6.5)

## 2015-05-23 ENCOUNTER — Other Ambulatory Visit: Payer: Self-pay | Admitting: Family Medicine

## 2015-05-23 DIAGNOSIS — R5381 Other malaise: Secondary | ICD-10-CM

## 2015-05-23 DIAGNOSIS — R05 Cough: Secondary | ICD-10-CM

## 2015-05-23 DIAGNOSIS — R059 Cough, unspecified: Secondary | ICD-10-CM

## 2015-05-23 DIAGNOSIS — J8410 Pulmonary fibrosis, unspecified: Secondary | ICD-10-CM

## 2015-05-23 LAB — URINE CULTURE
COLONY COUNT: NO GROWTH
Organism ID, Bacteria: NO GROWTH

## 2015-05-24 ENCOUNTER — Telehealth: Payer: Self-pay | Admitting: Family Medicine

## 2015-05-24 NOTE — Telephone Encounter (Signed)
Called and spoke with the pt's daughter(Eun) and informed her of the pt's recent culture results.  She verbalized understanding.  Also informed her that Dr. Charlett Blake have ordered CT and labs, and we need to schedule the pt for a lab appt.  Lab appt scheduled for Wed (05/29/15 @ 10:30am).//AB/CMA

## 2015-05-24 NOTE — Telephone Encounter (Signed)
Relation to pt: Calma,Eun Call back number: [phone] Pharmacy:  Reason for call:  Patient returning your call

## 2015-05-29 ENCOUNTER — Other Ambulatory Visit (INDEPENDENT_AMBULATORY_CARE_PROVIDER_SITE_OTHER): Payer: Medicare Other

## 2015-05-29 ENCOUNTER — Encounter (HOSPITAL_BASED_OUTPATIENT_CLINIC_OR_DEPARTMENT_OTHER): Payer: Self-pay

## 2015-05-29 ENCOUNTER — Ambulatory Visit (HOSPITAL_BASED_OUTPATIENT_CLINIC_OR_DEPARTMENT_OTHER)
Admission: RE | Admit: 2015-05-29 | Discharge: 2015-05-29 | Disposition: A | Discharge status: Home / Self Care | Payer: Medicare Other | Source: Ambulatory Visit | Attending: Family Medicine | Admitting: Family Medicine

## 2015-05-29 DIAGNOSIS — J8410 Pulmonary fibrosis, unspecified: Secondary | ICD-10-CM

## 2015-05-29 DIAGNOSIS — J849 Interstitial pulmonary disease, unspecified: Secondary | ICD-10-CM | POA: Diagnosis not present

## 2015-05-29 DIAGNOSIS — R059 Cough, unspecified: Secondary | ICD-10-CM

## 2015-05-29 DIAGNOSIS — R5381 Other malaise: Secondary | ICD-10-CM | POA: Diagnosis not present

## 2015-05-29 DIAGNOSIS — J929 Pleural plaque without asbestos: Secondary | ICD-10-CM | POA: Insufficient documentation

## 2015-05-29 DIAGNOSIS — I253 Aneurysm of heart: Secondary | ICD-10-CM | POA: Diagnosis not present

## 2015-05-29 DIAGNOSIS — I340 Nonrheumatic mitral (valve) insufficiency: Secondary | ICD-10-CM | POA: Insufficient documentation

## 2015-05-29 DIAGNOSIS — I700 Atherosclerosis of aorta: Secondary | ICD-10-CM | POA: Diagnosis not present

## 2015-05-29 DIAGNOSIS — R011 Cardiac murmur, unspecified: Secondary | ICD-10-CM

## 2015-05-29 DIAGNOSIS — R05 Cough: Secondary | ICD-10-CM

## 2015-05-29 DIAGNOSIS — I2510 Atherosclerotic heart disease of native coronary artery without angina pectoris: Secondary | ICD-10-CM | POA: Insufficient documentation

## 2015-05-29 MED ORDER — IOHEXOL 300 MG/ML  SOLN
75.0000 mL | Freq: Once | INTRAMUSCULAR | Status: AC | PRN
  Administered 2015-05-29: 75 mL via INTRAVENOUS

## 2015-05-29 NOTE — Progress Notes (Signed)
  Echocardiogram 2D Echocardiogram has been performed.  Darlina Sicilian M 05/29/2015, 1:25 PM

## 2015-05-30 ENCOUNTER — Other Ambulatory Visit: Payer: Self-pay | Admitting: Family Medicine

## 2015-05-30 DIAGNOSIS — R059 Cough, unspecified: Secondary | ICD-10-CM

## 2015-05-30 DIAGNOSIS — J948 Other specified pleural conditions: Secondary | ICD-10-CM

## 2015-05-30 DIAGNOSIS — R05 Cough: Secondary | ICD-10-CM

## 2015-05-31 LAB — QUANTIFERON TB GOLD ASSAY (BLOOD)
Interferon Gamma Release Assay: POSITIVE — AB
MITOGEN VALUE: 6.84 [IU]/mL
QUANTIFERON TB AG MINUS NIL: 6.97 [IU]/mL
Quantiferon Nil Value: 0.27 IU/mL
TB AG VALUE: 7.24 [IU]/mL

## 2015-06-03 DIAGNOSIS — Z0000 Encounter for general adult medical examination without abnormal findings: Secondary | ICD-10-CM | POA: Insufficient documentation

## 2015-06-03 DIAGNOSIS — H547 Unspecified visual loss: Secondary | ICD-10-CM | POA: Insufficient documentation

## 2015-06-03 DIAGNOSIS — R011 Cardiac murmur, unspecified: Secondary | ICD-10-CM | POA: Insufficient documentation

## 2015-06-03 NOTE — Assessment & Plan Note (Signed)
With malaise. 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 Will refer to health department for input and to pulmonology for surveillance.

## 2015-06-03 NOTE — Assessment & Plan Note (Signed)
Echo unremarkable, only trivial Mitral Valve Regurgitation.

## 2015-06-03 NOTE — Assessment & Plan Note (Signed)
Referred to opthamology for further intervention.

## 2015-06-03 NOTE — Assessment & Plan Note (Signed)
hgba1c acceptable, minimize simple carbs. Increase exercise as tolerated. Continue current meds 

## 2015-06-03 NOTE — Assessment & Plan Note (Signed)
Colonoscopy 2012 to repeat in 2017

## 2015-06-03 NOTE — Assessment & Plan Note (Signed)
Tolerating statin, encouraged heart healthy diet, avoid trans fats, minimize simple carbs and saturated fats. Increase exercise as tolerated 

## 2015-06-03 NOTE — Assessment & Plan Note (Signed)
Patient denies any difficulties at home. No trouble with ADLs, depression or falls. No recent changes to vision or hearing. Is UTD with immunizations. Is UTD with screening. Discussed Advanced Directives, patient agrees to bring [place] copies of documents if can. Encouraged heart healthy diet, exercise as tolerated and adequate sleep. Labs ordered and reviewed Sees Dr Henrene Pastor of [facility], last colonoscopy 08/2011 repeat in 5 years See problem list for risk factors, see AVS for preventative healthcare recommendations.

## 2015-06-13 ENCOUNTER — Telehealth: Payer: Self-pay | Admitting: Family Medicine

## 2015-06-13 NOTE — Telephone Encounter (Signed)
Caller name: Manuela Schwartz from [facility]  Call back number: [phone] and fax [phone]    Reason for call:  Manuela Schwartz from [facility] stated x ray results faxed over by Kandis Fantasia from [facility] 05/21/15 stated they need the actually imaging report. Please advise

## 2015-06-18 ENCOUNTER — Encounter: Payer: Self-pay | Admitting: Internal Medicine

## 2015-06-18 ENCOUNTER — Ambulatory Visit (INDEPENDENT_AMBULATORY_CARE_PROVIDER_SITE_OTHER): Payer: Medicare Other | Admitting: Internal Medicine

## 2015-06-18 VITALS — BP 116/70 | HR 77 | Ht 67.0 in | Wt 156.8 lb

## 2015-06-18 DIAGNOSIS — J920 Pleural plaque with presence of asbestos: Secondary | ICD-10-CM | POA: Diagnosis not present

## 2015-06-18 DIAGNOSIS — R05 Cough: Secondary | ICD-10-CM | POA: Diagnosis not present

## 2015-06-18 DIAGNOSIS — R7612 Nonspecific reaction to cell mediated immunity measurement of gamma interferon antigen response without active tuberculosis: Secondary | ICD-10-CM | POA: Diagnosis not present

## 2015-06-18 DIAGNOSIS — R059 Cough, unspecified: Secondary | ICD-10-CM

## 2015-06-18 NOTE — Patient Instructions (Signed)
Recommend yearly cxr's unless there is a new cough or difficulty with breathing.  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  If cough worse first step is stop all oil based vitamins for at least a few weeks and take delsym cough syrup and come see [place] if not better in 2 weeks   TB symptoms are productive cough / night sweats/ weight loss and need to be reported to the health department right away

## 2015-06-18 NOTE — Progress Notes (Signed)
Subjective:    Patient ID: Marvin Mcdonald, male    DOB: 01-04-1941,  MRN: [id number]  HPI  74 yo [place] in [employer] x 15 years arrived [place] 1990s  Quit smoking around 1997   referred by Marvin Mcdonald for abn CT chest c/w previous asbestos exp so referred to pulmonary clinic 06/18/2015 with Pos Quantiferon being eval at [facility]  06/18/2015 1st [facility] office visit/ Marvin Mcdonald   Chief Complaint  Patient presents with  . Pulmonary Consult    Referred by Dr Marvin Mcdonald due to test results showing pleural calcification. Pt also states non-prod cough at times   pt denies any real change in cough x years day > noct never productive and apparently just mentioned it in passing at physical/ takes no meds for it > this led to cxr > ct Certain he received BCG / does not recall any previous tb testing/ no night sweats or hemoptysis/ excess or purulent  sputum production  No obvious other patterns in day to day or daytime variabilty or assoc sob  or cp or chest tightness, subjective wheeze overt sinus or hb symptoms. No unusual exp hx or h/o childhood pna/ asthma or knowledge of premature birth.  Sleeping ok without nocturnal  or early am exacerbation  of respiratory  c/o's or need for noct saba. Also denies any obvious fluctuation of symptoms with weather or environmental changes or other aggravating or alleviating factors except as outlined above   Current Medications, Allergies, Complete Past Medical History, Past Surgical History, Family History, and Social History were reviewed in [employer] record.             Review of Systems  Constitutional: Negative for fever, chills, activity change, appetite change and unexpected weight change.  HENT: Negative for congestion, dental problem, postnasal drip, rhinorrhea, sneezing, sore throat, trouble swallowing and voice change.   Eyes: Negative for visual disturbance.  Respiratory: Positive for cough. Negative for choking and  shortness of breath.   Cardiovascular: Negative for chest pain and leg swelling.  Gastrointestinal: Negative for nausea, vomiting and abdominal pain.  Genitourinary: Negative for difficulty urinating.  Musculoskeletal: Negative for arthralgias.  Skin: Negative for rash.  Psychiatric/Behavioral: Negative for behavioral problems and confusion.       Objective:   Physical Exam  Stoic [place] male very little english  Wt Readings from Last 3 Encounters:  06/18/15 156 lb 12.8 oz (71.124 kg)  05/21/15 155 lb 6 oz (70.478 kg)  11/13/13 152 lb 0.6 oz (68.965 kg)    Vital signs reviewed   HEENT: nl dentition, turbinates, and orophanx. Nl external ear canals without cough reflex   NECK :  without JVD/Nodes/TM/ nl carotid upstrokes bilaterally   LUNGS: no acc muscle use, clear to A and P bilaterally without cough on insp or exp maneuvers   CV:  RRR  no s3 or murmur or increase in P2, no edema   ABD:  soft and nontender with nl excursion in the supine position. No bruits or organomegaly, bowel sounds nl  MS:  warm without deformities, calf tenderness, cyanosis or clubbing  SKIN: warm and dry without lesions    NEURO:  alert, approp, no deficits    I personally reviewed images and agree with radiology impression as follows:  CTa 05/29/15 Small calcified pleural plaques are noted bilaterally suggesting asbestos exposure. No acute cardiopulmonary abnormality seen.              Assessment & Plan:

## 2015-06-19 ENCOUNTER — Encounter: Payer: Self-pay | Admitting: Internal Medicine

## 2015-06-19 DIAGNOSIS — J920 Pleural plaque with presence of asbestos: Secondary | ICD-10-CM | POA: Insufficient documentation

## 2015-06-19 DIAGNOSIS — R7612 Nonspecific reaction to cell mediated immunity measurement of gamma interferon antigen response without active tuberculosis: Secondary | ICD-10-CM | POA: Insufficient documentation

## 2015-06-19 NOTE — Assessment & Plan Note (Signed)
The most common causes of chronic cough in immunocompetent adults include the following: upper airway cough syndrome (UACS), previously referred to as postnasal drip syndrome (PNDS), which is caused by variety of rhinosinus conditions; (2) asthma; (3) GERD; (4) chronic bronchitis from cigarette smoking or other inhaled environmental irritants; (5) nonasthmatic eosinophilic bronchitis; and (6) bronchiectasis.   These conditions, singly or in combination, have accounted for up to 94% of the causes of chronic cough in prospective studies.   Other conditions have constituted no >6% of the causes in prospective studies These have included bronchogenic carcinoma, chronic interstitial pneumonia, sarcoidosis, left ventricular failure, ACEI-induced cough, and aspiration from a condition associated with pharyngeal dysfunction.    Chronic cough is often simultaneously caused by more than one condition. A single cause has been found from 38 to 82% of the time, multiple causes from 18 to 62%. Multiply caused cough has been the result of three diseases up to 42% of the time.       Based on hx and exam, this is most likely:  Classic Upper airway cough syndrome, so named because it's frequently impossible to sort out how much is  CR/sinusitis with freq throat clearing (which can be related to primary GERD)   vs  causing  secondary (" extra esophageal")  GERD from wide swings in gastric pressure that occur with throat clearing, often  promoting self use of mint and menthol lozenges that reduce the lower esophageal sphincter tone and exacerbate the problem further in a cyclical fashion.   These are the same pts (now being labeled as having "irritable larynx syndrome" by some cough centers) who not infrequently have a history of having failed to tolerate ace inhibitors,  dry powder inhalers or biphosphonates or report having atypical reflux symptoms that don't respond to standard doses of PPI , and are easily confused as  having aecopd or asthma flares by even experienced allergists/ pulmonologists.   The first step is to eliminate any med/ activity that promotes uacs starting with oil based vitamins and completing health dept w/u for ? latent tb

## 2015-06-19 NOTE — Assessment & Plan Note (Signed)
Pos 05/29/15 > w/u by health dept   This titer seems very high for BCG exposure and in the absence of any signs or symptoms of active pulmonary TB I will defer the decision over whether to treat for "new conversion" (if this can be determined) / latent TB by [facility] de[facility].

## 2015-06-19 NOTE — Assessment & Plan Note (Signed)
Exposure to asbestos in [employer] x 15 years last in 1970s See CT chest  05/29/15   Discussed with pt (via interpreter) and daughter who speaks English > this is benign but a def marker of asbestos exp with risk of meosthelioma and lung cancer but nothing to rec except for observation for new resp complaints and cxr yearly, sooner if directed by symptoms  Total time = 70m review case with pt/ discussion/ counseling/ giving and going over instructions (see avs)

## 2015-06-21 ENCOUNTER — Telehealth: Payer: Self-pay | Admitting: Behavioral Health

## 2015-06-21 NOTE — Telephone Encounter (Signed)
Caller: Dr. Lynann Beaver, Medical Director for [facility]  Reason for call: Follow-up with a lab  Dr. Patience Musca is over TB control for the county and does not feel comfortable administering medications to the patient with uncertainty about a possible lymph node of the neck dating back to 2014. Informed Dr. Patience Musca of  the provider's recommendations below.  Notes Recorded by Mosie Lukes, MD on 10/06/2013 at 1:02 PM Notify thyroid looks normal but they do see a spot behind his thyroid on the right which might be a parathyroid lesion or a lymph node they are recommending we get a pth lab and a CT of neck I will order if they agree  It appears that the patient did not have the recommended lab/CT of neck completed; this information was given to Dr. Patience Musca as well. She understood and did not have any further questions.  Per Dr. Patience Musca, she will have her nurse follow-up with Dr. Charlett Blake.

## 2015-06-23 ENCOUNTER — Other Ambulatory Visit: Payer: Self-pay | Admitting: Family Medicine

## 2015-06-23 DIAGNOSIS — R7612 Nonspecific reaction to cell mediated immunity measurement of gamma interferon antigen response without active tuberculosis: Secondary | ICD-10-CM

## 2015-06-23 DIAGNOSIS — R221 Localized swelling, mass and lump, neck: Secondary | ICD-10-CM

## 2015-06-23 DIAGNOSIS — R05 Cough: Secondary | ICD-10-CM

## 2015-06-23 DIAGNOSIS — R591 Generalized enlarged lymph nodes: Secondary | ICD-10-CM

## 2015-06-23 DIAGNOSIS — R059 Cough, unspecified: Secondary | ICD-10-CM

## 2015-06-23 NOTE — Telephone Encounter (Signed)
OK please clarify with her that she does not want to administer treatment but she does believe he needs treatment? If so I will will refer to ID I have also ordered a PTH and CT neck for the patient to further investigate thyroid, LN as recommended. Please let patient knowr

## 2015-06-24 NOTE — Telephone Encounter (Signed)
Unable to reach patient at this time. Left message for patient to return call when available.    

## 2015-06-28 NOTE — Telephone Encounter (Signed)
Spoke with Dr. Patience Musca and informed her of Dr. Frederik Pear recommendations below: OK please clarify with her that she does not want to administer treatment but she does believe he needs treatment? If so I will will refer to ID  I have also ordered a PTH and CT neck for the patient to further investigate thyroid, LN as recommended. Please let patient knowr   Per Dr. Patience Musca, she verbalized that it will be great if the patient could have the above labs completed that way she can determine whether patient can be treated for active TB or latent. Informed her that the patient was called, regarding  the requested labs, but we were not able to reach him at this time.  She has requested for the provider to contact her directly at [phone].

## 2015-06-28 NOTE — Telephone Encounter (Signed)
Attempted to reach patient again via the Interpreter line.

## 2015-06-28 NOTE — Telephone Encounter (Signed)
Spoke with the patient's daughter, Tammie Ellsworth and confirmed appointment for 07/01/15 at 10:00 AM to have CT neck scan completed and PTH lab at 9:30 AM on the same day.

## 2015-07-01 ENCOUNTER — Other Ambulatory Visit (INDEPENDENT_AMBULATORY_CARE_PROVIDER_SITE_OTHER): Payer: Medicare Other

## 2015-07-01 ENCOUNTER — Ambulatory Visit (HOSPITAL_BASED_OUTPATIENT_CLINIC_OR_DEPARTMENT_OTHER)
Admission: RE | Admit: 2015-07-01 | Discharge: 2015-07-01 | Disposition: A | Discharge status: Home / Self Care | Payer: Medicare Other | Source: Ambulatory Visit | Attending: Family Medicine | Admitting: Family Medicine

## 2015-07-01 ENCOUNTER — Encounter (HOSPITAL_BASED_OUTPATIENT_CLINIC_OR_DEPARTMENT_OTHER): Payer: Self-pay

## 2015-07-01 DIAGNOSIS — R591 Generalized enlarged lymph nodes: Secondary | ICD-10-CM | POA: Diagnosis not present

## 2015-07-01 DIAGNOSIS — R059 Cough, unspecified: Secondary | ICD-10-CM

## 2015-07-01 DIAGNOSIS — R05 Cough: Secondary | ICD-10-CM | POA: Insufficient documentation

## 2015-07-01 DIAGNOSIS — R599 Enlarged lymph nodes, unspecified: Secondary | ICD-10-CM | POA: Diagnosis not present

## 2015-07-01 DIAGNOSIS — M47892 Other spondylosis, cervical region: Secondary | ICD-10-CM | POA: Diagnosis not present

## 2015-07-01 DIAGNOSIS — M5032 Other cervical disc degeneration, mid-cervical region: Secondary | ICD-10-CM | POA: Diagnosis not present

## 2015-07-01 LAB — COMPREHENSIVE METABOLIC PANEL
ALBUMIN: 4.2 g/dL (ref 3.5–5.2)
ALK PHOS: 68 U/L (ref 39–117)
ALT: 30 U/L (ref 0–53)
AST: 20 U/L (ref 0–37)
BUN: 13 mg/dL (ref 6–23)
CALCIUM: 9.2 mg/dL (ref 8.4–10.5)
CO2: 25 mEq/L (ref 19–32)
Chloride: 107 mEq/L (ref 96–112)
Creatinine, Ser: 0.93 mg/dL (ref 0.40–1.50)
GFR: 84.34 mL/min (ref >60.00)
Glucose, Bld: 112 mg/dL — ABNORMAL HIGH (ref 70–99)
POTASSIUM: 4 meq/L (ref 3.5–5.1)
Sodium: 139 mEq/L (ref 135–145)
TOTAL PROTEIN: 7.5 g/dL (ref 6.0–8.3)
Total Bilirubin: 0.7 mg/dL (ref 0.2–1.2)

## 2015-07-01 MED ORDER — IOHEXOL 300 MG/ML  SOLN
75.0000 mL | Freq: Once | INTRAMUSCULAR | Status: AC | PRN
Start: 2015-07-01 — End: 2015-07-01
  Administered 2015-07-01: 75 mL via INTRAVENOUS

## 2015-07-02 LAB — PTH, INTACT AND CALCIUM
Calcium: 8.9 mg/dL (ref 8.4–10.5)
PTH: 21 pg/mL (ref 14–64)

## 2015-07-29 ENCOUNTER — Other Ambulatory Visit: Payer: Self-pay | Admitting: Family Medicine

## 2015-07-29 MED ORDER — SIMVASTATIN 20 MG PO TABS: 20.0000 mg | ORAL_TABLET | Freq: Every day | ORAL | Status: DC

## 2015-10-01 ENCOUNTER — Telehealth: Payer: Self-pay | Admitting: Family Medicine

## 2015-10-14 NOTE — Telephone Encounter (Signed)
error 

## 2016-01-08 ENCOUNTER — Telehealth: Payer: Self-pay | Admitting: Family Medicine

## 2016-01-08 NOTE — Telephone Encounter (Signed)
As per daughter patient received flu shot  December 2016 will call back regarding where

## 2016-01-08 NOTE — Telephone Encounter (Signed)
Pt's daughter call back in to return call. She says that father had flu shot in October at church by the nurse.

## 2016-01-09 NOTE — Telephone Encounter (Signed)
Documented flu shot

## 2016-02-06 ENCOUNTER — Other Ambulatory Visit: Payer: Self-pay | Admitting: Family Medicine

## 2016-05-21 ENCOUNTER — Other Ambulatory Visit: Payer: Self-pay | Admitting: Family Medicine

## 2016-05-28 LAB — HM DIABETES FOOT EXAM

## 2016-07-17 ENCOUNTER — Encounter: Payer: Self-pay | Admitting: Family Medicine

## 2016-08-12 ENCOUNTER — Encounter: Payer: Self-pay | Admitting: *Deleted

## 2016-08-12 ENCOUNTER — Encounter: Payer: Self-pay | Admitting: Internal Medicine

## 2016-08-28 ENCOUNTER — Other Ambulatory Visit: Payer: Self-pay | Admitting: Family Medicine

## 2016-09-29 ENCOUNTER — Ambulatory Visit (INDEPENDENT_AMBULATORY_CARE_PROVIDER_SITE_OTHER): Payer: Medicare Other | Admitting: Family Medicine

## 2016-09-29 ENCOUNTER — Encounter: Payer: Self-pay | Admitting: Family Medicine

## 2016-09-29 DIAGNOSIS — J209 Acute bronchitis, unspecified: Secondary | ICD-10-CM

## 2016-09-29 DIAGNOSIS — I10 Essential (primary) hypertension: Secondary | ICD-10-CM | POA: Diagnosis not present

## 2016-09-29 DIAGNOSIS — E119 Type 2 diabetes mellitus without complications: Secondary | ICD-10-CM | POA: Diagnosis not present

## 2016-09-29 MED ORDER — AZITHROMYCIN 250 MG PO TABS: ORAL_TABLET | ORAL | 0 refills | Status: DC

## 2016-09-29 NOTE — Progress Notes (Signed)
Pre visit review using our clinic review tool, if applicable. No additional management support is needed unless otherwise documented below in the visit note. 

## 2016-09-29 NOTE — Patient Instructions (Addendum)
Plain Mucinex/ Guaifenasin 400 to 600 mg twice daily x 10   Coldeeze daily for zinc Vitamin C 500 to 1000 mg daily EmergenC is a good choice   Acute Bronchitis, Adult Acute bronchitis is sudden (acute) swelling of the air tubes (bronchi) in the lungs. Acute bronchitis causes these tubes to fill with mucus, which can make it hard to breathe. It can also cause coughing or wheezing. In adults, acute bronchitis usually goes away within 2 weeks. A cough caused by bronchitis may last up to 3 weeks. Smoking, allergies, and asthma can make the condition worse. Repeated episodes of bronchitis may cause further lung problems, such as chronic obstructive pulmonary disease (COPD). What are the causes? This condition can be caused by germs and by substances that irritate the lungs, including:  Cold and flu viruses. This condition is most often caused by the same virus that causes a cold.  Bacteria.  Exposure to tobacco smoke, dust, fumes, and air pollution. What increases the risk? This condition is more likely to develop in people who:  Have close contact with someone with acute bronchitis.  Are exposed to lung irritants, such as tobacco smoke, dust, fumes, and vapors.  Have a weak immune system.  Have a respiratory condition such as asthma. What are the signs or symptoms? Symptoms of this condition include:  A cough.  Coughing up clear, yellow, or green mucus.  Wheezing.  Chest congestion.  Shortness of breath.  A fever.  Body aches.  Chills.  A sore throat. How is this diagnosed? This condition is usually diagnosed with a physical exam. During the exam, your health care provider may order tests, such as chest X-rays, to rule out other conditions. He or she may also:  Test a sample of your mucus for bacterial infection.  Check the level of oxygen in your blood. This is done to check for pneumonia.  Do a chest X-ray or lung function testing to rule out pneumonia and other  conditions.  Perform blood tests. Your health care provider will also ask about your symptoms and medical history. How is this treated? Most cases of acute bronchitis clear up over time without treatment. Your health care provider may recommend:  Drinking more fluids. Drinking more makes your mucus thinner, which may make it easier to breathe.  Taking a medicine for a fever or cough.  Taking an antibiotic medicine.  Using an inhaler to help improve shortness of breath and to control a cough.  Using a cool mist vaporizer or humidifier to make it easier to breathe. Follow these instructions at home: Medicines  Take over-the-counter and prescription medicines only as told by your health care provider.  If you were prescribed an antibiotic, take it as told by your health care provider. Do not stop taking the antibiotic even if you start to feel better. General instructions  Get plenty of rest.  Drink enough fluids to keep your urine clear or pale yellow.  Avoid smoking and secondhand smoke. Exposure to cigarette smoke or irritating chemicals will make bronchitis worse. If you smoke and you need help quitting, ask your health care provider. Quitting smoking will help your lungs heal faster.  Use an inhaler, cool mist vaporizer, or humidifier as told by your health care provider.  Keep all follow-up visits as told by your health care provider. This is important. How is this prevented? To lower your risk of getting this condition again:  Wash your hands often with soap and water. If soap  and water are not available, use hand sanitizer.  Avoid contact with people who have cold symptoms.  Try not to touch your hands to your mouth, nose, or eyes.  Make sure to get the flu shot every year. Contact a health care provider if:  Your symptoms do not improve in 2 weeks of treatment. Get help right away if:  You cough up blood.  You have chest pain.  You have severe shortness of  breath.  You become dehydrated.  You faint or keep feeling like you are going to faint.  You keep vomiting.  You have a severe headache.  Your fever or chills gets worse. This information is not intended to replace advice given to you by your health care provider. Make sure you discuss any questions you have with your health care provider. Document Released: 11/26/2004 Document Revised: 05/13/2016 Document Reviewed: 04/08/2016 Elsevier Interactive Patient Education  2017 [employer]. days

## 2016-10-11 NOTE — Progress Notes (Signed)
Patient ID: Marvin Mcdonald, male   DOB: [birth year], 75 y.o.   MRN: [id number]   Subjective:    Patient ID: Marvin Mcdonald, male    DOB: [birth year], 75 y.o.   MRN: [id number]  Chief Complaint  Patient presents with  . Cough  . Nasal Congestion    HPI Patient is in today for evaluatio nof a cough. He is accompanied by an interpretor and they note a roughly 10 day history of head and chest congestion. He denies headache or fevers/chills. No fatigue or myalgias. Cough is productive of sputum. Denies CP/palp/SOB/HA/fevers/GI or GU c/o. Taking meds as prescribed.  Past Medical History:  Diagnosis Date  . Acute URI 01/03/2013  . BPH (benign prostatic hypertrophy)   . Colon polyps   . Dental infection 10/07/2013  . Diabetes mellitus    diet and exercise controlled  . Hematuria 04/07/2013  . Hyperlipidemia   . Low back pain   . Mass of neck 10/07/2013  . Osteoporosis   . Pain in joint, shoulder region 10/07/2013    Past Surgical History:  Procedure Laterality Date  . COLONOSCOPY    . HAND SURGERY    . POLYPECTOMY      Family History  Problem Relation Age of Onset  . Colon cancer Neg Hx   . Stomach cancer Neg Hx   . Esophageal cancer Neg Hx     Social History   Social History  . Marital status: Married    Spouse name: Young  . Number of children: N/A  . Years of education: N/A   Occupational History  .  [employer]    Social History Main Topics  . Smoking status: Former Smoker    Packs/day: 0.50    Years: 25.00    Types: Cigarettes    Quit date: 07/27/1996  . Smokeless tobacco: Never Used  . Alcohol use No  . Drug use: No  . Sexual activity: Not on file   Other Topics Concern  . Not on file   Social History Narrative   Supportive daughter    Outpatient Medications Prior to Visit  Medication Sig Dispense Refill  . aspirin 81 MG tablet Take 81 mg by mouth daily.      . Calcium Carbonate (CALCIUM 500 PO) Take by mouth 2 (two) times daily.      . Ginger,  Zingiber officinalis, (GINGER ROOT PO) Take by mouth.      . methocarbamol (ROBAXIN) 500 MG tablet Take 1 tablet (500 mg total) by mouth at bedtime as needed for muscle spasms. 30 tablet 0  . Multiple Vitamin (MULTIVITAMIN) capsule Take 1 capsule by mouth daily.      . ONE TOUCH ULTRA TEST test strip USE TO TEST BLOOD SUGAR EVERY DAY 100 each 3  . simvastatin (ZOCOR) 20 MG tablet TAKE 1 TABLET BY MOUTH EVERY NIGHT AT BEDTIME 90 tablet 0  . vitamin C (ASCORBIC ACID) 500 MG tablet Take 1,000 mg by mouth 2 (two) times daily.      No facility-administered medications prior to visit.     No Known Allergies  Review of Systems  Constitutional: Positive for malaise/fatigue. Negative for fever.  HENT: Positive for congestion.   Eyes: Negative for blurred vision.  Respiratory: Positive for cough and sputum production. Negative for shortness of breath.   Cardiovascular: Negative for chest pain, palpitations and leg swelling.  Gastrointestinal: Negative for abdominal pain, blood in stool and nausea.  Genitourinary: Negative for dysuria and frequency.  Musculoskeletal:  Negative for falls.  Skin: Negative for rash.  Neurological: Negative for dizziness, loss of consciousness and headaches.  Endo/Heme/Allergies: Negative for environmental allergies.  Psychiatric/Behavioral: Negative for depression. The patient is not nervous/anxious.        Objective:    Physical Exam  Constitutional: He is oriented to person, place, and time. He appears well-developed and well-nourished. No distress.  HENT:  Head: Normocephalic and atraumatic.  Nose: Nose normal.  Eyes: Right eye exhibits no discharge. Left eye exhibits no discharge.  Neck: Normal range of motion. Neck supple.  Cardiovascular: Normal rate and regular rhythm.   No murmur heard. Pulmonary/Chest: Effort normal and breath sounds normal.  Decreased breath sounds b/l bases  Abdominal: Soft. Bowel sounds are normal. There is no tenderness.    Musculoskeletal: He exhibits no edema.  Neurological: He is alert and oriented to person, place, and time.  Skin: Skin is warm and dry.  Psychiatric: He has a normal mood and affect.  Nursing note and vitals reviewed.   BP 128/70 (BP Location: Left Arm, Patient Position: Sitting, Cuff Size: Normal)   Pulse 76   Temp 97.7 F (36.5 C) (Oral)   Ht 5\' 7"  (1.702 m)   Wt 154 lb 8 oz (70.1 kg)   SpO2 96%   BMI 24.20 kg/m  Wt Readings from Last 3 Encounters:  09/29/16 154 lb 8 oz (70.1 kg)  06/18/15 156 lb 12.8 oz (71.1 kg)  05/21/15 155 lb 6 oz (70.5 kg)     Lab Results  Component Value Date   WBC 5.9 05/21/2015   HGB 14.1 05/21/2015   HCT 41.2 05/21/2015   PLT 181.0 05/21/2015   GLUCOSE 112 (H) 07/01/2015   CHOL 168 05/21/2015   TRIG 206.0 (H) 05/21/2015   HDL 37.70 (L) 05/21/2015   LDLDIRECT 101.0 05/21/2015   LDLCALC 58 10/05/2013   ALT 30 07/01/2015   AST 20 07/01/2015   NA 139 07/01/2015   K 4.0 07/01/2015   CL 107 07/01/2015   CREATININE 0.93 07/01/2015   BUN 13 07/01/2015   CO2 25 07/01/2015   TSH 1.91 05/21/2015   PSA 1.11 08/29/2010   HGBA1C 6.3 05/21/2015   MICROALBUR 1.1 05/21/2015    Lab Results  Component Value Date   TSH 1.91 05/21/2015   Lab Results  Component Value Date   WBC 5.9 05/21/2015   HGB 14.1 05/21/2015   HCT 41.2 05/21/2015   MCV 99.4 05/21/2015   PLT 181.0 05/21/2015   Lab Results  Component Value Date   NA 139 07/01/2015   K 4.0 07/01/2015   CO2 25 07/01/2015   GLUCOSE 112 (H) 07/01/2015   BUN 13 07/01/2015   CREATININE 0.93 07/01/2015   BILITOT 0.7 07/01/2015   ALKPHOS 68 07/01/2015   AST 20 07/01/2015   ALT 30 07/01/2015   PROT 7.5 07/01/2015   ALBUMIN 4.2 07/01/2015   CALCIUM 9.2 07/01/2015   CALCIUM 8.9 07/01/2015   GFR 84.34 07/01/2015   Lab Results  Component Value Date   CHOL 168 05/21/2015   Lab Results  Component Value Date   HDL 37.70 (L) 05/21/2015   Lab Results  Component Value Date   LDLCALC  58 10/05/2013   Lab Results  Component Value Date   TRIG 206.0 (H) 05/21/2015   Lab Results  Component Value Date   CHOLHDL 4 05/21/2015   Lab Results  Component Value Date   HGBA1C 6.3 05/21/2015       Assessment & Plan:  Problem List Items Addressed This Visit    Essential hypertension    Well controlled, no changes to meds. Encouraged heart healthy diet such as the DASH diet and exercise as tolerated.       Diabetes type 2, controlled ([place])    minimize simple carbs. Increase exercise as tolerated. Continue current meds      Acute bronchitis    Encouraged increased rest and hydration, add probiotics, zinc such as Coldeze or Xicam. Treat fevers as needed. Started on antibiotics, probiotics and Mucinex         I am having Mr. Ridley start on azithromycin. I am also having him maintain his aspirin, Calcium Carbonate (CALCIUM 500 PO), multivitamin, vitamin C, (Ginger, Zingiber officinalis, (GINGER ROOT PO)), ONE TOUCH ULTRA TEST, methocarbamol, and simvastatin.  Meds ordered this encounter  Medications  . azithromycin (ZITHROMAX) 250 MG tablet    Sig: 2 tabs po once then 1 tab po daily x 4 days    Dispense:  6 tablet    Refill:  0     Penni Homans, MD

## 2016-10-11 NOTE — Assessment & Plan Note (Signed)
Well controlled, no changes to meds. Encouraged heart healthy diet such as the DASH diet and exercise as tolerated.  °

## 2016-10-11 NOTE — Assessment & Plan Note (Signed)
minimize simple carbs. Increase exercise as tolerated. Continue current meds  

## 2016-10-11 NOTE — Assessment & Plan Note (Signed)
Encouraged increased rest and hydration, add probiotics, zinc such as Coldeze or Xicam. Treat fevers as needed. Started on antibiotics, probiotics and Mucinex

## 2016-10-12 NOTE — Progress Notes (Signed)
Subjective:   Marvin Mcdonald is a 75 y.o. male who presents for Medicare Annual/Subsequent preventive examination.  Review of Systems:  No ROS.  Medicare Wellness Visit.  Cardiac Risk Factors include: advanced age (>73men, >68 women);dyslipidemia;male gender;diabetes mellitus  Sleep patterns: no sleep issues. 6-8 hrs nightly. Gets up 3x nightly to void.  Home Safety/Smoke Alarms: Feels safe in home. Smoke alarms in place.    Living environment; residence and Firearm Safety: Lives in home w/ wife. Has twin 71 year old grandchildren who stay with them a few times per week.  Seat Belt Safety/Bike Helmet: Wears seat belt.   Counseling:   Eye Exam- Does not follow w/ eye doctor regularly. Wears reading glasses.  Dental- Sees dentist every 6 months for cleaning.    Male:   CCS- last 08/11/11. 4 polyps removed, otherwise removed. 5 year recall.     PSA-  Lab Results  Component Value Date   PSA 1.11 08/29/2010   PSA 1.05 01/28/2007   PSA 1.05 01/28/2007          Objective:    Vitals: BP 134/78 (BP Location: Left Arm, Patient Position: Sitting, Cuff Size: Normal)   Pulse 78   Resp 16   Ht 5\' 7"  (1.702 m)   Wt 155 lb 6.4 oz (70.5 kg)   SpO2 98%   BMI 24.34 kg/m   Body mass index is 24.34 kg/m.  Tobacco History  Smoking Status  . Former Smoker  . Packs/day: 0.50  . Years: 25.00  . Types: Cigarettes  . Quit date: 07/27/1996  Smokeless Tobacco  . Never Used     Counseling given: Not Answered   Past Medical History:  Diagnosis Date  . Acute URI 01/03/2013  . BPH (benign prostatic hypertrophy)   . Colon polyps   . Dental infection 10/07/2013  . Diabetes mellitus    diet and exercise controlled  . Hematuria 04/07/2013  . Hyperlipidemia   . Low back pain   . Mass of neck 10/07/2013  . Osteoporosis   . Pain in joint, shoulder region 10/07/2013   Past Surgical History:  Procedure Laterality Date  . COLONOSCOPY    . HAND SURGERY    . POLYPECTOMY     Family History    Problem Relation Age of Onset  . Colon cancer Neg Hx   . Stomach cancer Neg Hx   . Esophageal cancer Neg Hx    History  Sexual Activity  . Sexual activity: Not on file    Outpatient Encounter Prescriptions as of 10/13/2016  Medication Sig  . aspirin 81 MG tablet Take 81 mg by mouth daily.    . Calcium Carbonate (CALCIUM 500 PO) Take by mouth 2 (two) times daily.    . Ginger, Zingiber officinalis, (GINGER ROOT PO) Take by mouth.    . methocarbamol (ROBAXIN) 500 MG tablet Take 1 tablet (500 mg total) by mouth at bedtime as needed for muscle spasms.  . Multiple Vitamin (MULTIVITAMIN) capsule Take 1 capsule by mouth daily.    . ONE TOUCH ULTRA TEST test strip USE TO TEST BLOOD SUGAR EVERY DAY  . simvastatin (ZOCOR) 20 MG tablet TAKE 1 TABLET BY MOUTH EVERY NIGHT AT BEDTIME  . vitamin C (ASCORBIC ACID) 500 MG tablet Take 1,000 mg by mouth 2 (two) times daily.   . [DISCONTINUED] azithromycin (ZITHROMAX) 250 MG tablet 2 tabs po once then 1 tab po daily x 4 days (Patient not taking: Reported on 10/13/2016)   No facility-administered  encounter medications on file as of 10/13/2016.     Activities of Daily Living In your present state of health, do you have any difficulty performing the following activities: 10/13/2016  Hearing? N  Vision? N  Difficulty concentrating or making decisions? N  Walking or climbing stairs? N  Dressing or bathing? N  Doing errands, shopping? N  Preparing Food and eating ? N  Using the Toilet? N  In the past six months, have you accidently leaked urine? N  Do you have problems with loss of bowel control? N  Managing your Medications? N  Managing your Finances? N  Housekeeping or managing your Housekeeping? N  Some recent data might be hidden    Patient Care Team: Mosie Lukes, MD as PCP - General (Family Medicine)   Assessment:    Physical assessment deferred to PCP.  Hearing/Vision: Hearing Screening Comments: Able to hear conversational tones  w/o difficulty. No issues reported.  Vision Screening Comments: No vision issues. Does not see eye doctor regularly. Wears reading glasses.  Exercise Activities and Dietary recommendations Current Exercise Habits: Home exercise routine, Type of exercise: walking (Tennis), Time (Minutes): 30, Frequency (Times/Week): 7, Weekly Exercise (Minutes/Week): 210   Diet (meal preparation, eat out, water intake, caffeinated beverages, dairy products, fruits and vegetables): in general, a "healthy" diet  , well balanced, on average, 2-3 meals per day. Meals vary, eats lots of rice. Drinks water throughout the day. States "I'm a young man."   Goals    . Continue playing tennis.      Fall Risk Fall Risk  10/13/2016 05/21/2015 10/05/2013  Falls in the past year? No No Yes  Number falls in past yr: - - 1   Depression Screen PHQ 2/9 Scores 10/13/2016 05/21/2015 10/05/2013  PHQ - 2 Score 0 0 0    Cognitive Function MMSE - Mini Mental State Exam 10/13/2016  Orientation to time 5  Orientation to Place 5  Registration 3  Attention/ Calculation 5  Recall 3  Language- name 2 objects 2  Language- repeat 1  Language- follow 3 step command 3  Language- read & follow direction 0  Write a sentence 1  Copy design 1  Total score 29        Immunization History  Administered Date(s) Administered  . Influenza Split 08/12/2016  . Influenza Whole 08/18/2004, 07/23/2009, 07/18/2010  . Pneumococcal Conjugate-13 10/05/2013  . Pneumococcal Polysaccharide-23 07/21/2004, 10/13/2016  . Tdap 05/05/2011  . Zoster 04/02/2006   Screening Tests Health Maintenance  Topic Date Due  . OPHTHALMOLOGY EXAM  11/10/2001  . HEMOGLOBIN A1C  11/21/2015  . URINE MICROALBUMIN  05/20/2016  . COLONOSCOPY  08/10/2016  . FOOT EXAM  05/28/2017  . TETANUS/TDAP  05/04/2021  . INFLUENZA VACCINE  Completed  . ZOSTAVAX  Completed  . PNA vac Low Risk Adult  Completed      Plan:   Follow-up w/ PCP as scheduled. Return for  labs in January, orders placed per PCP.  Continue to eat heart healthy diet (full of fruits, vegetables, whole grains, lean protein, water--limit salt, fat, and sugar intake) and increase physical activity as tolerated. Continue doing brain stimulating activities (puzzles, reading, adult coloring books, staying active) to keep memory sharp.  Will consider repeat colonoscopy and follow-up w/ PCP at next appt.   During the course of the visit the patient was educated and counseled about the following appropriate screening and preventive services:   Vaccines to include Pneumoccal, Influenza, Hepatitis B, Td, Zostavax, HCV  Cardiovascular Disease  Colorectal cancer screening  Diabetes screening  Prostate Cancer Screening  Glaucoma screening  Nutrition counseling   Patient Instructions (the written plan) was given to the patient.    Dorrene German, RN  10/13/2016

## 2016-10-12 NOTE — Progress Notes (Signed)
Pre visit review using our clinic review tool, if applicable. No additional management support is needed unless otherwise documented below in the visit note. 

## 2016-10-13 ENCOUNTER — Ambulatory Visit (INDEPENDENT_AMBULATORY_CARE_PROVIDER_SITE_OTHER): Payer: Medicare Other | Admitting: *Deleted

## 2016-10-13 ENCOUNTER — Encounter: Payer: Self-pay | Admitting: *Deleted

## 2016-10-13 VITALS — BP 134/78 | HR 78 | Resp 16 | Ht 67.0 in | Wt 155.4 lb

## 2016-10-13 DIAGNOSIS — Z23 Encounter for immunization: Secondary | ICD-10-CM

## 2016-10-13 DIAGNOSIS — Z0000 Encounter for general adult medical examination without abnormal findings: Secondary | ICD-10-CM

## 2016-10-13 DIAGNOSIS — E119 Type 2 diabetes mellitus without complications: Secondary | ICD-10-CM

## 2016-10-13 DIAGNOSIS — I10 Essential (primary) hypertension: Secondary | ICD-10-CM

## 2016-10-13 DIAGNOSIS — E785 Hyperlipidemia, unspecified: Secondary | ICD-10-CM

## 2016-10-13 NOTE — Progress Notes (Signed)
RN INR check note reviewed. Agree with documention and plan. 

## 2016-10-13 NOTE — Patient Instructions (Signed)
  Mr. Mulkins , Thank you for taking time to come for your Medicare Wellness Visit. I appreciate your ongoing commitment to your health goals. Please review the following plan we discussed and let me know if I can assist you in the future.  You got your PPSV-23 vaccine today.   These are the goals we discussed: Goals    . Continue playing tennis.       This is a list of the screening recommended for you and due dates:  Health Maintenance  Topic Date Due  . Eye exam for diabetics  11/10/2001  . Pneumonia vaccines (2 of 2 - PPSV23) 10/05/2014  . Hemoglobin A1C  11/21/2015  . Urine Protein Check  05/20/2016  . Colon Cancer Screening  08/10/2016  . Complete foot exam   05/28/2017  . Tetanus Vaccine  05/04/2021  . Flu Shot  Completed  . Shingles Vaccine  Completed

## 2016-11-16 ENCOUNTER — Other Ambulatory Visit (INDEPENDENT_AMBULATORY_CARE_PROVIDER_SITE_OTHER): Payer: Medicare Other

## 2016-11-16 DIAGNOSIS — I10 Essential (primary) hypertension: Secondary | ICD-10-CM

## 2016-11-16 DIAGNOSIS — E119 Type 2 diabetes mellitus without complications: Secondary | ICD-10-CM

## 2016-11-16 DIAGNOSIS — E785 Hyperlipidemia, unspecified: Secondary | ICD-10-CM

## 2016-11-16 LAB — COMPREHENSIVE METABOLIC PANEL
ALK PHOS: 69 U/L (ref 39–117)
ALT: 32 U/L (ref 0–53)
AST: 24 U/L (ref 0–37)
Albumin: 4.3 g/dL (ref 3.5–5.2)
BUN: 10 mg/dL (ref 6–23)
CHLORIDE: 105 meq/L (ref 96–112)
CO2: 26 meq/L (ref 19–32)
Calcium: 9.6 mg/dL (ref 8.4–10.5)
Creatinine, Ser: 0.91 mg/dL (ref 0.40–1.50)
GFR: 86.16 mL/min (ref >60.00)
GLUCOSE: 121 mg/dL — AB (ref 70–99)
POTASSIUM: 3.9 meq/L (ref 3.5–5.1)
SODIUM: 138 meq/L (ref 135–145)
TOTAL PROTEIN: 7.7 g/dL (ref 6.0–8.3)
Total Bilirubin: 0.7 mg/dL (ref 0.2–1.2)

## 2016-11-16 LAB — LIPID PANEL
Cholesterol: 170 mg/dL (ref 0–200)
HDL: 36.8 mg/dL — AB (ref >39.00)
NonHDL: 133.01
Total CHOL/HDL Ratio: 5
Triglycerides: 305 mg/dL — ABNORMAL HIGH (ref 0.0–149.0)
VLDL: 61 mg/dL — AB (ref 0.0–40.0)

## 2016-11-16 LAB — CBC
HEMATOCRIT: 41.1 % (ref 39.0–52.0)
Hemoglobin: 14 g/dL (ref 13.0–17.0)
MCHC: 34.2 g/dL (ref 30.0–36.0)
MCV: 98.6 fl (ref 78.0–100.0)
Platelets: 186 10*3/uL (ref 150.0–400.0)
RBC: 4.17 Mil/uL — ABNORMAL LOW (ref 4.22–5.81)
RDW: 12.8 % (ref 11.5–15.5)
WBC: 5.9 10*3/uL (ref 4.0–10.5)

## 2016-11-16 LAB — TSH: TSH: 1.82 u[IU]/mL (ref 0.35–4.50)

## 2016-11-16 LAB — MICROALBUMIN / CREATININE URINE RATIO
Creatinine,U: 136.5 mg/dL
Microalb Creat Ratio: 0.7 mg/g (ref 0.0–30.0)
Microalb, Ur: 0.9 mg/dL (ref 0.0–1.9)

## 2016-11-16 LAB — LDL CHOLESTEROL, DIRECT: Direct LDL: 79 mg/dL

## 2016-11-16 LAB — HEMOGLOBIN A1C: HEMOGLOBIN A1C: 6.5 % (ref 4.6–6.5)

## 2016-11-17 ENCOUNTER — Other Ambulatory Visit: Payer: Self-pay | Admitting: Family Medicine

## 2016-11-17 ENCOUNTER — Telehealth: Payer: Self-pay | Admitting: Family Medicine

## 2016-11-17 DIAGNOSIS — E785 Hyperlipidemia, unspecified: Secondary | ICD-10-CM

## 2016-11-17 MED ORDER — SIMVASTATIN 40 MG PO TABS: 40.0000 mg | ORAL_TABLET | Freq: Every day | ORAL | 3 refills | Status: DC

## 2016-11-17 NOTE — Telephone Encounter (Signed)
DaHan,Marvin Mcdonald daughter requesting simvastatin (ZOCOR) 40 MG tablet please send to    [facility], [place] [phone] (Phone) [phone] (Fax)

## 2016-11-20 MED ORDER — SIMVASTATIN 40 MG PO TABS: 40.0000 mg | ORAL_TABLET | Freq: Every day | ORAL | 3 refills | Status: DC

## 2016-11-20 NOTE — Telephone Encounter (Signed)
Sent in to correct pharmacy.

## 2016-12-24 ENCOUNTER — Other Ambulatory Visit: Payer: Medicare Other

## 2017-02-17 ENCOUNTER — Other Ambulatory Visit: Payer: Medicare Other

## 2017-02-25 ENCOUNTER — Other Ambulatory Visit: Payer: Self-pay | Admitting: Family Medicine

## 2017-02-25 MED ORDER — SIMVASTATIN 40 MG PO TABS: 40.0000 mg | ORAL_TABLET | Freq: Every day | ORAL | 1 refills | Status: DC

## 2017-04-01 ENCOUNTER — Encounter: Payer: Self-pay | Admitting: Family Medicine

## 2017-04-01 ENCOUNTER — Ambulatory Visit (INDEPENDENT_AMBULATORY_CARE_PROVIDER_SITE_OTHER): Payer: Medicare Other | Admitting: Family Medicine

## 2017-04-01 VITALS — BP 129/71 | HR 99 | Temp 98.5°F | Ht 67.0 in | Wt 156.4 lb

## 2017-04-01 DIAGNOSIS — Z86010 Personal history of colonic polyps: Secondary | ICD-10-CM | POA: Diagnosis not present

## 2017-04-01 DIAGNOSIS — Z0001 Encounter for general adult medical examination with abnormal findings: Secondary | ICD-10-CM

## 2017-04-01 DIAGNOSIS — M810 Age-related osteoporosis without current pathological fracture: Secondary | ICD-10-CM

## 2017-04-01 DIAGNOSIS — Z1211 Encounter for screening for malignant neoplasm of colon: Secondary | ICD-10-CM

## 2017-04-01 DIAGNOSIS — E119 Type 2 diabetes mellitus without complications: Secondary | ICD-10-CM | POA: Diagnosis not present

## 2017-04-01 DIAGNOSIS — I10 Essential (primary) hypertension: Secondary | ICD-10-CM

## 2017-04-01 DIAGNOSIS — H04123 Dry eye syndrome of bilateral lacrimal glands: Secondary | ICD-10-CM | POA: Insufficient documentation

## 2017-04-01 DIAGNOSIS — E782 Mixed hyperlipidemia: Secondary | ICD-10-CM | POA: Diagnosis not present

## 2017-04-01 DIAGNOSIS — Z0000 Encounter for general adult medical examination without abnormal findings: Secondary | ICD-10-CM

## 2017-04-01 HISTORY — DX: Encounter for screening for malignant neoplasm of colon: Z12.11

## 2017-04-01 LAB — CBC
HEMATOCRIT: 41.2 % (ref 39.0–52.0)
HEMOGLOBIN: 14.2 g/dL (ref 13.0–17.0)
MCHC: 34.4 g/dL (ref 30.0–36.0)
MCV: 99 fl (ref 78.0–100.0)
Platelets: 172 10*3/uL (ref 150.0–400.0)
RBC: 4.16 Mil/uL — AB (ref 4.22–5.81)
RDW: 12.9 % (ref 11.5–15.5)
WBC: 5.6 10*3/uL (ref 4.0–10.5)

## 2017-04-01 LAB — COMPREHENSIVE METABOLIC PANEL
ALT: 29 U/L (ref 0–53)
AST: 24 U/L (ref 0–37)
Albumin: 4.5 g/dL (ref 3.5–5.2)
Alkaline Phosphatase: 78 U/L (ref 39–117)
BILIRUBIN TOTAL: 0.9 mg/dL (ref 0.2–1.2)
BUN: 14 mg/dL (ref 6–23)
CALCIUM: 9.4 mg/dL (ref 8.4–10.5)
CO2: 26 meq/L (ref 19–32)
CREATININE: 0.91 mg/dL (ref 0.40–1.50)
Chloride: 106 mEq/L (ref 96–112)
GFR: 86.07 mL/min (ref >60.00)
GLUCOSE: 128 mg/dL — AB (ref 70–99)
Potassium: 4 mEq/L (ref 3.5–5.1)
Sodium: 138 mEq/L (ref 135–145)
Total Protein: 7.6 g/dL (ref 6.0–8.3)

## 2017-04-01 LAB — LIPID PANEL
CHOL/HDL RATIO: 4
Cholesterol: 186 mg/dL (ref 0–200)
HDL: 42.4 mg/dL (ref >39.00)
LDL Cholesterol: 106 mg/dL — ABNORMAL HIGH (ref 0–99)
NONHDL: 143.64
Triglycerides: 189 mg/dL — ABNORMAL HIGH (ref 0.0–149.0)
VLDL: 37.8 mg/dL (ref 0.0–40.0)

## 2017-04-01 LAB — VITAMIN D 25 HYDROXY (VIT D DEFICIENCY, FRACTURES): VITD: 58.87 ng/mL (ref 30.00–100.00)

## 2017-04-01 LAB — TSH: TSH: 1.82 u[IU]/mL (ref 0.35–4.50)

## 2017-04-01 LAB — HEMOGLOBIN A1C: Hgb A1c MFr Bld: 7 % — ABNORMAL HIGH (ref 4.6–6.5)

## 2017-04-01 NOTE — Assessment & Plan Note (Signed)
Last colonoscopy in 2012 had 4 polyps.repeat colonoscopy recommended in 5 years. No bowel concerns at this time. He is returning home to [place] soon not to return until April of 2019 but  Agrees to proceed with colonoscopy when he returns.

## 2017-04-01 NOTE — Assessment & Plan Note (Signed)
hgba1c acceptable, minimize simple carbs. Increase exercise as tolerated. Continue current meds 

## 2017-04-01 NOTE — Assessment & Plan Note (Signed)
Describes sticky discharge and relief with warm compresses. With history of diabetes will refer to an opthammology

## 2017-04-01 NOTE — Progress Notes (Signed)
Pre visit review using our clinic review tool, if applicable. No additional management support is needed unless otherwise documented below in the visit note. 

## 2017-04-01 NOTE — Assessment & Plan Note (Signed)
Encouraged to get adequate exercise, calcium and vitamin d intake 

## 2017-04-01 NOTE — Patient Instructions (Addendum)
Preventive Care 76 Years and Older, Male Preventive care refers to lifestyle choices and visits with your health care provider that can promote health and wellness. What does preventive care include?  A yearly physical exam. This is also called an annual well check.  Dental exams once or twice a year.  Routine eye exams. Ask your health care provider how often you should have your eyes checked.  Personal lifestyle choices, including: ? Daily care of your teeth and gums. ? Regular physical activity. ? Eating a healthy diet. ? Avoiding tobacco and drug use. ? Limiting alcohol use. ? Practicing safe sex. ? Taking low doses of aspirin every day. ? Taking vitamin and mineral supplements as recommended by your health care provider. What happens during an annual well check? The services and screenings done by your health care provider during your annual well check will depend on your age, overall health, lifestyle risk factors, and family history of disease. Counseling Your health care provider may ask you questions about your:  Alcohol use.  Tobacco use.  Drug use.  Emotional well-being.  Home and relationship well-being.  Sexual activity.  Eating habits.  History of falls.  Memory and ability to understand (cognition).  Work and work environment.  Screening You may have the following tests or measurements:  Height, weight, and BMI.  Blood pressure.  Lipid and cholesterol levels. These may be checked every 5 years, or more frequently if you are over 50 years old.  Skin check.  Lung cancer screening. You may have this screening every year starting at age 55 if you have a 30-pack-year history of smoking and currently smoke or have quit within the past 15 years.  Fecal occult blood test (FOBT) of the stool. You may have this test every year starting at age 50.  Flexible sigmoidoscopy or colonoscopy. You may have a sigmoidoscopy every 5 years or a colonoscopy every 10  years starting at age 50.  Prostate cancer screening. Recommendations will vary depending on your family history and other risks.  Hepatitis C blood test.  Hepatitis B blood test.  Sexually transmitted disease (STD) testing.  Diabetes screening. This is done by checking your blood sugar (glucose) after you have not eaten for a while (fasting). You may have this done every 1-3 years.  Abdominal aortic aneurysm (AAA) screening. You may need this if you are a current or former smoker.  Osteoporosis. You may be screened starting at age 70 if you are at high risk.  Talk with your health care provider about your test results, treatment options, and if necessary, the need for more tests. Vaccines Your health care provider may recommend certain vaccines, such as:  Influenza vaccine. This is recommended every year.  Tetanus, diphtheria, and acellular pertussis (Tdap, Td) vaccine. You may need a Td booster every 10 years.  Varicella vaccine. You may need this if you have not been vaccinated.  Zoster vaccine. You may need this after age 60.  Measles, mumps, and rubella (MMR) vaccine. You may need at least one dose of MMR if you were born in 1957 or later. You may also need a second dose.  Pneumococcal 13-valent conjugate (PCV13) vaccine. One dose is recommended after age 76.  Pneumococcal polysaccharide (PPSV23) vaccine. One dose is recommended after age 76.  Meningococcal vaccine. You may need this if you have certain conditions.  Hepatitis A vaccine. You may need this if you have certain conditions or if you travel or work in places where you   one dose of MMR if you were born in 1957 or later. You may also need a second dose.  Pneumococcal 13-valent conjugate (PCV13) vaccine. One dose is recommended after age 76.  Pneumococcal polysaccharide (PPSV23) vaccine. One dose is recommended after age 76.  Meningococcal vaccine. You may need this if you have certain conditions.  Hepatitis A vaccine. You may need this if you have certain conditions or if you travel or work in places where you may be exposed to hepatitis A.  Hepatitis B vaccine. You may need this if you have certain conditions or if you travel or work in places where you may be exposed to hepatitis B.  Haemophilus influenzae type b (Hib) vaccine. You may need this if you have certain risk  factors.  Talk to your health care provider about which screenings and vaccines you need and how often you need them. This information is not intended to replace advice given to you by your health care provider. Make sure you discuss any questions you have with your health care provider. Document Released: 11/15/2015 Document Revised: 07/08/2016 Document Reviewed: 08/20/2015 Elsevier Interactive Patient Education  2017 [employer].

## 2017-04-01 NOTE — Progress Notes (Signed)
Patient ID: Marvin Mcdonald, male   DOB: 09-09-1941, 76 y.o.   MRN: [id number]   Subjective:  I acted as a Education administrator for Penni Homans, [place], [place]   Patient ID: Marvin Mcdonald, male    DOB: [birth year], 76 y.o.   MRN: [id number]  Chief Complaint  Patient presents with  . Annual Exam  . Diabetes  . Hyperlipidemia  . Hypertension    Diabetes  He presents for his follow-up diabetic visit. He has type 2 diabetes mellitus. Pertinent negatives for hypoglycemia include no headaches. Pertinent negatives for diabetes include no blurred vision and no chest pain. Risk factors for coronary artery disease include hypertension, male sex and diabetes mellitus.  Hyperlipidemia  This is a chronic problem. The problem is controlled. Pertinent negatives include no chest pain or shortness of breath. Risk factors for coronary artery disease include male sex, hypertension and diabetes mellitus.  Hypertension  This is a chronic problem. The problem is controlled. Pertinent negatives include no blurred vision, chest pain, headaches, malaise/fatigue, palpitations or shortness of breath. Risk factors for coronary artery disease include male gender and diabetes mellitus.    Patient is in today for an annual examination. Patient was in need of an interpreter that speaks [place]. Patient has a Hx of Type II Dabetes, HTN, mixed hyperlipidemia. Patient has no acute concerns noted at this time.  He reports eating well and trying to avoid simple carbs. Denies polyuria or polydipsia. Denies CP/palp/SOB/HA/congestion/fevers/GI or GU c/o. Taking meds as prescribed  Patient Care Team: Mosie Lukes, MD as PCP - General (Family Medicine)   Past Medical History:  Diagnosis Date  . Acute URI 01/03/2013  . BPH (benign prostatic hypertrophy)   . Colon polyps   . Dental infection 10/07/2013  . Diabetes mellitus    diet and exercise controlled  . Hematuria 04/07/2013  . Hyperlipidemia   . Low back pain   . Mass of neck  10/07/2013  . Osteoporosis   . Pain in joint, shoulder region 10/07/2013    Past Surgical History:  Procedure Laterality Date  . COLONOSCOPY    . HAND SURGERY    . POLYPECTOMY      Family History  Problem Relation Age of Onset  . Hypertension Sister   . Hypertension Brother   . Colon cancer Neg Hx   . Stomach cancer Neg Hx   . Esophageal cancer Neg Hx     Social History   Social History  . Marital status: Married    Spouse name: Young  . Number of children: N/A  . Years of education: N/A   Occupational History  .  [employer]    Social History Main Topics  . Smoking status: Former Smoker    Packs/day: 0.50    Years: 25.00    Types: Cigarettes    Quit date: 07/27/1996  . Smokeless tobacco: Never Used  . Alcohol use No  . Drug use: No  . Sexual activity: Not on file   Other Topics Concern  . Not on file   Social History Narrative   Supportive daughter    Outpatient Medications Prior to Visit  Medication Sig Dispense Refill  . aspirin 81 MG tablet Take 81 mg by mouth daily.      . Calcium Carbonate (CALCIUM 500 PO) Take by mouth 2 (two) times daily.      . Ginger, Zingiber officinalis, (GINGER ROOT PO) Take by mouth.      . Multiple Vitamin (MULTIVITAMIN) capsule  Take 1 capsule by mouth daily.      . ONE TOUCH ULTRA TEST test strip USE TO TEST BLOOD SUGAR EVERY DAY 100 each 3  . simvastatin (ZOCOR) 40 MG tablet Take 1 tablet (40 mg total) by mouth daily. 90 tablet 1  . vitamin C (ASCORBIC ACID) 500 MG tablet Take 1,000 mg by mouth 2 (two) times daily.     . methocarbamol (ROBAXIN) 500 MG tablet Take 1 tablet (500 mg total) by mouth at bedtime as needed for muscle spasms. (Patient not taking: Reported on 04/01/2017) 30 tablet 0   No facility-administered medications prior to visit.     No Known Allergies  Review of Systems  Constitutional: Negative for fever and malaise/fatigue.  HENT: Negative for congestion.   Eyes: Negative for blurred vision.    Respiratory: Negative for cough and shortness of breath.   Cardiovascular: Negative for chest pain, palpitations and leg swelling.  Gastrointestinal: Negative for vomiting.  Musculoskeletal: Negative for back pain.  Skin: Negative for rash.  Neurological: Negative for loss of consciousness and headaches.       Objective:    Physical Exam  Constitutional: He is oriented to person, place, and time. He appears well-developed and well-nourished. No distress.  HENT:  Head: Normocephalic and atraumatic.  Eyes: Conjunctivae are normal.  Neck: Normal range of motion. No thyromegaly present.  Cardiovascular: Normal rate and regular rhythm.   Pulmonary/Chest: Effort normal and breath sounds normal. He has no wheezes.  Abdominal: Soft. Bowel sounds are normal. There is no tenderness.  Musculoskeletal: He exhibits no edema or deformity.  Neurological: He is alert and oriented to person, place, and time.  Skin: Skin is warm and dry. He is not diaphoretic.  Psychiatric: He has a normal mood and affect.    BP 129/71 (BP Location: Left Arm, Patient Position: Sitting, Cuff Size: Normal)   Pulse 99   Temp 98.5 F (36.9 C) (Oral)   Ht 5\' 7"  (1.702 m)   Wt 156 lb 6.4 oz (70.9 kg)   SpO2 95% Comment: RA  BMI 24.50 kg/m  Wt Readings from Last 3 Encounters:  04/01/17 156 lb 6.4 oz (70.9 kg)  10/13/16 155 lb 6.4 oz (70.5 kg)  09/29/16 154 lb 8 oz (70.1 kg)   BP Readings from Last 3 Encounters:  04/01/17 129/71  10/13/16 134/78  09/29/16 128/70     Immunization History  Administered Date(s) Administered  . Influenza Split 08/12/2016  . Influenza Whole 08/18/2004, 07/23/2009, 07/18/2010  . Pneumococcal Conjugate-13 10/05/2013  . Pneumococcal Polysaccharide-23 07/21/2004, 10/13/2016  . Tdap 05/05/2011  . Zoster 04/02/2006    Health Maintenance  Topic Date Due  . OPHTHALMOLOGY EXAM  11/10/2001  . COLONOSCOPY  08/10/2016  . FOOT EXAM  05/28/2017  . INFLUENZA VACCINE  06/02/2017  .  HEMOGLOBIN A1C  10/01/2017  . URINE MICROALBUMIN  11/16/2017  . TETANUS/TDAP  05/04/2021  . PNA vac Low Risk Adult  Completed    Lab Results  Component Value Date   WBC 5.6 04/01/2017   HGB 14.2 04/01/2017   HCT 41.2 04/01/2017   PLT 172.0 04/01/2017   GLUCOSE 128 (H) 04/01/2017   CHOL 186 04/01/2017   TRIG 189.0 (H) 04/01/2017   HDL 42.40 04/01/2017   LDLDIRECT 79.0 11/16/2016   LDLCALC 106 (H) 04/01/2017   ALT 29 04/01/2017   AST 24 04/01/2017   NA 138 04/01/2017   K 4.0 04/01/2017   CL 106 04/01/2017   CREATININE 0.91 04/01/2017  BUN 14 04/01/2017   CO2 26 04/01/2017   TSH 1.82 04/01/2017   PSA 1.11 08/29/2010   HGBA1C 7.0 (H) 04/01/2017   MICROALBUR 0.9 11/16/2016    Lab Results  Component Value Date   TSH 1.82 04/01/2017   Lab Results  Component Value Date   WBC 5.6 04/01/2017   HGB 14.2 04/01/2017   HCT 41.2 04/01/2017   MCV 99.0 04/01/2017   PLT 172.0 04/01/2017   Lab Results  Component Value Date   NA 138 04/01/2017   K 4.0 04/01/2017   CO2 26 04/01/2017   GLUCOSE 128 (H) 04/01/2017   BUN 14 04/01/2017   CREATININE 0.91 04/01/2017   BILITOT 0.9 04/01/2017   ALKPHOS 78 04/01/2017   AST 24 04/01/2017   ALT 29 04/01/2017   PROT 7.6 04/01/2017   ALBUMIN 4.5 04/01/2017   CALCIUM 9.4 04/01/2017   GFR 86.07 04/01/2017   Lab Results  Component Value Date   CHOL 186 04/01/2017   Lab Results  Component Value Date   HDL 42.40 04/01/2017   Lab Results  Component Value Date   LDLCALC 106 (H) 04/01/2017   Lab Results  Component Value Date   TRIG 189.0 (H) 04/01/2017   Lab Results  Component Value Date   CHOLHDL 4 04/01/2017   Lab Results  Component Value Date   HGBA1C 7.0 (H) 04/01/2017         Assessment & Plan:   Problem List Items Addressed This Visit    Mixed hyperlipidemia   Relevant Orders   Lipid panel (Completed)   Essential hypertension    Well controlled, no changes to meds. Encouraged heart healthy diet such as the  DASH diet and exercise as tolerated.       Relevant Orders   CBC (Completed)   Comprehensive metabolic panel (Completed)   TSH (Completed)   Osteoporosis    Encouraged to get adequate exercise, calcium and vitamin d intake      Relevant Orders   Vitamin D (25 hydroxy) (Completed)   Diabetes type 2, controlled (HCC)    hgba1c acceptable, minimize simple carbs. Increase exercise as tolerated. Continue current meds      Relevant Orders   Hemoglobin A1c (Completed)   Ambulatory referral to Ophthalmology   Hx of colonic polyp    Last colonoscopy in 2012 had 4 polyps.repeat colonoscopy recommended in 5 years. No bowel concerns at this time. He is returning home to [place] soon not to return until April of 2019 but  Agrees to proceed with colonoscopy when he returns.      Preventative health care    Patient encouraged to maintain heart healthy diet, regular exercise, adequate sleep. Consider daily probiotics. Take medications as prescribed      Colon cancer screening - Primary   Relevant Orders   Ambulatory referral to Gastroenterology   Dry eyes    Describes sticky discharge and relief with warm compresses. With history of diabetes will refer to an opthammology      Relevant Orders   Ambulatory referral to Ophthalmology      I have discontinued Mr. Aracena's methocarbamol. I am also having him maintain his aspirin, Calcium Carbonate (CALCIUM 500 PO), multivitamin, vitamin C, (Ginger, Zingiber officinalis, (GINGER ROOT PO)), ONE TOUCH ULTRA TEST, and simvastatin.  No orders of the defined types were placed in this encounter.   CMA served as Education administrator during this visit. History, Physical and Plan performed by medical provider. Documentation and orders reviewed and attested  to.  Penni Homans, MD

## 2017-04-01 NOTE — Assessment & Plan Note (Signed)
Well controlled, no changes to meds. Encouraged heart healthy diet such as the DASH diet and exercise as tolerated.  °

## 2017-04-01 NOTE — Assessment & Plan Note (Signed)
Patient encouraged to maintain heart healthy diet, regular exercise, adequate sleep. Consider daily probiotics. Take medications as prescribed 

## 2017-04-05 ENCOUNTER — Other Ambulatory Visit: Payer: Self-pay | Admitting: Family Medicine

## 2017-04-13 IMAGING — CT CT CHEST W/ CM
2 of 3 series · 15 of 36 positions shown, 18 images · IV contrast (APPLIED)
Comparison: Chest radiograph of May 21, 2015.

CLINICAL DATA: Cough for 3 months.

EXAM:
CT CHEST WITH CONTRAST
TECHNIQUE: Multidetector CT imaging of the chest was performed during
intravenous contrast administration.
CONTRAST:  75mL OMNIPAQUE IOHEXOL 300 MG/ML  SOLN

[Series 2: chest 5.0 b31f · axial · 0.66mm/px · z∈[-384,-160]mm · 12 of 53 slices shown, 15 images]
[im 4/53  mediastinal]
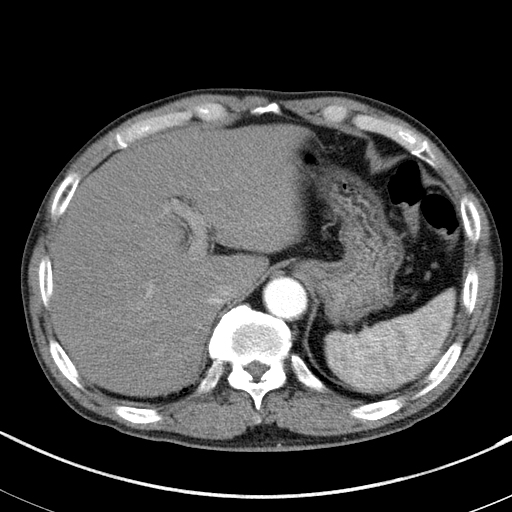
[im 4/53  lung]
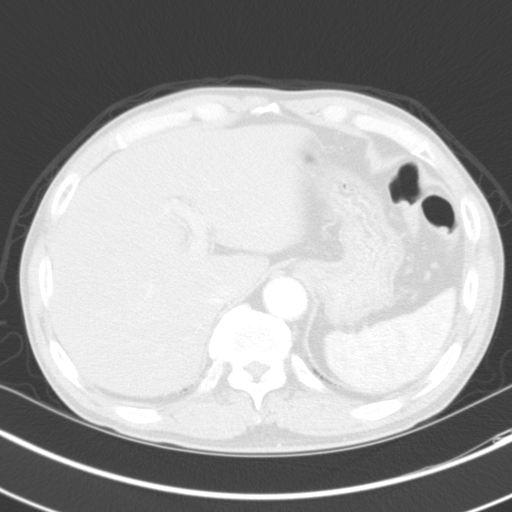
[im 8/53  lung]
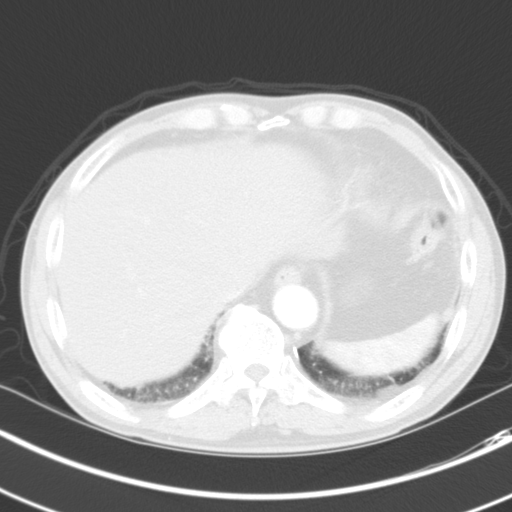
[im 12/53  lung]
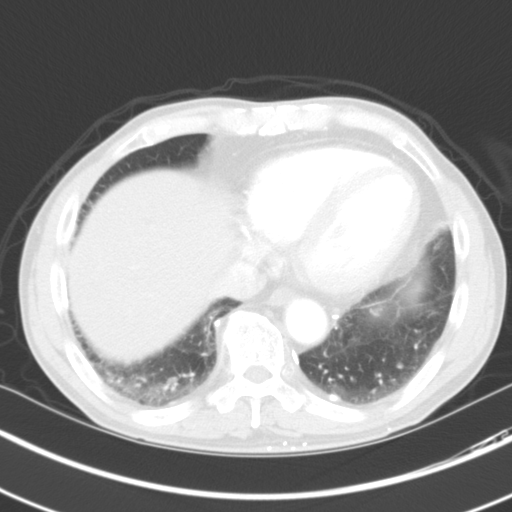
[im 16/53  lung]
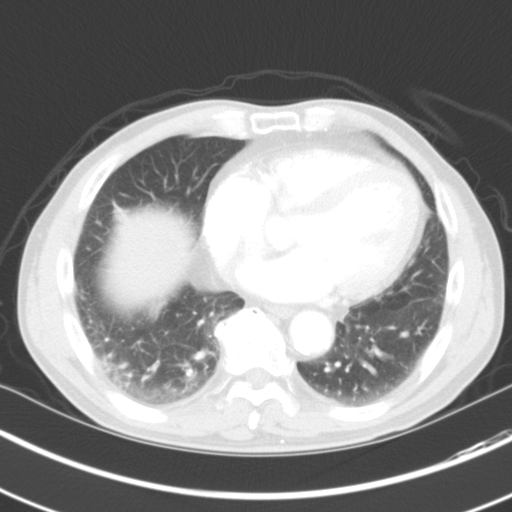
[im 20/53  mediastinal]
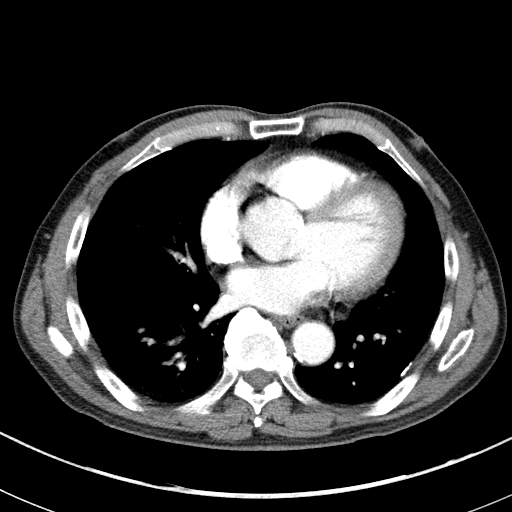
[im 20/53  lung]
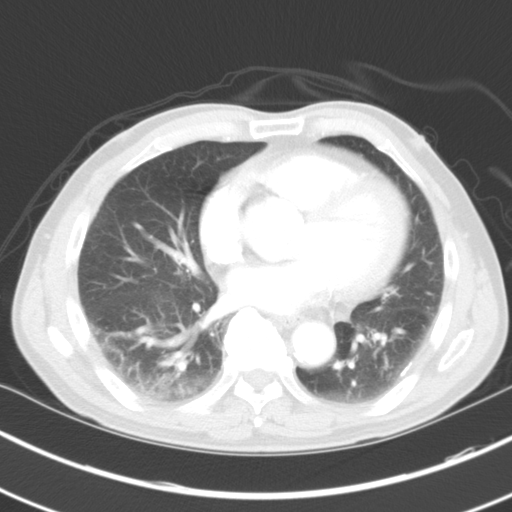
[im 24/53  lung]
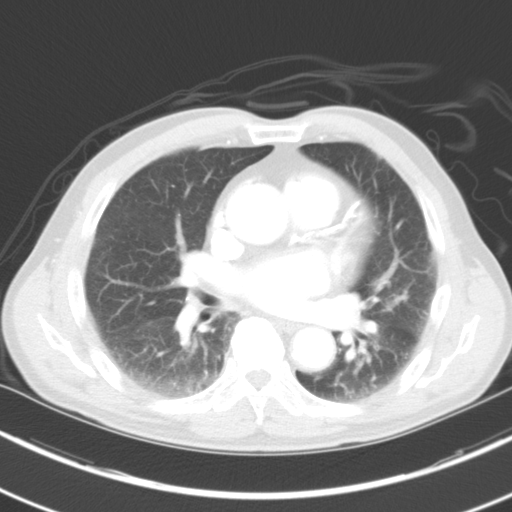
[im 29/53  lung]
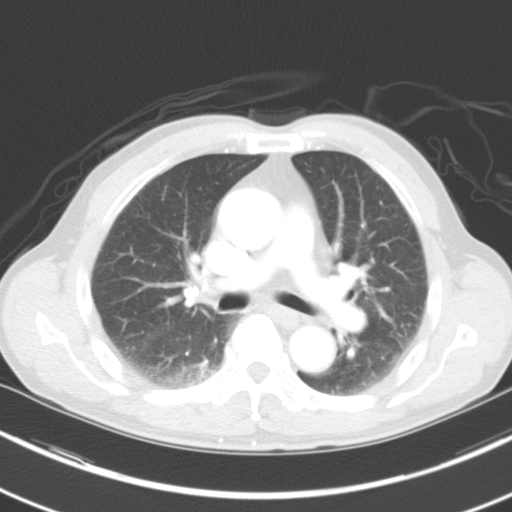
[im 33/53  lung]
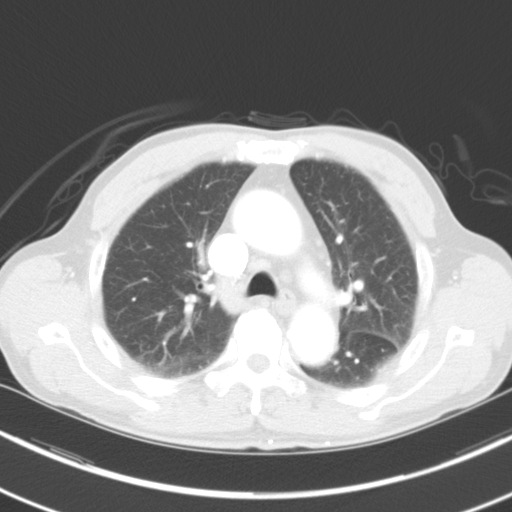
[im 37/53  mediastinal]
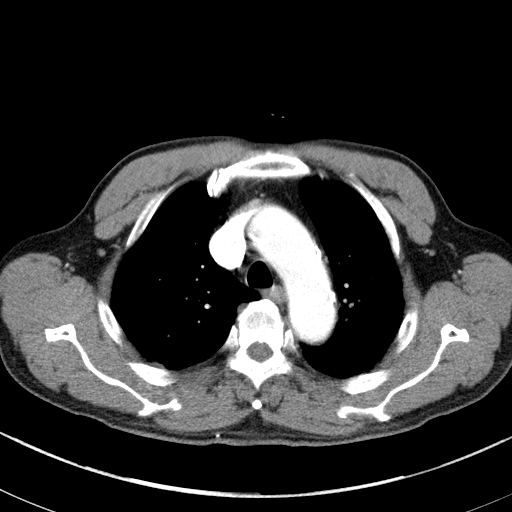
[im 37/53  lung]
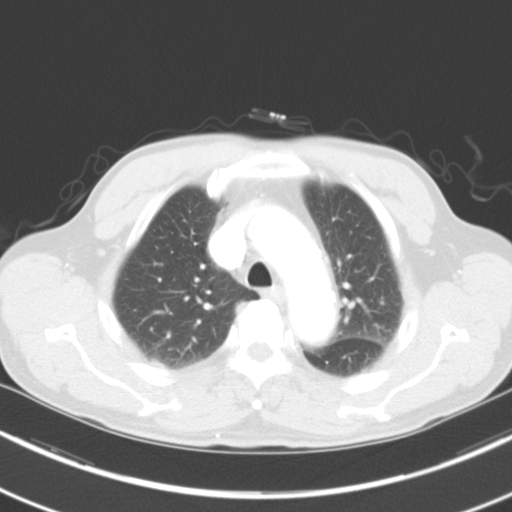
[im 41/53  lung]
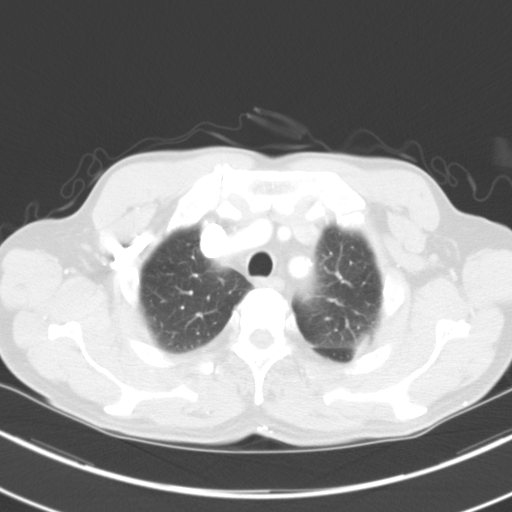
[im 45/53  lung]
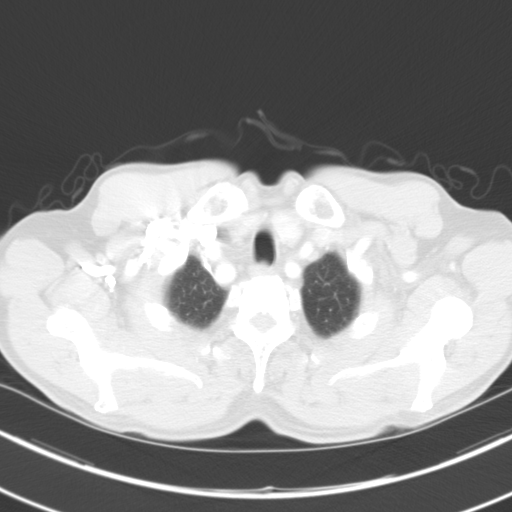
[im 49/53  lung]
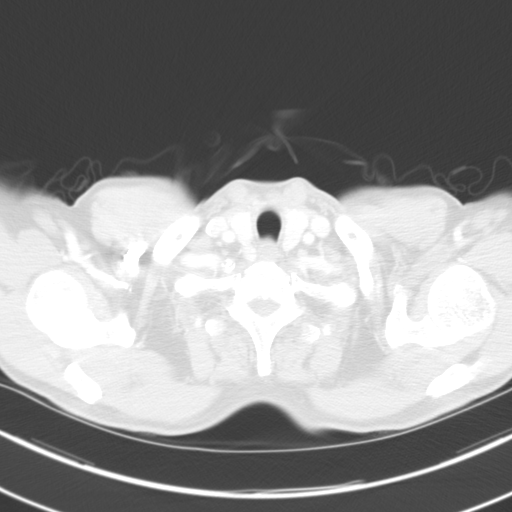

[Series 6: chest 3.0 coronal · coronal · 0.53mm/px · 3 of 80 slices shown]
[im 16/80  lung]
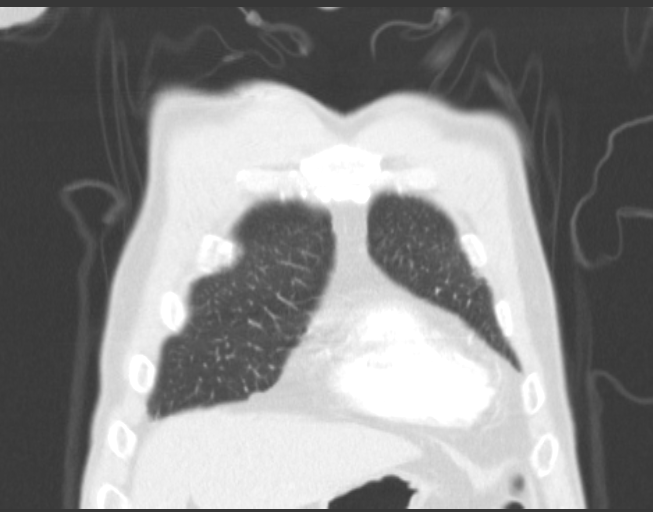
[im 32/80  lung]
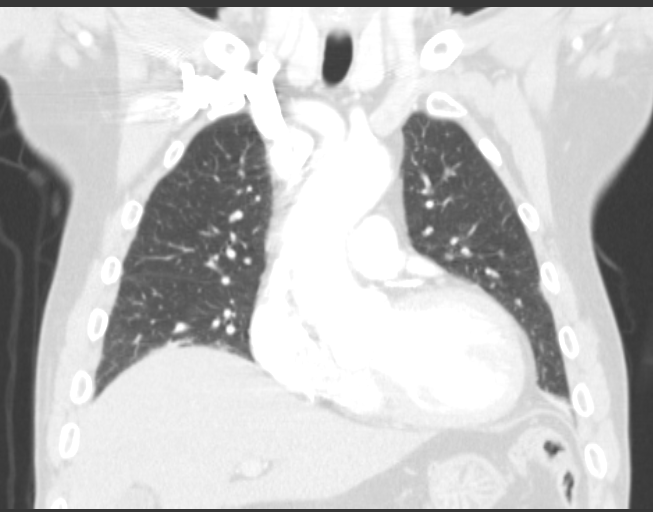
[im 48/80  lung]
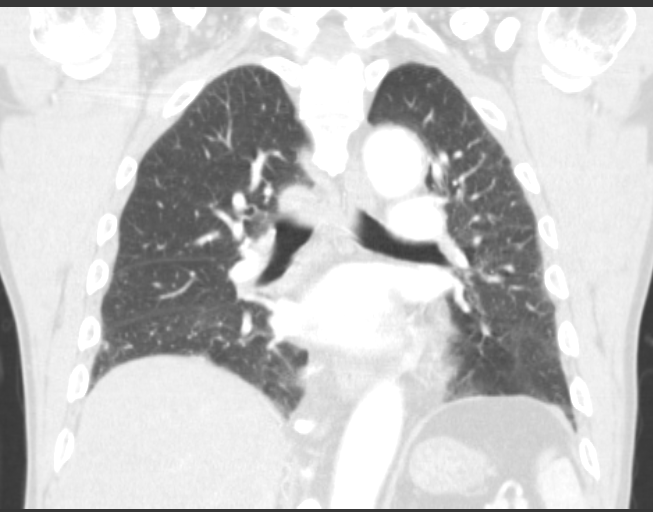

[15 of 36 positions shown; findings below may reference images not displayed]

FINDINGS: No pneumothorax or pleural effusion is noted. Small calcified
pleural plaques are noted bilaterally. There is no evidence of
thoracic aortic dissection or aneurysm. Atherosclerosis of thoracic
aorta is noted. Coronary artery calcifications are noted. Visualized
portions of pulmonary arteries appear normal. Visualized portion of
upper abdomen appears normal. No mediastinal mass or adenopathy is
noted. Old left clavicular fracture is noted.
IMPRESSION: Small calcified pleural plaques are noted bilaterally suggesting
asbestos exposure.

Coronary artery calcifications are noted suggesting coronary artery
disease.

No acute cardiopulmonary abnormality seen.

## 2017-07-18 ENCOUNTER — Encounter: Payer: Self-pay | Admitting: *Deleted

## 2017-07-18 DIAGNOSIS — Z23 Encounter for immunization: Secondary | ICD-10-CM

## 2018-04-13 ENCOUNTER — Telehealth: Payer: Self-pay | Admitting: Family Medicine

## 2018-04-13 NOTE — Telephone Encounter (Signed)
Pt had to have appt rescheduled but needs orders for labs on 05/26/18. He already had the lab appt but I didn't see any orders.

## 2018-04-14 ENCOUNTER — Other Ambulatory Visit: Payer: Self-pay

## 2018-04-14 DIAGNOSIS — M810 Age-related osteoporosis without current pathological fracture: Secondary | ICD-10-CM

## 2018-04-14 DIAGNOSIS — I10 Essential (primary) hypertension: Secondary | ICD-10-CM

## 2018-04-14 DIAGNOSIS — E119 Type 2 diabetes mellitus without complications: Secondary | ICD-10-CM

## 2018-04-14 DIAGNOSIS — E782 Mixed hyperlipidemia: Secondary | ICD-10-CM

## 2018-04-14 NOTE — Telephone Encounter (Signed)
Future lab orders placed

## 2018-04-27 ENCOUNTER — Other Ambulatory Visit: Payer: Self-pay

## 2018-04-27 ENCOUNTER — Other Ambulatory Visit: Payer: Self-pay | Admitting: Family Medicine

## 2018-04-27 MED ORDER — SIMVASTATIN 40 MG PO TABS: 40.0000 mg | ORAL_TABLET | Freq: Every day | ORAL | 0 refills | Status: DC

## 2018-05-26 ENCOUNTER — Other Ambulatory Visit: Payer: Medicare Other

## 2018-05-26 ENCOUNTER — Other Ambulatory Visit (INDEPENDENT_AMBULATORY_CARE_PROVIDER_SITE_OTHER): Payer: Medicare Other

## 2018-05-26 ENCOUNTER — Encounter: Payer: Medicare Other | Admitting: Family Medicine

## 2018-05-26 DIAGNOSIS — I10 Essential (primary) hypertension: Secondary | ICD-10-CM

## 2018-05-26 DIAGNOSIS — E119 Type 2 diabetes mellitus without complications: Secondary | ICD-10-CM

## 2018-05-26 DIAGNOSIS — M810 Age-related osteoporosis without current pathological fracture: Secondary | ICD-10-CM | POA: Diagnosis not present

## 2018-05-26 DIAGNOSIS — E782 Mixed hyperlipidemia: Secondary | ICD-10-CM

## 2018-05-26 LAB — COMPREHENSIVE METABOLIC PANEL
ALK PHOS: 77 U/L (ref 39–117)
ALT: 26 U/L (ref 0–53)
AST: 19 U/L (ref 0–37)
Albumin: 4.3 g/dL (ref 3.5–5.2)
BUN: 13 mg/dL (ref 6–23)
CHLORIDE: 105 meq/L (ref 96–112)
CO2: 25 meq/L (ref 19–32)
Calcium: 9.3 mg/dL (ref 8.4–10.5)
Creatinine, Ser: 0.95 mg/dL (ref 0.40–1.50)
GFR: 81.66 mL/min (ref >60.00)
GLUCOSE: 132 mg/dL — AB (ref 70–99)
POTASSIUM: 3.9 meq/L (ref 3.5–5.1)
SODIUM: 138 meq/L (ref 135–145)
TOTAL PROTEIN: 7.8 g/dL (ref 6.0–8.3)
Total Bilirubin: 0.6 mg/dL (ref 0.2–1.2)

## 2018-05-26 LAB — CBC
HEMATOCRIT: 40 % (ref 39.0–52.0)
HEMOGLOBIN: 14 g/dL (ref 13.0–17.0)
MCHC: 35 g/dL (ref 30.0–36.0)
MCV: 99.1 fl (ref 78.0–100.0)
Platelets: 183 10*3/uL (ref 150.0–400.0)
RBC: 4.03 Mil/uL — ABNORMAL LOW (ref 4.22–5.81)
RDW: 12.7 % (ref 11.5–15.5)
WBC: 6 10*3/uL (ref 4.0–10.5)

## 2018-05-26 LAB — LIPID PANEL
CHOL/HDL RATIO: 4
Cholesterol: 156 mg/dL (ref 0–200)
HDL: 39.2 mg/dL (ref >39.00)
LDL CALC: 88 mg/dL (ref 0–99)
NONHDL: 116.64
Triglycerides: 145 mg/dL (ref 0.0–149.0)
VLDL: 29 mg/dL (ref 0.0–40.0)

## 2018-05-26 LAB — HEMOGLOBIN A1C: Hgb A1c MFr Bld: 6.7 % — ABNORMAL HIGH (ref 4.6–6.5)

## 2018-05-26 LAB — TSH: TSH: 2.26 u[IU]/mL (ref 0.35–4.50)

## 2018-05-26 LAB — VITAMIN D 25 HYDROXY (VIT D DEFICIENCY, FRACTURES): VITD: 66.68 ng/mL (ref 30.00–100.00)

## 2018-06-16 ENCOUNTER — Encounter: Payer: Self-pay | Admitting: Family Medicine

## 2018-06-16 ENCOUNTER — Ambulatory Visit (INDEPENDENT_AMBULATORY_CARE_PROVIDER_SITE_OTHER): Payer: Medicare Other | Admitting: Family Medicine

## 2018-06-16 VITALS — BP 120/70 | HR 70 | Temp 97.6°F | Resp 18 | Wt 150.2 lb

## 2018-06-16 DIAGNOSIS — M810 Age-related osteoporosis without current pathological fracture: Secondary | ICD-10-CM

## 2018-06-16 DIAGNOSIS — Z86010 Personal history of colonic polyps: Secondary | ICD-10-CM

## 2018-06-16 DIAGNOSIS — N400 Enlarged prostate without lower urinary tract symptoms: Secondary | ICD-10-CM

## 2018-06-16 DIAGNOSIS — K635 Polyp of colon: Secondary | ICD-10-CM | POA: Insufficient documentation

## 2018-06-16 DIAGNOSIS — Z0000 Encounter for general adult medical examination without abnormal findings: Secondary | ICD-10-CM | POA: Diagnosis not present

## 2018-06-16 DIAGNOSIS — E119 Type 2 diabetes mellitus without complications: Secondary | ICD-10-CM

## 2018-06-16 DIAGNOSIS — Z87438 Personal history of other diseases of male genital organs: Secondary | ICD-10-CM

## 2018-06-16 DIAGNOSIS — E782 Mixed hyperlipidemia: Secondary | ICD-10-CM

## 2018-06-16 DIAGNOSIS — I10 Essential (primary) hypertension: Secondary | ICD-10-CM

## 2018-06-16 NOTE — Assessment & Plan Note (Signed)
5 polyps at last colonoscopy they recommend repeat in 5 years but he declines a repeat now but will consider at next visit.

## 2018-06-16 NOTE — Assessment & Plan Note (Signed)
Patient encouraged to maintain heart healthy diet, regular exercise, adequate sleep. Consider daily probiotics. Take medications as prescribed. Labs reviewed with patient and it is recommended he take the Shingrix shot.

## 2018-06-16 NOTE — Patient Instructions (Signed)
Shingrix is the new shingles shot, 2 shots over 2-6 months, at pharmacy.   Preventive Care 19 Years and Older, Male Preventive care refers to lifestyle choices and visits with your health care provider that can promote health and wellness. What does preventive care include?  A yearly physical exam. This is also called an annual well check.  Dental exams once or twice a year.  Routine eye exams. Ask your health care provider how often you should have your eyes checked.  Personal lifestyle choices, including: ? Daily care of your teeth and gums. ? Regular physical activity. ? Eating a healthy diet. ? Avoiding tobacco and drug use. ? Limiting alcohol use. ? Practicing safe sex. ? Taking low doses of aspirin every day. ? Taking vitamin and mineral supplements as recommended by your health care provider. What happens during an annual well check? The services and screenings done by your health care provider during your annual well check will depend on your age, overall health, lifestyle risk factors, and family history of disease. Counseling Your health care provider may ask you questions about your:  Alcohol use.  Tobacco use.  Drug use.  Emotional well-being.  Home and relationship well-being.  Sexual activity.  Eating habits.  History of falls.  Memory and ability to understand (cognition).  Work and work Statistician.  Screening You may have the following tests or measurements:  Height, weight, and BMI.  Blood pressure.  Lipid and cholesterol levels. These may be checked every 5 years, or more frequently if you are over 33 years old.  Skin check.  Lung cancer screening. You may have this screening every year starting at age 63 if you have a 30-pack-year history of smoking and currently smoke or have quit within the past 15 years.  Fecal occult blood test (FOBT) of the stool. You may have this test every year starting at age 3.  Flexible sigmoidoscopy or  colonoscopy. You may have a sigmoidoscopy every 5 years or a colonoscopy every 10 years starting at age 62.  Prostate cancer screening. Recommendations will vary depending on your family history and other risks.  Hepatitis C blood test.  Hepatitis B blood test.  Sexually transmitted disease (STD) testing.  Diabetes screening. This is done by checking your blood sugar (glucose) after you have not eaten for a while (fasting). You may have this done every 1-3 years.  Abdominal aortic aneurysm (AAA) screening. You may need this if you are a current or former smoker.  Osteoporosis. You may be screened starting at age 9 if you are at high risk.  Talk with your health care provider about your test results, treatment options, and if necessary, the need for more tests. Vaccines Your health care provider may recommend certain vaccines, such as:  Influenza vaccine. This is recommended every year.  Tetanus, diphtheria, and acellular pertussis (Tdap, Td) vaccine. You may need a Td booster every 10 years.  Varicella vaccine. You may need this if you have not been vaccinated.  Zoster vaccine. You may need this after age 49.  Measles, mumps, and rubella (MMR) vaccine. You may need at least one dose of MMR if you were born in 1957 or later. You may also need a second dose.  Pneumococcal 13-valent conjugate (PCV13) vaccine. One dose is recommended after age 24.  Pneumococcal polysaccharide (PPSV23) vaccine. One dose is recommended after age 47.  Meningococcal vaccine. You may need this if you have certain conditions.  Hepatitis A vaccine. You may need this  if you have certain conditions or if you travel or work in places where you may be exposed to hepatitis A.  Hepatitis B vaccine. You may need this if you have certain conditions or if you travel or work in places where you may be exposed to hepatitis B.  Haemophilus influenzae type b (Hib) vaccine. You may need this if you have certain risk  factors.  Talk to your health care provider about which screenings and vaccines you need and how often you need them. This information is not intended to replace advice given to you by your health care provider. Make sure you discuss any questions you have with your health care provider. Document Released: 11/15/2015 Document Revised: 07/08/2016 Document Reviewed: 08/20/2015 Elsevier Interactive Patient Education  [employer].

## 2018-06-16 NOTE — Assessment & Plan Note (Signed)
Tolerating statin, encouraged heart healthy diet, avoid trans fats, minimize simple carbs and saturated fats. Increase exercise as tolerated 

## 2018-06-16 NOTE — Assessment & Plan Note (Signed)
Asymptomatic will check PSA with next blood draw

## 2018-06-16 NOTE — Assessment & Plan Note (Signed)
hgba1c acceptable, minimize simple carbs. Increase exercise as tolerated. He declines a glucometer to check his sugar.

## 2018-06-19 NOTE — Assessment & Plan Note (Signed)
He is offered a Dexa scan to investigate and declines for now will consider and may order at next visit.

## 2018-06-19 NOTE — Progress Notes (Signed)
Subjective:    Patient ID: Marvin Mcdonald, male    DOB: 05-31-1941, 77 y.o.   MRN: [id number]  No chief complaint on file.   HPI Patient is in today for annual preventative exam and follow up on chronic medical concerns such as diabetes, hyperlipidemia, and hypertension. He is here today alone and while he understands a good amount of English an interpretor is utilized for the visit. He denies any recent febrile illness or hospitalizations. No polyuria or polydipsia is endorsed. He stays busy and tries to maintain a heart healthy diet. He is doing well with activities of daily living. Denies CP/palp/SOB/HA/congestion/fevers/GI or GU c/o. Taking meds as prescribed   Past Medical History:  Diagnosis Date  . Acute URI 01/03/2013  . BPH (benign prostatic hypertrophy)   . Colon cancer screening 04/01/2017  . Colon polyps   . Dental infection 10/07/2013  . Diabetes mellitus    diet and exercise controlled  . Hematuria 04/07/2013  . Hyperlipidemia   . Low back pain   . Mass of neck 10/07/2013  . Osteoporosis   . Pain in joint, shoulder region 10/07/2013    Past Surgical History:  Procedure Laterality Date  . COLONOSCOPY    . HAND SURGERY    . POLYPECTOMY      Family History  Problem Relation Age of Onset  . Hypertension Sister   . Hypertension Brother   . Depression Paternal Grandmother   . Hypertension Brother   . Hypertension Brother   . Colon cancer Neg Hx   . Stomach cancer Neg Hx   . Esophageal cancer Neg Hx     Social History   Socioeconomic History  . Marital status: Married    Spouse name: Young  . Number of children: Not on file  . Years of education: Not on file  . Highest education level: Not on file  Occupational History    Employer: [employer]  . Financial resource strain: Not on file  . Food insecurity:    Worry: Not on file    Inability: Not on file  . Transportation needs:    Medical: Not on file    Non-medical: Not on file    Tobacco Use  . Smoking status: Former Smoker    Packs/day: 0.50    Years: 25.00    Pack years: 12.50    Types: Cigarettes    Last attempt to quit: 07/27/1996    Years since quitting: 21.9  . Smokeless tobacco: Never Used  Substance and Sexual Activity  . Alcohol use: No    Alcohol/week: 0.0 standard drinks  . Drug use: No  . Sexual activity: Not on file  Lifestyle  . Physical activity:    Days per week: Not on file    Minutes per session: Not on file  . Stress: Not on file  Relationships  . Social connections:    Talks on phone: Not on file    Gets together: Not on file    Attends religious service: Not on file    Active member of club or organization: Not on file    Attends meetings of clubs or organizations: Not on file    Relationship status: Not on file  . Intimate partner violence:    Fear of current or ex partner: Not on file    Emotionally abused: Not on file    Physically abused: Not on file    Forced sexual activity: Not on file  Other  Topics Concern  . Not on file  Social History Narrative   Supportive daughter    Outpatient Medications Prior to Visit  Medication Sig Dispense Refill  . aspirin 81 MG tablet Take 81 mg by mouth daily.      . Calcium Carbonate (CALCIUM 500 PO) Take by mouth 2 (two) times daily.      . Ginger, Zingiber officinalis, (GINGER ROOT PO) Take by mouth.      . Multiple Vitamin (MULTIVITAMIN) capsule Take 1 capsule by mouth daily.      . ONE TOUCH ULTRA TEST test strip USE TO TEST BLOOD SUGAR EVERY DAY 100 each 3  . simvastatin (ZOCOR) 40 MG tablet TAKE 1 TABLET(40 MG) BY MOUTH DAILY 30 tablet 0  . vitamin C (ASCORBIC ACID) 500 MG tablet Take 1,000 mg by mouth 2 (two) times daily.     . simvastatin (ZOCOR) 40 MG tablet TAKE 1 TABLET(40 MG) BY MOUTH DAILY. NEED FOLLOW UP APPOINTMENT 90 tablet 0   No facility-administered medications prior to visit.     No Known Allergies  Review of Systems  Constitutional: Negative for fever and  malaise/fatigue.  HENT: Negative for congestion.   Eyes: Negative for blurred vision.  Respiratory: Negative for shortness of breath.   Cardiovascular: Negative for chest pain, palpitations and leg swelling.  Gastrointestinal: Negative for abdominal pain, blood in stool and nausea.  Genitourinary: Negative for dysuria and frequency.  Musculoskeletal: Negative for falls.  Skin: Negative for rash.  Neurological: Negative for dizziness, loss of consciousness and headaches.  Endo/Heme/Allergies: Negative for environmental allergies.  Psychiatric/Behavioral: Negative for depression. The patient is not nervous/anxious.        Objective:    Physical Exam  Constitutional: He is oriented to person, place, and time. He appears well-developed and well-nourished. No distress.  HENT:  Head: Normocephalic and atraumatic.  Nose: Nose normal.  Eyes: Right eye exhibits no discharge. Left eye exhibits no discharge.  Neck: Normal range of motion. Neck supple.  Cardiovascular: Normal rate and regular rhythm. Exam reveals no friction rub.  No murmur heard. Pulmonary/Chest: Effort normal and breath sounds normal.  Abdominal: Soft. Bowel sounds are normal. There is no tenderness.  Musculoskeletal: He exhibits no edema.  Neurological: He is alert and oriented to person, place, and time.  Skin: Skin is warm and dry.  Psychiatric: He has a normal mood and affect.  Nursing note and vitals reviewed.   BP 120/70 (BP Location: Left Arm, Patient Position: Sitting, Cuff Size: Normal)   Pulse 70   Temp 97.6 F (36.4 C) (Oral)   Resp 18   Wt 150 lb 3.2 oz (68.1 kg)   SpO2 96%   BMI 23.52 kg/m  Wt Readings from Last 3 Encounters:  06/16/18 150 lb 3.2 oz (68.1 kg)  04/01/17 156 lb 6.4 oz (70.9 kg)  10/13/16 155 lb 6.4 oz (70.5 kg)     Lab Results  Component Value Date   WBC 6.0 05/26/2018   HGB 14.0 05/26/2018   HCT 40.0 05/26/2018   PLT 183.0 05/26/2018   GLUCOSE 132 (H) 05/26/2018   CHOL 156  05/26/2018   TRIG 145.0 05/26/2018   HDL 39.20 05/26/2018   LDLDIRECT 79.0 11/16/2016   LDLCALC 88 05/26/2018   ALT 26 05/26/2018   AST 19 05/26/2018   NA 138 05/26/2018   K 3.9 05/26/2018   CL 105 05/26/2018   CREATININE 0.95 05/26/2018   BUN 13 05/26/2018   CO2 25 05/26/2018   TSH  2.26 05/26/2018   PSA 1.11 08/29/2010   HGBA1C 6.7 (H) 05/26/2018   MICROALBUR 0.9 11/16/2016    Lab Results  Component Value Date   TSH 2.26 05/26/2018   Lab Results  Component Value Date   WBC 6.0 05/26/2018   HGB 14.0 05/26/2018   HCT 40.0 05/26/2018   MCV 99.1 05/26/2018   PLT 183.0 05/26/2018   Lab Results  Component Value Date   NA 138 05/26/2018   K 3.9 05/26/2018   CO2 25 05/26/2018   GLUCOSE 132 (H) 05/26/2018   BUN 13 05/26/2018   CREATININE 0.95 05/26/2018   BILITOT 0.6 05/26/2018   ALKPHOS 77 05/26/2018   AST 19 05/26/2018   ALT 26 05/26/2018   PROT 7.8 05/26/2018   ALBUMIN 4.3 05/26/2018   CALCIUM 9.3 05/26/2018   GFR 81.66 05/26/2018   Lab Results  Component Value Date   CHOL 156 05/26/2018   Lab Results  Component Value Date   HDL 39.20 05/26/2018   Lab Results  Component Value Date   LDLCALC 88 05/26/2018   Lab Results  Component Value Date   TRIG 145.0 05/26/2018   Lab Results  Component Value Date   CHOLHDL 4 05/26/2018   Lab Results  Component Value Date   HGBA1C 6.7 (H) 05/26/2018       Assessment & Plan:   Problem List Items Addressed This Visit    Mixed hyperlipidemia    Tolerating statin, encouraged heart healthy diet, avoid trans fats, minimize simple carbs and saturated fats. Increase exercise as tolerated      Relevant Orders   Lipid panel   Essential hypertension    Well controlled, no changes to meds. Encouraged heart healthy diet such as the DASH diet and exercise as tolerated.       History of BPH    Asymptomatic will check PSA with next blood draw      Osteoporosis    He is offered a Dexa scan to investigate and  declines for now will consider and may order at next visit.       Diabetes type 2, controlled ([place])    hgba1c acceptable, minimize simple carbs. Increase exercise as tolerated. He declines a glucometer to check his sugar.       Relevant Orders   Hemoglobin A1c   Microalbumin / creatinine urine ratio   Hx of colonic polyp    He is not having any bowel concerns. He is due for follow up colonoscopy but declines referral. Discussion is had through a translator to confirm his understanding of need for repeat colonoscopy. He will consider referral at next visit.       Preventative health care    Patient encouraged to maintain heart healthy diet, regular exercise, adequate sleep. Consider daily probiotics. Take medications as prescribed. Labs reviewed with patient and it is recommended he take the Shingrix shot.       Relevant Orders   CBC   Comprehensive metabolic panel   TSH   Colon polyps    5 polyps at last colonoscopy they recommend repeat in 5 years but he declines a repeat now but will consider at next visit.        Other Visit Diagnoses    Benign prostatic hyperplasia without lower urinary tract symptoms    -  Primary   Relevant Orders   PSA      I am having Bertell Maria. Silvers maintain his aspirin, Calcium Carbonate (CALCIUM 500 PO), multivitamin, vitamin C, (  Ginger, Zingiber officinalis, (GINGER ROOT PO)), ONE TOUCH ULTRA TEST, and simvastatin.  No orders of the defined types were placed in this encounter.    Penni Homans, MD

## 2018-06-19 NOTE — Assessment & Plan Note (Signed)
He is not having any bowel concerns. He is due for follow up colonoscopy but declines referral. Discussion is had through a translator to confirm his understanding of need for repeat colonoscopy. He will consider referral at next visit.

## 2018-06-19 NOTE — Assessment & Plan Note (Signed)
Well controlled, no changes to meds. Encouraged heart healthy diet such as the DASH diet and exercise as tolerated.  °

## 2018-07-23 ENCOUNTER — Other Ambulatory Visit: Payer: Self-pay | Admitting: Family Medicine

## 2018-08-09 ENCOUNTER — Encounter: Payer: Self-pay | Admitting: *Deleted

## 2018-11-27 ENCOUNTER — Other Ambulatory Visit: Payer: Self-pay | Admitting: Family Medicine

## 2018-12-14 ENCOUNTER — Other Ambulatory Visit: Payer: Medicare Other

## 2018-12-19 ENCOUNTER — Ambulatory Visit: Payer: Medicare Other | Admitting: Family Medicine

## 2018-12-21 ENCOUNTER — Other Ambulatory Visit (INDEPENDENT_AMBULATORY_CARE_PROVIDER_SITE_OTHER): Payer: Medicare Other

## 2018-12-21 ENCOUNTER — Other Ambulatory Visit: Payer: Medicare Other

## 2018-12-21 DIAGNOSIS — E782 Mixed hyperlipidemia: Secondary | ICD-10-CM | POA: Diagnosis not present

## 2018-12-21 DIAGNOSIS — N400 Enlarged prostate without lower urinary tract symptoms: Secondary | ICD-10-CM | POA: Diagnosis not present

## 2018-12-21 DIAGNOSIS — Z0000 Encounter for general adult medical examination without abnormal findings: Secondary | ICD-10-CM | POA: Diagnosis not present

## 2018-12-21 DIAGNOSIS — E119 Type 2 diabetes mellitus without complications: Secondary | ICD-10-CM | POA: Diagnosis not present

## 2018-12-21 LAB — COMPREHENSIVE METABOLIC PANEL
ALT: 28 U/L (ref 0–53)
AST: 24 U/L (ref 0–37)
Albumin: 4.5 g/dL (ref 3.5–5.2)
Alkaline Phosphatase: 72 U/L (ref 39–117)
BUN: 12 mg/dL (ref 6–23)
CO2: 27 mEq/L (ref 19–32)
Calcium: 9.3 mg/dL (ref 8.4–10.5)
Chloride: 104 mEq/L (ref 96–112)
Creatinine, Ser: 0.89 mg/dL (ref 0.40–1.50)
GFR: 82.71 mL/min (ref >60.00)
Glucose, Bld: 114 mg/dL — ABNORMAL HIGH (ref 70–99)
Potassium: 4.1 mEq/L (ref 3.5–5.1)
Sodium: 140 mEq/L (ref 135–145)
TOTAL PROTEIN: 7.4 g/dL (ref 6.0–8.3)
Total Bilirubin: 0.9 mg/dL (ref 0.2–1.2)

## 2018-12-21 LAB — CBC
HCT: 40.3 % (ref 39.0–52.0)
Hemoglobin: 13.9 g/dL (ref 13.0–17.0)
MCHC: 34.5 g/dL (ref 30.0–36.0)
MCV: 99.5 fl (ref 78.0–100.0)
Platelets: 159 10*3/uL (ref 150.0–400.0)
RBC: 4.05 Mil/uL — ABNORMAL LOW (ref 4.22–5.81)
RDW: 12.9 % (ref 11.5–15.5)
WBC: 5.8 10*3/uL (ref 4.0–10.5)

## 2018-12-21 LAB — MICROALBUMIN / CREATININE URINE RATIO
Creatinine,U: 32.3 mg/dL
Microalb Creat Ratio: 2.2 mg/g (ref 0.0–30.0)
Microalb, Ur: <0.7 mg/dL (ref 0.0–1.9)

## 2018-12-21 LAB — HEMOGLOBIN A1C: Hgb A1c MFr Bld: 6.5 % (ref 4.6–6.5)

## 2018-12-21 LAB — LIPID PANEL
Cholesterol: 129 mg/dL (ref 0–200)
HDL: 38.1 mg/dL — ABNORMAL LOW (ref >39.00)
LDL Cholesterol: 65 mg/dL (ref 0–99)
NonHDL: 91.14
Total CHOL/HDL Ratio: 3
Triglycerides: 130 mg/dL (ref 0.0–149.0)
VLDL: 26 mg/dL (ref 0.0–40.0)

## 2018-12-21 LAB — TSH: TSH: 2.05 u[IU]/mL (ref 0.35–4.50)

## 2018-12-21 LAB — PSA: PSA: 2.89 ng/mL (ref 0.10–4.00)

## 2018-12-26 ENCOUNTER — Ambulatory Visit: Payer: Medicare Other | Admitting: Family Medicine

## 2019-01-06 ENCOUNTER — Ambulatory Visit (INDEPENDENT_AMBULATORY_CARE_PROVIDER_SITE_OTHER): Payer: Medicare Other | Admitting: Family Medicine

## 2019-01-06 ENCOUNTER — Ambulatory Visit (HOSPITAL_BASED_OUTPATIENT_CLINIC_OR_DEPARTMENT_OTHER)
Admission: RE | Admit: 2019-01-06 | Discharge: 2019-01-06 | Disposition: A | Discharge status: Home / Self Care | Payer: Medicare Other | Source: Ambulatory Visit | Attending: Family Medicine | Admitting: Family Medicine

## 2019-01-06 VITALS — BP 137/71 | HR 76 | Temp 97.9°F | Resp 18 | Wt 152.6 lb

## 2019-01-06 DIAGNOSIS — E782 Mixed hyperlipidemia: Secondary | ICD-10-CM | POA: Diagnosis not present

## 2019-01-06 DIAGNOSIS — Z87438 Personal history of other diseases of male genital organs: Secondary | ICD-10-CM

## 2019-01-06 DIAGNOSIS — R059 Cough, unspecified: Secondary | ICD-10-CM

## 2019-01-06 DIAGNOSIS — M810 Age-related osteoporosis without current pathological fracture: Secondary | ICD-10-CM

## 2019-01-06 DIAGNOSIS — E119 Type 2 diabetes mellitus without complications: Secondary | ICD-10-CM

## 2019-01-06 DIAGNOSIS — I10 Essential (primary) hypertension: Secondary | ICD-10-CM | POA: Diagnosis not present

## 2019-01-06 DIAGNOSIS — R05 Cough: Secondary | ICD-10-CM

## 2019-01-06 DIAGNOSIS — R7612 Nonspecific reaction to cell mediated immunity measurement of gamma interferon antigen response without active tuberculosis: Secondary | ICD-10-CM

## 2019-01-06 DIAGNOSIS — H9190 Unspecified hearing loss, unspecified ear: Secondary | ICD-10-CM

## 2019-01-06 NOTE — Assessment & Plan Note (Signed)
Normal PSA no symptoms or concerns today

## 2019-01-06 NOTE — Assessment & Plan Note (Signed)
Well controlled, no changes to meds. Encouraged heart healthy diet such as the DASH diet and exercise as tolerated.  °

## 2019-01-06 NOTE — Assessment & Plan Note (Addendum)
Encouraged to get adequate exercise, calcium and vitamin d intake  Patient declines Dexa scan

## 2019-01-06 NOTE — Patient Instructions (Addendum)
Shingrix is the new shingles shot, 2 shots over 2-6 months at pharmacy.  Hypertension Hypertension, commonly called high blood pressure, is when the force of blood pumping through the arteries is too strong. The arteries are the blood vessels that carry blood from the heart throughout the body. Hypertension forces the heart to work harder to pump blood and may cause arteries to become narrow or stiff. Having untreated or uncontrolled hypertension can cause heart attacks, strokes, kidney disease, and other problems. A blood pressure reading consists of a higher number over a lower number. Ideally, your blood pressure should be below 120/80. The first ("top") number is called the systolic pressure. It is a measure of the pressure in your arteries as your heart beats. The second ("bottom") number is called the diastolic pressure. It is a measure of the pressure in your arteries as the heart relaxes. What are the causes? The cause of this condition is not known. What increases the risk? Some risk factors for high blood pressure are under your control. Others are not. Factors you can change  Smoking.  Having type 2 diabetes mellitus, high cholesterol, or both.  Not getting enough exercise or physical activity.  Being overweight.  Having too much fat, sugar, calories, or salt (sodium) in your diet.  Drinking too much alcohol. Factors that are difficult or impossible to change  Having chronic kidney disease.  Having a family history of high blood pressure.  Age. Risk increases with age.  Race. You may be at higher risk if you are African-American.  Gender. Men are at higher risk than women before age 52. After age 93, women are at higher risk than men.  Having obstructive sleep apnea.  Stress. What are the signs or symptoms? Extremely high blood pressure (hypertensive crisis) may cause:  Headache.  Anxiety.  Shortness of breath.  Nosebleed.  Nausea and vomiting.  Severe  chest pain.  Jerky movements you cannot control (seizures). How is this diagnosed? This condition is diagnosed by measuring your blood pressure while you are seated, with your arm resting on a surface. The cuff of the blood pressure monitor will be placed directly against the skin of your upper arm at the level of your heart. It should be measured at least twice using the same arm. Certain conditions can cause a difference in blood pressure between your right and left arms. Certain factors can cause blood pressure readings to be lower or higher than normal (elevated) for a short period of time:  When your blood pressure is higher when you are in a health care provider's office than when you are at home, this is called white coat hypertension. Most people with this condition do not need medicines.  When your blood pressure is higher at home than when you are in a health care provider's office, this is called masked hypertension. Most people with this condition may need medicines to control blood pressure. If you have a high blood pressure reading during one visit or you have normal blood pressure with other risk factors:  You may be asked to return on a different day to have your blood pressure checked again.  You may be asked to monitor your blood pressure at home for 1 week or longer. If you are diagnosed with hypertension, you may have other blood or imaging tests to help your health care provider understand your overall risk for other conditions. How is this treated? This condition is treated by making healthy lifestyle changes, such as  eating healthy foods, exercising more, and reducing your alcohol intake. Your health care provider may prescribe medicine if lifestyle changes are not enough to get your blood pressure under control, and if:  Your systolic blood pressure is above 130.  Your diastolic blood pressure is above 80. Your personal target blood pressure may vary depending on your  medical conditions, your age, and other factors. Follow these instructions at home: Eating and drinking   Eat a diet that is high in fiber and potassium, and low in sodium, added sugar, and fat. An example eating plan is called the DASH (Dietary Approaches to Stop Hypertension) diet. To eat this way: ? Eat plenty of fresh fruits and vegetables. Try to fill half of your plate at each meal with fruits and vegetables. ? Eat whole grains, such as whole wheat pasta, brown rice, or whole grain bread. Fill about one quarter of your plate with whole grains. ? Eat or drink low-fat dairy products, such as skim milk or low-fat yogurt. ? Avoid fatty cuts of meat, processed or cured meats, and poultry with skin. Fill about one quarter of your plate with lean proteins, such as fish, chicken without skin, beans, eggs, and tofu. ? Avoid premade and processed foods. These tend to be higher in sodium, added sugar, and fat.  Reduce your daily sodium intake. Most people with hypertension should eat less than 1,500 mg of sodium a day.  Limit alcohol intake to no more than 1 drink a day for nonpregnant women and 2 drinks a day for men. One drink equals 12 oz of beer, 5 oz of wine, or 1 oz of hard liquor. Lifestyle   Work with your health care provider to maintain a healthy body weight or to lose weight. Ask what an ideal weight is for you.  Get at least 30 minutes of exercise that causes your heart to beat faster (aerobic exercise) most days of the week. Activities may include walking, swimming, or biking.  Include exercise to strengthen your muscles (resistance exercise), such as pilates or lifting weights, as part of your weekly exercise routine. Try to do these types of exercises for 30 minutes at least 3 days a week.  Do not use any products that contain nicotine or tobacco, such as cigarettes and e-cigarettes. If you need help quitting, ask your health care provider.  Monitor your blood pressure at home as  told by your health care provider.  Keep all follow-up visits as told by your health care provider. This is important. Medicines  Take over-the-counter and prescription medicines only as told by your health care provider. Follow directions carefully. Blood pressure medicines must be taken as prescribed.  Do not skip doses of blood pressure medicine. Doing this puts you at risk for problems and can make the medicine less effective.  Ask your health care provider about side effects or reactions to medicines that you should watch for. Contact a health care provider if:  You think you are having a reaction to a medicine you are taking.  You have headaches that keep coming back (recurring).  You feel dizzy.  You have swelling in your ankles.  You have trouble with your vision. Get help right away if:  You develop a severe headache or confusion.  You have unusual weakness or numbness.  You feel faint.  You have severe pain in your chest or abdomen.  You vomit repeatedly.  You have trouble breathing. Summary  Hypertension is when the force of blood  pumping through your arteries is too strong. If this condition is not controlled, it may put you at risk for serious complications.  Your personal target blood pressure may vary depending on your medical conditions, your age, and other factors. For most people, a normal blood pressure is less than 120/80.  Hypertension is treated with lifestyle changes, medicines, or a combination of both. Lifestyle changes include weight loss, eating a healthy, low-sodium diet, exercising more, and limiting alcohol. This information is not intended to replace advice given to you by your health care provider. Make sure you discuss any questions you have with your health care provider. Document Released: 10/19/2005 Document Revised: 09/16/2016 Document Reviewed: 09/16/2016 Elsevier Interactive Patient Education  2019 [employer].

## 2019-01-06 NOTE — Assessment & Plan Note (Signed)
hgba1c acceptable, minimize simple carbs. Increase exercise as tolerated. Continue current meds 

## 2019-01-06 NOTE — Assessment & Plan Note (Signed)
Encouraged heart healthy diet, increase exercise, avoid trans fats, consider a krill oil cap daily 

## 2019-01-08 DIAGNOSIS — H9190 Unspecified hearing loss, unspecified ear: Secondary | ICD-10-CM | POA: Insufficient documentation

## 2019-01-08 NOTE — Progress Notes (Signed)
Subjective:    Patient ID: Marvin Mcdonald, male    DOB: 06/13/1941, 78 y.o.   MRN: [id number]  No chief complaint on file.   HPI Patient is in today for follow up. He is accompanied by his wife and history is obtained through an interpretor due to language barrier. No recent febrile illness or hospitalizaitons. No polyuria or polydipsia. He does not complain of hearing loss but he has trouble hearing during the interview. Denies CP/palp/SOB/HA/congestion/fevers/GI or GU c/o. Taking meds as prescribed  Past Medical History:  Diagnosis Date  . Acute URI 01/03/2013  . BPH (benign prostatic hypertrophy)   . Colon cancer screening 04/01/2017  . Colon polyps   . Dental infection 10/07/2013  . Diabetes mellitus    diet and exercise controlled  . Hematuria 04/07/2013  . Hyperlipidemia   . Low back pain   . Mass of neck 10/07/2013  . Osteoporosis   . Pain in joint, shoulder region 10/07/2013    Past Surgical History:  Procedure Laterality Date  . COLONOSCOPY    . HAND SURGERY    . POLYPECTOMY      Family History  Problem Relation Age of Onset  . Hypertension Sister   . Hypertension Brother   . Depression Paternal Grandmother   . Hypertension Brother   . Hypertension Brother   . Colon cancer Neg Hx   . Stomach cancer Neg Hx   . Esophageal cancer Neg Hx     Social History   Socioeconomic History  . Marital status: Married    Spouse name: Young  . Number of children: Not on file  . Years of education: Not on file  . Highest education level: Not on file  Occupational History    Employer: [employer]  . Financial resource strain: Not on file  . Food insecurity:    Worry: Not on file    Inability: Not on file  . Transportation needs:    Medical: Not on file    Non-medical: Not on file  Tobacco Use  . Smoking status: Former Smoker    Packs/day: 0.50    Years: 25.00    Pack years: 12.50    Types: Cigarettes    Last attempt to quit: 07/27/1996   Years since quitting: 22.4  . Smokeless tobacco: Never Used  Substance and Sexual Activity  . Alcohol use: No    Alcohol/week: 0.0 standard drinks  . Drug use: No  . Sexual activity: Not on file  Lifestyle  . Physical activity:    Days per week: Not on file    Minutes per session: Not on file  . Stress: Not on file  Relationships  . Social connections:    Talks on phone: Not on file    Gets together: Not on file    Attends religious service: Not on file    Active member of club or organization: Not on file    Attends meetings of clubs or organizations: Not on file    Relationship status: Not on file  . Intimate partner violence:    Fear of current or ex partner: Not on file    Emotionally abused: Not on file    Physically abused: Not on file    Forced sexual activity: Not on file  Other Topics Concern  . Not on file  Social History Narrative   Supportive daughter    Outpatient Medications Prior to Visit  Medication Sig Dispense Refill  . aspirin  81 MG tablet Take 81 mg by mouth daily.      . Calcium Carbonate (CALCIUM 500 PO) Take by mouth 2 (two) times daily.      . Ginger, Zingiber officinalis, (GINGER ROOT PO) Take by mouth.      . Multiple Vitamin (MULTIVITAMIN) capsule Take 1 capsule by mouth daily.      . ONE TOUCH ULTRA TEST test strip USE TO TEST BLOOD SUGAR EVERY DAY 100 each 3  . simvastatin (ZOCOR) 40 MG tablet TAKE 1 TABLET(40 MG) BY MOUTH DAILY 90 tablet 1  . vitamin C (ASCORBIC ACID) 500 MG tablet Take 1,000 mg by mouth 2 (two) times daily.      No facility-administered medications prior to visit.     No Known Allergies  Review of Systems  Constitutional: Negative for fever and malaise/fatigue.  HENT: Positive for hearing loss. Negative for congestion.   Eyes: Negative for blurred vision.  Respiratory: Negative for shortness of breath.   Cardiovascular: Negative for chest pain, palpitations and leg swelling.  Gastrointestinal: Negative for abdominal  pain, blood in stool and nausea.  Genitourinary: Negative for dysuria and frequency.  Musculoskeletal: Negative for falls.  Skin: Negative for rash.  Neurological: Negative for dizziness, loss of consciousness and headaches.  Endo/Heme/Allergies: Negative for environmental allergies.  Psychiatric/Behavioral: Negative for depression. The patient is not nervous/anxious.        Objective:    Physical Exam Vitals signs and nursing note reviewed.  Constitutional:      General: He is not in acute distress.    Appearance: He is well-developed.  HENT:     Head: Normocephalic and atraumatic.     Nose: Nose normal.  Eyes:     General:        Right eye: No discharge.        Left eye: No discharge.  Neck:     Musculoskeletal: Normal range of motion and neck supple.  Cardiovascular:     Rate and Rhythm: Normal rate and regular rhythm.     Heart sounds: No murmur.  Pulmonary:     Effort: Pulmonary effort is normal.     Breath sounds: Normal breath sounds.  Abdominal:     General: Bowel sounds are normal.     Palpations: Abdomen is soft.     Tenderness: There is no abdominal tenderness.  Skin:    General: Skin is warm and dry.  Neurological:     Mental Status: He is alert and oriented to person, place, and time.     BP 137/71 (BP Location: Left Arm, Patient Position: Sitting, Cuff Size: Normal)   Pulse 76   Temp 97.9 F (36.6 C) (Oral)   Resp 18   Wt 152 lb 9.6 oz (69.2 kg)   SpO2 98%   BMI 23.90 kg/m  Wt Readings from Last 3 Encounters:  01/06/19 152 lb 9.6 oz (69.2 kg)  06/16/18 150 lb 3.2 oz (68.1 kg)  04/01/17 156 lb 6.4 oz (70.9 kg)     Lab Results  Component Value Date   WBC 5.8 12/21/2018   HGB 13.9 12/21/2018   HCT 40.3 12/21/2018   PLT 159.0 12/21/2018   GLUCOSE 114 (H) 12/21/2018   CHOL 129 12/21/2018   TRIG 130.0 12/21/2018   HDL 38.10 (L) 12/21/2018   LDLDIRECT 79.0 11/16/2016   LDLCALC 65 12/21/2018   ALT 28 12/21/2018   AST 24 12/21/2018   NA  140 12/21/2018   K 4.1 12/21/2018   CL  104 12/21/2018   CREATININE 0.89 12/21/2018   BUN 12 12/21/2018   CO2 27 12/21/2018   TSH 2.05 12/21/2018   PSA 2.89 12/21/2018   HGBA1C 6.5 12/21/2018   MICROALBUR <0.7 12/21/2018    Lab Results  Component Value Date   TSH 2.05 12/21/2018   Lab Results  Component Value Date   WBC 5.8 12/21/2018   HGB 13.9 12/21/2018   HCT 40.3 12/21/2018   MCV 99.5 12/21/2018   PLT 159.0 12/21/2018   Lab Results  Component Value Date   NA 140 12/21/2018   K 4.1 12/21/2018   CO2 27 12/21/2018   GLUCOSE 114 (H) 12/21/2018   BUN 12 12/21/2018   CREATININE 0.89 12/21/2018   BILITOT 0.9 12/21/2018   ALKPHOS 72 12/21/2018   AST 24 12/21/2018   ALT 28 12/21/2018   PROT 7.4 12/21/2018   ALBUMIN 4.5 12/21/2018   CALCIUM 9.3 12/21/2018   GFR 82.71 12/21/2018   Lab Results  Component Value Date   CHOL 129 12/21/2018   Lab Results  Component Value Date   HDL 38.10 (L) 12/21/2018   Lab Results  Component Value Date   LDLCALC 65 12/21/2018   Lab Results  Component Value Date   TRIG 130.0 12/21/2018   Lab Results  Component Value Date   CHOLHDL 3 12/21/2018   Lab Results  Component Value Date   HGBA1C 6.5 12/21/2018       Assessment & Plan:   Problem List Items Addressed This Visit    Mixed hyperlipidemia    Encouraged heart healthy diet, increase exercise, avoid trans fats, consider a krill oil cap daily      Relevant Orders   Lipid panel   Essential hypertension    Well controlled, no changes to meds. Encouraged heart healthy diet such as the DASH diet and exercise as tolerated.       Relevant Orders   CBC   Comprehensive metabolic panel   TSH   History of BPH    Normal PSA no symptoms or concerns today      Osteoporosis    Encouraged to get adequate exercise, calcium and vitamin d intake  Patient declines Dexa scan      Relevant Orders   Vitamin D (25 hydroxy)   Diabetes type 2, controlled ([place])    hgba1c  acceptable, minimize simple carbs. Increase exercise as tolerated. Continue current meds      Relevant Orders   Hemoglobin A1c   Positive QuantiFERON-TB Gold test    Patient with occasional cough and requests a CXR which is completed. No pulmonary concerns noted      Hearing loss - Primary    Referred to [facility] for evaluation      Relevant Orders   Ambulatory referral to Audiology    Other Visit Diagnoses    Cough       Relevant Orders   DG Chest 2 View (Completed)      I am having Bertell Maria. Hanlon maintain his aspirin, Calcium Carbonate (CALCIUM 500 PO), multivitamin, vitamin C, (Ginger, Zingiber officinalis, (GINGER ROOT PO)), ONE TOUCH ULTRA TEST, and simvastatin.  No orders of the defined types were placed in this encounter.    Penni Homans, MD

## 2019-01-08 NOTE — Assessment & Plan Note (Signed)
Patient with occasional cough and requests a CXR which is completed. No pulmonary concerns noted

## 2019-01-08 NOTE — Assessment & Plan Note (Signed)
Referred to [facility] for evaluation

## 2019-07-07 ENCOUNTER — Other Ambulatory Visit: Payer: Medicare Other

## 2019-07-13 ENCOUNTER — Ambulatory Visit (INDEPENDENT_AMBULATORY_CARE_PROVIDER_SITE_OTHER): Payer: Medicare Other | Admitting: Family Medicine

## 2019-07-13 ENCOUNTER — Other Ambulatory Visit: Payer: Self-pay

## 2019-07-13 ENCOUNTER — Encounter: Payer: Self-pay | Admitting: Family Medicine

## 2019-07-13 VITALS — BP 129/84 | HR 69 | Temp 97.1°F | Resp 16 | Ht 68.0 in | Wt 148.2 lb

## 2019-07-13 DIAGNOSIS — M810 Age-related osteoporosis without current pathological fracture: Secondary | ICD-10-CM

## 2019-07-13 DIAGNOSIS — E782 Mixed hyperlipidemia: Secondary | ICD-10-CM

## 2019-07-13 DIAGNOSIS — Z87438 Personal history of other diseases of male genital organs: Secondary | ICD-10-CM

## 2019-07-13 DIAGNOSIS — R319 Hematuria, unspecified: Secondary | ICD-10-CM | POA: Diagnosis not present

## 2019-07-13 DIAGNOSIS — Z0000 Encounter for general adult medical examination without abnormal findings: Secondary | ICD-10-CM

## 2019-07-13 DIAGNOSIS — Z23 Encounter for immunization: Secondary | ICD-10-CM | POA: Diagnosis not present

## 2019-07-13 DIAGNOSIS — E119 Type 2 diabetes mellitus without complications: Secondary | ICD-10-CM

## 2019-07-13 DIAGNOSIS — I10 Essential (primary) hypertension: Secondary | ICD-10-CM | POA: Diagnosis not present

## 2019-07-13 DIAGNOSIS — K635 Polyp of colon: Secondary | ICD-10-CM

## 2019-07-13 MED ORDER — ONETOUCH ULTRA VI STRP: ORAL_STRIP | 12 refills | Status: DC

## 2019-07-13 MED ORDER — ONETOUCH ULTRA 2 W/DEVICE KIT: PACK | 0 refills | Status: DC

## 2019-07-13 MED ORDER — ONETOUCH ULTRASOFT LANCETS MISC: 5 refills | Status: DC

## 2019-07-13 NOTE — Assessment & Plan Note (Signed)
Patient encouraged to maintain heart healthy diet, regular exercise, adequate sleep. Consider daily probiotics. Take medications as prescribed. Labs ordered and reviewed 

## 2019-07-13 NOTE — Patient Instructions (Addendum)
Shingrix is the new shingles shot, 2 shots over 2-6 months at the pharmacy  Home Blood pressure cuff by [employer], upper arm  Pulse oximeter can get at pharmacy or on [employer] or [employer]  Check vitals weekly and when sick, blood pressure, pulse, oxygen level, temperature and weight and blood sugar   Consider taking a multivitamin with minerals and vitamin D 1000 IU daily   We ordered a bone density test to check the strength of his bones it will be done in the radiology suite of the [facility]  Preventive Care 65 Years and Older, Male Preventive care refers to lifestyle choices and visits with your health care provider that can promote health and wellness. This includes:  A yearly physical exam. This is also called an annual well check.  Regular dental and eye exams.  Immunizations.  Screening for certain conditions.  Healthy lifestyle choices, such as diet and exercise. What can I expect for my preventive care visit? Physical exam Your health care provider will check:  Height and weight. These may be used to calculate body mass index (BMI), which is a measurement that tells if you are at a healthy weight.  Heart rate and blood pressure.  Your skin for abnormal spots. Counseling Your health care provider may ask you questions about:  Alcohol, tobacco, and drug use.  Emotional well-being.  Home and relationship well-being.  Sexual activity.  Eating habits.  History of falls.  Memory and ability to understand (cognition).  Work and work Statistician. What immunizations do I need?  Influenza (flu) vaccine  This is recommended every year. Tetanus, diphtheria, and pertussis (Tdap) vaccine  You may need a Td booster every 10 years. Varicella (chickenpox) vaccine  You may need this vaccine if you have not already been vaccinated. Zoster (shingles) vaccine  You may need this after age 5. Pneumococcal conjugate (PCV13) vaccine  One dose is recommended after  age 97. Pneumococcal polysaccharide (PPSV23) vaccine  One dose is recommended after age 64. Measles, mumps, and rubella (MMR) vaccine  You may need at least one dose of MMR if you were born in 1957 or later. You may also need a second dose. Meningococcal conjugate (MenACWY) vaccine  You may need this if you have certain conditions. Hepatitis A vaccine  You may need this if you have certain conditions or if you travel or work in places where you may be exposed to hepatitis A. Hepatitis B vaccine  You may need this if you have certain conditions or if you travel or work in places where you may be exposed to hepatitis B. Haemophilus influenzae type b (Hib) vaccine  You may need this if you have certain conditions. You may receive vaccines as individual doses or as more than one vaccine together in one shot (combination vaccines). Talk with your health care provider about the risks and benefits of combination vaccines. What tests do I need? Blood tests  Lipid and cholesterol levels. These may be checked every 5 years, or more frequently depending on your overall health.  Hepatitis C test.  Hepatitis B test. Screening  Lung cancer screening. You may have this screening every year starting at age 62 if you have a 30-pack-year history of smoking and currently smoke or have quit within the past 15 years.  Colorectal cancer screening. All adults should have this screening starting at age 47 and continuing until age 3. Your health care provider may recommend screening at age 51 if you are at increased  risk. You will have tests every 1-10 years, depending on your results and the type of screening test.  Prostate cancer screening. Recommendations will vary depending on your family history and other risks.  Diabetes screening. This is done by checking your blood sugar (glucose) after you have not eaten for a while (fasting). You may have this done every 1-3 years.  Abdominal aortic aneurysm  (AAA) screening. You may need this if you are a current or former smoker.  Sexually transmitted disease (STD) testing. Follow these instructions at home: Eating and drinking  Eat a diet that includes fresh fruits and vegetables, whole grains, lean protein, and low-fat dairy products. Limit your intake of foods with high amounts of sugar, saturated fats, and salt.  Take vitamin and mineral supplements as recommended by your health care provider.  Do not drink alcohol if your health care provider tells you not to drink.  If you drink alcohol: ? Limit how much you have to 0-2 drinks a day. ? Be aware of how much alcohol is in your drink. In the [place] one drink equals one 12 oz bottle of beer (355 mL), one 5 oz glass of wine (148 mL), or one 1 oz glass of hard liquor (44 mL). Lifestyle  Take daily care of your teeth and gums.  Stay active. Exercise for at least 30 minutes on 5 or more days each week.  Do not use any products that contain nicotine or tobacco, such as cigarettes, e-cigarettes, and chewing tobacco. If you need help quitting, ask your health care provider.  If you are sexually active, practice safe sex. Use a condom or other form of protection to prevent STIs (sexually transmitted infections).  Talk with your health care provider about taking a low-dose aspirin or statin. What's next?  Visit your health care provider once a year for a well check visit.  Ask your health care provider how often you should have your eyes and teeth checked.  Stay up to date on all vaccines. This information is not intended to replace advice given to you by your health care provider. Make sure you discuss any questions you have with your health care provider. Document Released: 11/15/2015 Document Revised: 10/13/2018 Document Reviewed: 10/13/2018 Elsevier Patient Education  2020 [employer].

## 2019-07-14 LAB — URINALYSIS
Bilirubin Urine: NEGATIVE
Hgb urine dipstick: NEGATIVE
Ketones, ur: NEGATIVE
Leukocytes,Ua: NEGATIVE
Nitrite: NEGATIVE
Specific Gravity, Urine: 1.02 (ref 1.000–1.030)
Total Protein, Urine: NEGATIVE
Urine Glucose: NEGATIVE
Urobilinogen, UA: 0.2 (ref 0.0–1.0)
pH: 6.5 (ref 5.0–8.0)

## 2019-07-14 LAB — COMPREHENSIVE METABOLIC PANEL
ALT: 27 U/L (ref 0–53)
AST: 22 U/L (ref 0–37)
Albumin: 4.3 g/dL (ref 3.5–5.2)
Alkaline Phosphatase: 75 U/L (ref 39–117)
BUN: 18 mg/dL (ref 6–23)
CO2: 25 mEq/L (ref 19–32)
Calcium: 9.1 mg/dL (ref 8.4–10.5)
Chloride: 107 mEq/L (ref 96–112)
Creatinine, Ser: 0.83 mg/dL (ref 0.40–1.50)
GFR: 89.52 mL/min (ref >60.00)
Glucose, Bld: 119 mg/dL — ABNORMAL HIGH (ref 70–99)
Potassium: 4 mEq/L (ref 3.5–5.1)
Sodium: 140 mEq/L (ref 135–145)
Total Bilirubin: 0.5 mg/dL (ref 0.2–1.2)
Total Protein: 7.3 g/dL (ref 6.0–8.3)

## 2019-07-14 LAB — CBC
HCT: 38.9 % — ABNORMAL LOW (ref 39.0–52.0)
Hemoglobin: 13.4 g/dL (ref 13.0–17.0)
MCHC: 34.3 g/dL (ref 30.0–36.0)
MCV: 100.4 fl — ABNORMAL HIGH (ref 78.0–100.0)
Platelets: 170 10*3/uL (ref 150.0–400.0)
RBC: 3.88 Mil/uL — ABNORMAL LOW (ref 4.22–5.81)
RDW: 12.6 % (ref 11.5–15.5)
WBC: 6.3 10*3/uL (ref 4.0–10.5)

## 2019-07-14 LAB — LIPID PANEL
Cholesterol: 146 mg/dL (ref 0–200)
HDL: 37.7 mg/dL — ABNORMAL LOW (ref >39.00)
NonHDL: 108.61
Total CHOL/HDL Ratio: 4
Triglycerides: 220 mg/dL — ABNORMAL HIGH (ref 0.0–149.0)
VLDL: 44 mg/dL — ABNORMAL HIGH (ref 0.0–40.0)

## 2019-07-14 LAB — PSA: PSA: 3.06 ng/mL (ref 0.10–4.00)

## 2019-07-14 LAB — TSH: TSH: 1.67 u[IU]/mL (ref 0.35–4.50)

## 2019-07-14 LAB — LDL CHOLESTEROL, DIRECT: Direct LDL: 70 mg/dL

## 2019-07-14 LAB — HEMOGLOBIN A1C: Hgb A1c MFr Bld: 6.6 % — ABNORMAL HIGH (ref 4.6–6.5)

## 2019-07-16 NOTE — Assessment & Plan Note (Signed)
Denies symptoms and PSA normal

## 2019-07-16 NOTE — Assessment & Plan Note (Signed)
hgba1c acceptable, minimize simple carbs. Increase exercise as tolerated. Continue current meds 

## 2019-07-16 NOTE — Progress Notes (Signed)
Subjective:    Patient ID: Marvin Mcdonald, male    DOB: 08/02/1941, 78 y.o.   MRN: [id number]  Chief Complaint  Patient presents with  . Annual Exam    HPI Patient is in today for annual preventative exam and follow up on chronic medical concerns including hypertension, diabetes and hyperlipidemia. No recent febrile illness or hospitalizations. History is taken with the help of an interpretor. His only stressor is his wife's dementia is worsening. With family support he is still able to care for her at home. Is eating well and stays active, plays tennis regularly. Denies CP/palp/SOB/HA/congestion/fevers/GI or GU c/o. Taking meds as prescribed  Past Medical History:  Diagnosis Date  . Acute URI 01/03/2013  . BPH (benign prostatic hypertrophy)   . Colon cancer screening 04/01/2017  . Colon polyps   . Dental infection 10/07/2013  . Diabetes mellitus    diet and exercise controlled  . Hematuria 04/07/2013  . Hyperlipidemia   . Low back pain   . Mass of neck 10/07/2013  . Osteoporosis   . Pain in joint, shoulder region 10/07/2013    Past Surgical History:  Procedure Laterality Date  . COLONOSCOPY    . HAND SURGERY    . POLYPECTOMY      Family History  Problem Relation Age of Onset  . Hypertension Sister   . Hypertension Brother   . Depression Paternal Grandmother   . Hypertension Brother   . Hypertension Brother   . Colon cancer Neg Hx   . Stomach cancer Neg Hx   . Esophageal cancer Neg Hx     Social History   Socioeconomic History  . Marital status: Married    Spouse name: Marvin Mcdonald  . Number of children: Not on file  . Years of education: Not on file  . Highest education level: Not on file  Occupational History    Employer: [employer]  . Financial resource strain: Not on file  . Food insecurity    Worry: Not on file    Inability: Not on file  . Transportation needs    Medical: Not on file    Non-medical: Not on file  Tobacco Use  .  Smoking status: Former Smoker    Packs/day: 0.50    Years: 25.00    Pack years: 12.50    Types: Cigarettes    Quit date: 07/27/1996    Years since quitting: 22.9  . Smokeless tobacco: Never Used  Substance and Sexual Activity  . Alcohol use: No    Alcohol/week: 0.0 standard drinks  . Drug use: No  . Sexual activity: Not on file  Lifestyle  . Physical activity    Days per week: Not on file    Minutes per session: Not on file  . Stress: Not on file  Relationships  . Social Herbalist on phone: Not on file    Gets together: Not on file    Attends religious service: Not on file    Active member of club or organization: Not on file    Attends meetings of clubs or organizations: Not on file    Relationship status: Not on file  . Intimate partner violence    Fear of current or ex partner: Not on file    Emotionally abused: Not on file    Physically abused: Not on file    Forced sexual activity: Not on file  Other Topics Concern  . Not on file  Social History Narrative   Supportive daughter    Outpatient Medications Prior to Visit  Medication Sig Dispense Refill  . aspirin 81 MG tablet Take 81 mg by mouth daily.      . Calcium Carbonate (CALCIUM 500 PO) Take by mouth 2 (two) times daily.      . Ginger, Zingiber officinalis, (GINGER ROOT PO) Take by mouth.      . Multiple Vitamin (MULTIVITAMIN) capsule Take 1 capsule by mouth daily.      . ONE TOUCH ULTRA TEST test strip USE TO TEST BLOOD SUGAR EVERY DAY 100 each 3  . simvastatin (ZOCOR) 40 MG tablet TAKE 1 TABLET(40 MG) BY MOUTH DAILY 90 tablet 1  . vitamin C (ASCORBIC ACID) 500 MG tablet Take 1,000 mg by mouth 2 (two) times daily.      No facility-administered medications prior to visit.     No Known Allergies  Review of Systems  Constitutional: Negative for chills, fever and malaise/fatigue.  HENT: Negative for congestion and hearing loss.   Eyes: Negative for discharge.  Respiratory: Negative for cough,  sputum production and shortness of breath.   Cardiovascular: Negative for chest pain, palpitations and leg swelling.  Gastrointestinal: Negative for abdominal pain, blood in stool, constipation, diarrhea, heartburn, nausea and vomiting.  Genitourinary: Negative for dysuria, frequency, hematuria and urgency.  Musculoskeletal: Negative for back pain, falls and myalgias.  Skin: Negative for rash.  Neurological: Negative for dizziness, sensory change, loss of consciousness, weakness and headaches.  Endo/Heme/Allergies: Negative for environmental allergies. Does not bruise/bleed easily.  Psychiatric/Behavioral: Negative for depression and suicidal ideas. The patient is not nervous/anxious and does not have insomnia.        Objective:    Physical Exam Vitals signs and nursing note reviewed.  Constitutional:      General: He is not in acute distress.    Appearance: He is well-developed. He is not ill-appearing.  HENT:     Head: Normocephalic and atraumatic.     Right Ear: Tympanic membrane, ear canal and external ear normal. There is no impacted cerumen.     Left Ear: Tympanic membrane, ear canal and external ear normal. There is no impacted cerumen.     Nose: Nose normal. No congestion or rhinorrhea.  Eyes:     General:        Right eye: No discharge.        Left eye: No discharge.  Neck:     Musculoskeletal: Normal range of motion and neck supple.  Cardiovascular:     Rate and Rhythm: Normal rate and regular rhythm.     Heart sounds: Normal heart sounds. No murmur.  Pulmonary:     Effort: Pulmonary effort is normal.     Breath sounds: Normal breath sounds. No wheezing.  Abdominal:     General: Bowel sounds are normal.     Palpations: Abdomen is soft. There is no mass.     Tenderness: There is no abdominal tenderness. There is no rebound.  Musculoskeletal: Normal range of motion.        General: No swelling or tenderness.  Skin:    General: Skin is warm and dry.     Coloration:  Skin is not jaundiced.  Neurological:     Mental Status: He is alert. He is disoriented.     Cranial Nerves: No cranial nerve deficit.     Deep Tendon Reflexes: Reflexes normal.  Psychiatric:        Mood and Affect: Mood normal.  Behavior: Behavior normal.     BP 129/84   Pulse 69   Temp (!) 97.1 F (36.2 C) (Skin)   Resp 16   Ht 5' 8"  (1.727 m)   Wt 148 lb 4 oz (67.2 kg)   SpO2 97%   BMI 22.54 kg/m  Wt Readings from Last 3 Encounters:  07/13/19 148 lb 4 oz (67.2 kg)  01/06/19 152 lb 9.6 oz (69.2 kg)  06/16/18 150 lb 3.2 oz (68.1 kg)    Diabetic Foot Exam - Simple   No data filed     Lab Results  Component Value Date   WBC 6.3 07/13/2019   HGB 13.4 07/13/2019   HCT 38.9 (L) 07/13/2019   PLT 170.0 07/13/2019   GLUCOSE 119 (H) 07/13/2019   CHOL 146 07/13/2019   TRIG 220.0 (H) 07/13/2019   HDL 37.70 (L) 07/13/2019   LDLDIRECT 70.0 07/13/2019   LDLCALC 65 12/21/2018   ALT 27 07/13/2019   AST 22 07/13/2019   NA 140 07/13/2019   K 4.0 07/13/2019   CL 107 07/13/2019   CREATININE 0.83 07/13/2019   BUN 18 07/13/2019   CO2 25 07/13/2019   TSH 1.67 07/13/2019   PSA 3.06 07/13/2019   HGBA1C 6.6 (H) 07/13/2019   MICROALBUR <0.7 12/21/2018    Lab Results  Component Value Date   TSH 1.67 07/13/2019   Lab Results  Component Value Date   WBC 6.3 07/13/2019   HGB 13.4 07/13/2019   HCT 38.9 (L) 07/13/2019   MCV 100.4 (H) 07/13/2019   PLT 170.0 07/13/2019   Lab Results  Component Value Date   NA 140 07/13/2019   K 4.0 07/13/2019   CO2 25 07/13/2019   GLUCOSE 119 (H) 07/13/2019   BUN 18 07/13/2019   CREATININE 0.83 07/13/2019   BILITOT 0.5 07/13/2019   ALKPHOS 75 07/13/2019   AST 22 07/13/2019   ALT 27 07/13/2019   PROT 7.3 07/13/2019   ALBUMIN 4.3 07/13/2019   CALCIUM 9.1 07/13/2019   GFR 89.52 07/13/2019   Lab Results  Component Value Date   CHOL 146 07/13/2019   Lab Results  Component Value Date   HDL 37.70 (L) 07/13/2019   Lab Results   Component Value Date   LDLCALC 65 12/21/2018   Lab Results  Component Value Date   TRIG 220.0 (H) 07/13/2019   Lab Results  Component Value Date   CHOLHDL 4 07/13/2019   Lab Results  Component Value Date   HGBA1C 6.6 (H) 07/13/2019       Assessment & Plan:   Problem List Items Addressed This Visit    Mixed hyperlipidemia    Tolerating statin, encouraged heart healthy diet, avoid trans fats, minimize simple carbs and saturated fats. Increase exercise as tolerated labs ordered and reviewed.       Relevant Orders   Lipid panel (Completed)   Essential hypertension - Primary    Well controlled, no changes to meds. Encouraged heart healthy diet such as the DASH diet and exercise as tolerated.       Relevant Orders   CBC (Completed)   Comprehensive metabolic panel (Completed)   TSH (Completed)   History of BPH    Denies symptoms and PSA normal      Relevant Orders   PSA (Completed)   Osteoporosis    Encouraged to get adequate exercise, calcium and vitamin d intake. Dexa scan is ordered      Relevant Orders   DG Bone Density   Diabetes  type 2, controlled ([place])    hgba1c acceptable, minimize simple carbs. Increase exercise as tolerated. Continue current meds      Relevant Orders   Hemoglobin A1c (Completed)   Hematuria   Relevant Orders   Urinalysis (Completed)   Preventative health care    Patient encouraged to maintain heart healthy diet, regular exercise, adequate sleep. Consider daily probiotics. Take medications as prescribed. Labs ordered and reviewed      Colon polyps    Will proceed with GI referral if he agrees.       Other Visit Diagnoses    Need for immunization against influenza       Relevant Orders   Flu Vaccine QUAD High Dose(Fluad) (Completed)      I am having Marvin Mcdonald. Cotterman start on ONE TOUCH ULTRA 2, onetouch ultrasoft, and OneTouch Ultra. I am also having him maintain his aspirin, Calcium Carbonate (CALCIUM 500 PO), multivitamin,  vitamin C, (Ginger, Zingiber officinalis, (GINGER ROOT PO)), ONE TOUCH ULTRA TEST, and simvastatin.  Meds ordered this encounter  Medications  . Blood Glucose Monitoring Suppl (ONE TOUCH ULTRA 2) w/Device KIT    Sig: Use glucometer to test sugars once daily. Dx E11.9    Dispense:  1 kit    Refill:  0  . Lancets (ONETOUCH ULTRASOFT) lancets    Sig: Use as instructed to test sugars once daily. Dx E11.9    Dispense:  100 each    Refill:  5  . glucose blood (ONETOUCH ULTRA) test strip    Sig: Use as instructed to test sugars once daily. Dx E11.9    Dispense:  100 each    Refill:  12     Penni Homans, MD

## 2019-07-16 NOTE — Assessment & Plan Note (Signed)
Well controlled, no changes to meds. Encouraged heart healthy diet such as the DASH diet and exercise as tolerated.  °

## 2019-07-16 NOTE — Assessment & Plan Note (Signed)
Tolerating statin, encouraged heart healthy diet, avoid trans fats, minimize simple carbs and saturated fats. Increase exercise as tolerated labs ordered and reviewed.

## 2019-07-16 NOTE — Assessment & Plan Note (Signed)
Will proceed with GI referral if he agrees.

## 2019-07-16 NOTE — Assessment & Plan Note (Signed)
Encouraged to get adequate exercise, calcium and vitamin d intake. Dexa scan is ordered. 

## 2019-07-18 ENCOUNTER — Other Ambulatory Visit: Payer: Self-pay | Admitting: *Deleted

## 2019-07-18 MED ORDER — ATORVASTATIN CALCIUM 40 MG PO TABS: 40.0000 mg | ORAL_TABLET | Freq: Every day | ORAL | 1 refills | Status: DC

## 2019-09-18 ENCOUNTER — Telehealth: Payer: Self-pay | Admitting: Family Medicine

## 2019-09-18 DIAGNOSIS — H547 Unspecified visual loss: Secondary | ICD-10-CM

## 2019-09-18 NOTE — Telephone Encounter (Signed)
Referral Request - Has patient seen PCP for this complaint? No. *If NO, is insurance requiring patient see PCP for this issue before PCP can refer them? Referral for which specialty: Ophthalmologist Preferred provider/office: [facility] or [facility] Reason for referral:Problems with vision

## 2019-09-19 NOTE — Telephone Encounter (Signed)
Referral placed.

## 2019-10-03 ENCOUNTER — Telehealth: Payer: Self-pay

## 2019-10-03 DIAGNOSIS — Z0100 Encounter for examination of eyes and vision without abnormal findings: Secondary | ICD-10-CM

## 2019-10-03 NOTE — Telephone Encounter (Signed)
Copied from [place] [id number]. Topic: Referral - Request for Referral >> Oct 02, 2019 10:50 AM Rainey Pines A wrote:  *If NO, is insurance requiring patient see PCP for this issue before PCP can refer them? Referral for which specialty: Opthamology Preferred provider/office: [facility]  [id number] [place] [phone] Patients daughter would like a callback at [phone].

## 2019-10-13 DIAGNOSIS — H2513 Age-related nuclear cataract, bilateral: Secondary | ICD-10-CM | POA: Diagnosis not present

## 2019-10-13 DIAGNOSIS — H25013 Cortical age-related cataract, bilateral: Secondary | ICD-10-CM | POA: Diagnosis not present

## 2019-10-13 DIAGNOSIS — R7309 Other abnormal glucose: Secondary | ICD-10-CM | POA: Diagnosis not present

## 2019-10-13 LAB — HM DIABETES EYE EXAM

## 2019-10-13 NOTE — Telephone Encounter (Signed)
Marvin Mcdonald, Patient daughter is calling to see if Dr. Charlett Blake can help assist the patient with a sooner appointment to [facility]  [id number] [place] [phone]. Patient was offered an appt of December 12, 2018.  Patient is concerned about his sight.  And he is concerned that he may loss his eyesight  Patient is open to going to a different office if there is a sooner appt.  Patient is concerned and at this time he wants to go to the Emergency Room. The daughter does not want to take the patient to ER, for fear of a high medical bill. However the patient is fearful that For his eye sight because he is concerned that he may loss his sight. Please advise [phone]

## 2019-10-13 NOTE — Telephone Encounter (Signed)
Left detailed message on machine that usually you do not need a referral from [place] unless insurance requests it.  They should be able to call any office of their choice to get set up.  They can also get put on a cancellation list at the office that we sent referral to if they like.

## 2019-10-16 ENCOUNTER — Encounter: Payer: Self-pay | Admitting: Family Medicine

## 2019-10-16 DIAGNOSIS — R7309 Other abnormal glucose: Secondary | ICD-10-CM | POA: Diagnosis not present

## 2019-10-16 DIAGNOSIS — H25013 Cortical age-related cataract, bilateral: Secondary | ICD-10-CM | POA: Diagnosis not present

## 2019-10-16 DIAGNOSIS — H2589 Other age-related cataract: Secondary | ICD-10-CM | POA: Diagnosis not present

## 2019-10-16 DIAGNOSIS — H2513 Age-related nuclear cataract, bilateral: Secondary | ICD-10-CM | POA: Diagnosis not present

## 2019-10-16 DIAGNOSIS — H2511 Age-related nuclear cataract, right eye: Secondary | ICD-10-CM | POA: Diagnosis not present

## 2019-10-24 ENCOUNTER — Telehealth: Payer: Self-pay | Admitting: *Deleted

## 2019-10-24 DIAGNOSIS — E119 Type 2 diabetes mellitus without complications: Secondary | ICD-10-CM

## 2019-10-24 DIAGNOSIS — E782 Mixed hyperlipidemia: Secondary | ICD-10-CM

## 2019-10-24 DIAGNOSIS — I10 Essential (primary) hypertension: Secondary | ICD-10-CM

## 2019-10-24 NOTE — Telephone Encounter (Signed)
Lab appointment in 2 weeks with wife Rhyan Ammann, orders in.  Appointment in early April with wife.

## 2019-11-07 ENCOUNTER — Other Ambulatory Visit (INDEPENDENT_AMBULATORY_CARE_PROVIDER_SITE_OTHER): Payer: Medicare Other

## 2019-11-07 ENCOUNTER — Other Ambulatory Visit: Payer: Self-pay

## 2019-11-07 DIAGNOSIS — I10 Essential (primary) hypertension: Secondary | ICD-10-CM

## 2019-11-07 DIAGNOSIS — E119 Type 2 diabetes mellitus without complications: Secondary | ICD-10-CM

## 2019-11-07 DIAGNOSIS — E782 Mixed hyperlipidemia: Secondary | ICD-10-CM

## 2019-11-07 DIAGNOSIS — M810 Age-related osteoporosis without current pathological fracture: Secondary | ICD-10-CM | POA: Diagnosis not present

## 2019-11-08 DIAGNOSIS — H2511 Age-related nuclear cataract, right eye: Secondary | ICD-10-CM | POA: Diagnosis not present

## 2019-11-08 DIAGNOSIS — H25811 Combined forms of age-related cataract, right eye: Secondary | ICD-10-CM | POA: Diagnosis not present

## 2019-11-08 DIAGNOSIS — H2589 Other age-related cataract: Secondary | ICD-10-CM | POA: Diagnosis not present

## 2019-11-08 LAB — COMPREHENSIVE METABOLIC PANEL
ALT: 36 U/L (ref 0–53)
AST: 28 U/L (ref 0–37)
Albumin: 4.5 g/dL (ref 3.5–5.2)
Alkaline Phosphatase: 92 U/L (ref 39–117)
BUN: 13 mg/dL (ref 6–23)
CO2: 29 mEq/L (ref 19–32)
Calcium: 10.1 mg/dL (ref 8.4–10.5)
Chloride: 102 mEq/L (ref 96–112)
Creatinine, Ser: 0.97 mg/dL (ref 0.40–1.50)
GFR: 74.72 mL/min (ref >60.00)
Glucose, Bld: 200 mg/dL — ABNORMAL HIGH (ref 70–99)
Potassium: 4.2 mEq/L (ref 3.5–5.1)
Sodium: 138 mEq/L (ref 135–145)
Total Bilirubin: 0.7 mg/dL (ref 0.2–1.2)
Total Protein: 7.8 g/dL (ref 6.0–8.3)

## 2019-11-08 LAB — CBC
HCT: 41 % (ref 39.0–52.0)
Hemoglobin: 14 g/dL (ref 13.0–17.0)
MCHC: 34.1 g/dL (ref 30.0–36.0)
MCV: 99.7 fl (ref 78.0–100.0)
Platelets: 181 10*3/uL (ref 150.0–400.0)
RBC: 4.12 Mil/uL — ABNORMAL LOW (ref 4.22–5.81)
RDW: 12.5 % (ref 11.5–15.5)
WBC: 6.6 10*3/uL (ref 4.0–10.5)

## 2019-11-08 LAB — LIPID PANEL
Cholesterol: 135 mg/dL (ref 0–200)
HDL: 37.6 mg/dL — ABNORMAL LOW (ref >39.00)
NonHDL: 97.81
Total CHOL/HDL Ratio: 4
Triglycerides: 253 mg/dL — ABNORMAL HIGH (ref 0.0–149.0)
VLDL: 50.6 mg/dL — ABNORMAL HIGH (ref 0.0–40.0)

## 2019-11-08 LAB — LDL CHOLESTEROL, DIRECT: Direct LDL: 63 mg/dL

## 2019-11-08 LAB — VITAMIN D 25 HYDROXY (VIT D DEFICIENCY, FRACTURES): VITD: 69.59 ng/mL (ref 30.00–100.00)

## 2019-11-08 LAB — HEMOGLOBIN A1C: Hgb A1c MFr Bld: 7.1 % — ABNORMAL HIGH (ref 4.6–6.5)

## 2019-11-08 LAB — TSH: TSH: 1.6 u[IU]/mL (ref 0.35–4.50)

## 2019-11-14 DIAGNOSIS — H25012 Cortical age-related cataract, left eye: Secondary | ICD-10-CM | POA: Diagnosis not present

## 2019-11-14 DIAGNOSIS — H2512 Age-related nuclear cataract, left eye: Secondary | ICD-10-CM | POA: Diagnosis not present

## 2019-11-23 ENCOUNTER — Ambulatory Visit: Payer: Medicare Other | Attending: Internal Medicine

## 2019-11-23 DIAGNOSIS — Z23 Encounter for immunization: Secondary | ICD-10-CM | POA: Insufficient documentation

## 2019-11-23 NOTE — Progress Notes (Signed)
   Covid-19 Vaccination Clinic  Name:  Nayquan Strimple    MRN: [id number] DOB: 1941/03/30  11/23/2019  Mr. Leatherbury was observed post Covid-19 immunization for 15 minutes without incidence. He was provided with Vaccine Information Sheet and instruction to access the V-Safe system.   Mr. Eimers was instructed to call 911 with any severe reactions post vaccine: Marland Kitchen Difficulty breathing  . Swelling of your face and throat  . A fast heartbeat  . A bad rash all over your body  . Dizziness and weakness    Immunizations Administered    Name Date Dose VIS Date Route   Pfizer COVID-19 Vaccine 11/23/2019 10:02 AM 0.3 mL 10/13/2019 Intramuscular   Manufacturer: [place]   Lot: [id number]   [place]: [id number]

## 2019-12-14 ENCOUNTER — Ambulatory Visit: Payer: Medicare Other | Attending: Internal Medicine

## 2019-12-14 ENCOUNTER — Other Ambulatory Visit: Payer: Self-pay

## 2019-12-14 DIAGNOSIS — Z23 Encounter for immunization: Secondary | ICD-10-CM | POA: Insufficient documentation

## 2019-12-14 NOTE — Progress Notes (Signed)
   Covid-19 Vaccination Clinic  Name:  Marvin Mcdonald    MRN: [id number] DOB: 1941-03-06  12/14/2019  Mr. Earwood was observed post Covid-19 immunization for 15 minutes without incidence. He was provided with Vaccine Information Sheet and instruction to access the V-Safe system.   Mr. Oblander was instructed to call 911 with any severe reactions post vaccine: Marland Kitchen Difficulty breathing  . Swelling of your face and throat  . A fast heartbeat  . A bad rash all over your body  . Dizziness and weakness    Immunizations Administered    Name Date Dose VIS Date Route   Pfizer COVID-19 Vaccine 12/14/2019 10:12 AM 0.3 mL 10/13/2019 Intramuscular   Manufacturer: [employer]   Lot: [id number]   [place]: [id number]

## 2019-12-22 ENCOUNTER — Other Ambulatory Visit: Payer: Self-pay | Admitting: Family Medicine

## 2020-01-10 DIAGNOSIS — H25812 Combined forms of age-related cataract, left eye: Secondary | ICD-10-CM | POA: Diagnosis not present

## 2020-01-10 DIAGNOSIS — H2512 Age-related nuclear cataract, left eye: Secondary | ICD-10-CM | POA: Diagnosis not present

## 2020-01-10 DIAGNOSIS — H25012 Cortical age-related cataract, left eye: Secondary | ICD-10-CM | POA: Diagnosis not present

## 2020-01-11 ENCOUNTER — Ambulatory Visit: Payer: Medicare Other | Admitting: Family Medicine

## 2020-01-23 ENCOUNTER — Other Ambulatory Visit: Payer: Self-pay | Admitting: Family Medicine

## 2020-02-06 ENCOUNTER — Ambulatory Visit (INDEPENDENT_AMBULATORY_CARE_PROVIDER_SITE_OTHER): Payer: Medicare Other | Admitting: Family Medicine

## 2020-02-06 ENCOUNTER — Other Ambulatory Visit: Payer: Self-pay

## 2020-02-06 VITALS — BP 125/66 | HR 68 | Temp 97.0°F | Resp 12 | Ht 68.0 in | Wt 149.2 lb

## 2020-02-06 DIAGNOSIS — Z7189 Other specified counseling: Secondary | ICD-10-CM

## 2020-02-06 DIAGNOSIS — H409 Unspecified glaucoma: Secondary | ICD-10-CM

## 2020-02-06 DIAGNOSIS — E785 Hyperlipidemia, unspecified: Secondary | ICD-10-CM | POA: Diagnosis not present

## 2020-02-06 DIAGNOSIS — E782 Mixed hyperlipidemia: Secondary | ICD-10-CM

## 2020-02-06 DIAGNOSIS — I10 Essential (primary) hypertension: Secondary | ICD-10-CM | POA: Diagnosis not present

## 2020-02-06 DIAGNOSIS — E119 Type 2 diabetes mellitus without complications: Secondary | ICD-10-CM | POA: Diagnosis not present

## 2020-02-06 DIAGNOSIS — M810 Age-related osteoporosis without current pathological fracture: Secondary | ICD-10-CM

## 2020-02-06 LAB — COMPREHENSIVE METABOLIC PANEL
ALT: 45 U/L (ref 0–53)
AST: 36 U/L (ref 0–37)
Albumin: 4.6 g/dL (ref 3.5–5.2)
Alkaline Phosphatase: 89 U/L (ref 39–117)
BUN: 15 mg/dL (ref 6–23)
CO2: 26 mEq/L (ref 19–32)
Calcium: 9.4 mg/dL (ref 8.4–10.5)
Chloride: 102 mEq/L (ref 96–112)
Creatinine, Ser: 0.9 mg/dL (ref 0.40–1.50)
GFR: 81.41 mL/min (ref >60.00)
Glucose, Bld: 130 mg/dL — ABNORMAL HIGH (ref 70–99)
Potassium: 4 mEq/L (ref 3.5–5.1)
Sodium: 137 mEq/L (ref 135–145)
Total Bilirubin: 0.7 mg/dL (ref 0.2–1.2)
Total Protein: 7.7 g/dL (ref 6.0–8.3)

## 2020-02-06 LAB — TSH: TSH: 2.14 u[IU]/mL (ref 0.35–4.50)

## 2020-02-06 LAB — CBC
HCT: 38.9 % — ABNORMAL LOW (ref 39.0–52.0)
Hemoglobin: 13.4 g/dL (ref 13.0–17.0)
MCHC: 34.4 g/dL (ref 30.0–36.0)
MCV: 99.6 fl (ref 78.0–100.0)
Platelets: 172 10*3/uL (ref 150.0–400.0)
RBC: 3.9 Mil/uL — ABNORMAL LOW (ref 4.22–5.81)
RDW: 13.8 % (ref 11.5–15.5)
WBC: 6.7 10*3/uL (ref 4.0–10.5)

## 2020-02-06 LAB — LDL CHOLESTEROL, DIRECT: Direct LDL: 68 mg/dL

## 2020-02-06 LAB — VITAMIN D 25 HYDROXY (VIT D DEFICIENCY, FRACTURES): VITD: 56.26 ng/mL (ref 30.00–100.00)

## 2020-02-06 LAB — LIPID PANEL
Cholesterol: 155 mg/dL (ref 0–200)
HDL: 38.5 mg/dL — ABNORMAL LOW (ref >39.00)
NonHDL: 116.89
Total CHOL/HDL Ratio: 4
Triglycerides: 269 mg/dL — ABNORMAL HIGH (ref 0.0–149.0)
VLDL: 53.8 mg/dL — ABNORMAL HIGH (ref 0.0–40.0)

## 2020-02-06 LAB — HEMOGLOBIN A1C: Hgb A1c MFr Bld: 6.9 % — ABNORMAL HIGH (ref 4.6–6.5)

## 2020-02-06 NOTE — Patient Instructions (Signed)

## 2020-02-07 DIAGNOSIS — H409 Unspecified glaucoma: Secondary | ICD-10-CM | POA: Insufficient documentation

## 2020-02-07 DIAGNOSIS — Z7189 Other specified counseling: Secondary | ICD-10-CM | POA: Insufficient documentation

## 2020-02-07 MED ORDER — ONETOUCH DELICA LANCETS 33G MISC: 5 refills | Status: DC

## 2020-02-07 MED ORDER — ONETOUCH ULTRA 2 W/DEVICE KIT: PACK | 0 refills | Status: DC

## 2020-02-07 MED ORDER — ONETOUCH ULTRA VI STRP: ORAL_STRIP | 5 refills | Status: DC

## 2020-02-07 NOTE — Assessment & Plan Note (Signed)
He has been treated by opthamology and is doing well.

## 2020-02-07 NOTE — Assessment & Plan Note (Signed)
Omron Blood Pressure cuff, upper arm, want BP 100-140/60-90 Pulse oximeter, want oxygen in 90s  Weekly vitals  Take Multivitamin with minerals, selenium Vitamin D 1000-2000 IU daily Probiotic with lactobacillus and bifidophilus Asprin EC 81 mg daily Fish oil caps daily Melatonin 2-5 mg at bedtime  [email or web address]

## 2020-02-07 NOTE — Assessment & Plan Note (Signed)
Tolerating statin, encouraged heart healthy diet, avoid trans fats, minimize simple carbs and saturated fats. Increase exercise as tolerated. Increase his statin

## 2020-02-07 NOTE — Assessment & Plan Note (Signed)
Well controlled, no changes to meds. Encouraged heart healthy diet such as the DASH diet and exercise as tolerated.  °

## 2020-02-07 NOTE — Progress Notes (Signed)
Subjective:    Patient ID: Marvin Mcdonald, male    DOB: 1941-06-12, 79 y.o.   MRN: [id number]  Chief Complaint  Patient presents with  . Follow-up    HPI Patient is in today for follow up on chronic medical concerns. No recent febrile illness or hospitalizations. He is accompanied by his daughter and wife and she helps with translation. He denies any acute complaints. His eyes are doing well after treatment by opthamology. No acute concerns or c/o polyuria or polydipsia. Denies CP/palp/SOB/HA/congestion/fevers/GI or GU c/o. Taking meds as prescribed. He has had both of his COVID shots.   Past Medical History:  Diagnosis Date  . Acute URI 01/03/2013  . BPH (benign prostatic hypertrophy)   . Colon cancer screening 04/01/2017  . Colon polyps   . Dental infection 10/07/2013  . Diabetes mellitus    diet and exercise controlled  . Hematuria 04/07/2013  . Hyperlipidemia   . Low back pain   . Mass of neck 10/07/2013  . Osteoporosis   . Pain in joint, shoulder region 10/07/2013    Past Surgical History:  Procedure Laterality Date  . COLONOSCOPY    . HAND SURGERY    . POLYPECTOMY      Family History  Problem Relation Age of Onset  . Hypertension Sister   . Hypertension Brother   . Depression Paternal Grandmother   . Hypertension Brother   . Hypertension Brother   . Colon cancer Neg Hx   . Stomach cancer Neg Hx   . Esophageal cancer Neg Hx     Social History   Socioeconomic History  . Marital status: Married    Spouse name: Young  . Number of children: Not on file  . Years of education: Not on file  . Highest education level: Not on file  Occupational History    Employer: [employer]   Tobacco Use  . Smoking status: Former Smoker    Packs/day: 0.50    Years: 25.00    Pack years: 12.50    Types: Cigarettes    Quit date: 07/27/1996    Years since quitting: 23.5  . Smokeless tobacco: Never Used  Substance and Sexual Activity  . Alcohol use: No    Alcohol/week:  0.0 standard drinks  . Drug use: No  . Sexual activity: Not on file  Other Topics Concern  . Not on file  Social History Narrative   Supportive daughter   Social Determinants of Health   Financial Resource Strain:   . Difficulty of Paying Living Expenses:   Food Insecurity:   . Worried About Charity fundraiser in the Last Year:   . Arboriculturist in the Last Year:   Transportation Needs:   . Film/video editor (Medical):   Marland Kitchen Lack of Transportation (Non-Medical):   Physical Activity:   . Days of Exercise per Week:   . Minutes of Exercise per Session:   Stress:   . Feeling of Stress :   Social Connections:   . Frequency of Communication with Friends and Family:   . Frequency of Social Gatherings with Friends and Family:   . Attends Religious Services:   . Active Member of Clubs or Organizations:   . Attends Archivist Meetings:   Marland Kitchen Marital Status:   Intimate Partner Violence:   . Fear of Current or Ex-Partner:   . Emotionally Abused:   Marland Kitchen Physically Abused:   . Sexually Abused:     Outpatient Medications  Prior to Visit  Medication Sig Dispense Refill  . aspirin 81 MG tablet Take 81 mg by mouth daily.      Marland Kitchen atorvastatin (LIPITOR) 40 MG tablet TAKE 1 TABLET(40 MG) BY MOUTH DAILY. STOP SIMVASTATIN 90 tablet 1  . Calcium Carbonate (CALCIUM 500 PO) Take by mouth 2 (two) times daily.      . Ginger, Zingiber officinalis, (GINGER ROOT PO) Take by mouth.      . Multiple Vitamin (MULTIVITAMIN) capsule Take 1 capsule by mouth daily.      . vitamin C (ASCORBIC ACID) 500 MG tablet Take 1,000 mg by mouth 2 (two) times daily.     . Blood Glucose Monitoring Suppl (ONE TOUCH ULTRA 2) w/Device KIT Use glucometer to test sugars once daily. Dx E11.9 1 kit 0  . glucose blood (ONETOUCH ULTRA) test strip Use as instructed to test sugars once daily. Dx E11.9 100 each 12  . Lancets (ONETOUCH ULTRASOFT) lancets Use as instructed to test sugars once daily. Dx E11.9 100 each 5  .  ONE TOUCH ULTRA TEST test strip USE TO TEST BLOOD SUGAR EVERY DAY 100 each 3   No facility-administered medications prior to visit.    No Known Allergies  Review of Systems  Constitutional: Negative for fever and malaise/fatigue.  HENT: Negative for congestion.   Eyes: Negative for blurred vision.  Respiratory: Negative for shortness of breath.   Cardiovascular: Negative for chest pain, palpitations and leg swelling.  Gastrointestinal: Negative for abdominal pain, blood in stool and nausea.  Genitourinary: Negative for dysuria and frequency.  Musculoskeletal: Negative for falls.  Skin: Negative for rash.  Neurological: Negative for dizziness, loss of consciousness and headaches.  Endo/Heme/Allergies: Negative for environmental allergies.  Psychiatric/Behavioral: Negative for depression. The patient is not nervous/anxious.        Objective:    Physical Exam  BP 125/66 (BP Location: Left Arm, Cuff Size: Normal)   Pulse 68   Temp (!) 97 F (36.1 C) (Temporal)   Resp 12   Ht _0  (1.727 m)   Wt 149 lb 3.2 oz (67.7 kg)   SpO2 99%   BMI 22.69 kg/m  Wt Readings from Last 3 Encounters:  02/06/20 149 lb 3.2 oz (67.7 kg)  07/13/19 148 lb 4 oz (67.2 kg)  01/06/19 152 lb 9.6 oz (69.2 kg)    Diabetic Foot Exam - Simple   No data filed     Lab Results  Component Value Date   WBC 6.7 02/06/2020   HGB 13.4 02/06/2020   HCT 38.9 (L) 02/06/2020   PLT 172.0 02/06/2020   GLUCOSE 130 (H) 02/06/2020   CHOL 155 02/06/2020   TRIG 269.0 (H) 02/06/2020   HDL 38.50 (L) 02/06/2020   LDLDIRECT 68.0 02/06/2020   LDLCALC 65 12/21/2018   ALT 45 02/06/2020   AST 36 02/06/2020   NA 137 02/06/2020   K 4.0 02/06/2020   CL 102 02/06/2020   CREATININE 0.90 02/06/2020   BUN 15 02/06/2020   CO2 26 02/06/2020   TSH 2.14 02/06/2020   PSA 3.06 07/13/2019   HGBA1C 6.9 (H) 02/06/2020   MICROALBUR <0.7 12/21/2018    Lab Results  Component Value Date   TSH 2.14 02/06/2020   Lab Results   Component Value Date   WBC 6.7 02/06/2020   HGB 13.4 02/06/2020   HCT 38.9 (L) 02/06/2020   MCV 99.6 02/06/2020   PLT 172.0 02/06/2020   Lab Results  Component Value Date   NA 137  02/06/2020   K 4.0 02/06/2020   CO2 26 02/06/2020   GLUCOSE 130 (H) 02/06/2020   BUN 15 02/06/2020   CREATININE 0.90 02/06/2020   BILITOT 0.7 02/06/2020   ALKPHOS 89 02/06/2020   AST 36 02/06/2020   ALT 45 02/06/2020   PROT 7.7 02/06/2020   ALBUMIN 4.6 02/06/2020   CALCIUM 9.4 02/06/2020   GFR 81.41 02/06/2020   Lab Results  Component Value Date   CHOL 155 02/06/2020   Lab Results  Component Value Date   HDL 38.50 (L) 02/06/2020   Lab Results  Component Value Date   LDLCALC 65 12/21/2018   Lab Results  Component Value Date   TRIG 269.0 (H) 02/06/2020   Lab Results  Component Value Date   CHOLHDL 4 02/06/2020   Lab Results  Component Value Date   HGBA1C 6.9 (H) 02/06/2020       Assessment & Plan:   Problem List Items Addressed This Visit    Mixed hyperlipidemia    Tolerating statin, encouraged heart healthy diet, avoid trans fats, minimize simple carbs and saturated fats. Increase exercise as tolerated. Increase his statin      Essential hypertension    Well controlled, no changes to meds. Encouraged heart healthy diet such as the DASH diet and exercise as tolerated.       Relevant Orders   CBC (Completed)   Comprehensive metabolic panel (Completed)   TSH (Completed)   Osteoporosis   Relevant Orders   Comprehensive metabolic panel (Completed)   VITAMIN D 25 Hydroxy (Vit-D Deficiency, Fractures) (Completed)   Diabetes type 2, controlled ([place]) - Primary    hgba1c acceptable, minimize simple carbs. Increase exercise as tolerated. Continue current meds. Is given glucometer, lancets and test strips to check sugars at least twice a week once fasting and once after largest meals and as needed. Can check daily and as needed if willing      Relevant Medications   Blood  Glucose Monitoring Suppl (ONE TOUCH ULTRA 2) w/Device KIT   Other Relevant Orders   Hemoglobin A1c (Completed)   Glaucoma    He has been treated by opthamology and is doing well.       Educated about COVID-19 virus infection    Omron Blood Pressure cuff, upper arm, want BP 100-140/60-90 Pulse oximeter, want oxygen in 90s  Weekly vitals  Take Multivitamin with minerals, selenium Vitamin D 1000-2000 IU daily Probiotic with lactobacillus and bifidophilus Asprin EC 81 mg daily Fish oil caps daily Melatonin 2-5 mg at bedtime  [email or web address]       Other Visit Diagnoses    Hyperlipidemia, unspecified hyperlipidemia type       Relevant Orders   Lipid panel (Completed)   LDL cholesterol, direct (Completed)      I have discontinued Bertell Maria. Burdette's onetouch ultrasoft. I have also changed his ONE TOUCH ULTRA 2. Additionally, I am having him start on OneTouch Delica Lancets 37T and OneTouch Ultra. Lastly, I am having him maintain his aspirin, Calcium Carbonate (CALCIUM 500 PO), multivitamin, vitamin C, (Ginger, Zingiber officinalis, (GINGER ROOT PO)), and atorvastatin.  Meds ordered this encounter  Medications  . OneTouch Delica Lancets 06Y MISC    Sig: Use as directed once daily and as needed.  Dx Code: E11.9    Dispense:  100 each    Refill:  5  . Blood Glucose Monitoring Suppl (ONE TOUCH ULTRA 2) w/Device KIT    Sig: Use glucometer to test sugars once daily and as  needed. Dx E11.9    Dispense:  1 kit    Refill:  0  . glucose blood (ONETOUCH ULTRA) test strip    Sig: Use to check sugar daily and as needed.  Dx Code: E11.9    Dispense:  100 each    Refill:  5     Penni Homans, MD

## 2020-02-07 NOTE — Assessment & Plan Note (Addendum)
hgba1c acceptable, minimize simple carbs. Increase exercise as tolerated. Continue current meds. Is given glucometer, lancets and test strips to check sugars at least twice a week once fasting and once after largest meals and as needed. Can check daily and as needed if willing

## 2020-07-14 ENCOUNTER — Encounter: Payer: Self-pay | Admitting: *Deleted

## 2020-08-15 ENCOUNTER — Other Ambulatory Visit: Payer: Self-pay

## 2020-08-15 ENCOUNTER — Ambulatory Visit (INDEPENDENT_AMBULATORY_CARE_PROVIDER_SITE_OTHER): Payer: Medicare Other | Admitting: Family Medicine

## 2020-08-15 VITALS — BP 116/57 | HR 65 | Temp 97.9°F | Resp 16 | Ht 68.0 in | Wt 144.4 lb

## 2020-08-15 DIAGNOSIS — M810 Age-related osteoporosis without current pathological fracture: Secondary | ICD-10-CM

## 2020-08-15 DIAGNOSIS — I10 Essential (primary) hypertension: Secondary | ICD-10-CM | POA: Diagnosis not present

## 2020-08-15 DIAGNOSIS — Z87438 Personal history of other diseases of male genital organs: Secondary | ICD-10-CM

## 2020-08-15 DIAGNOSIS — Z86010 Personal history of colonic polyps: Secondary | ICD-10-CM | POA: Diagnosis not present

## 2020-08-15 DIAGNOSIS — E782 Mixed hyperlipidemia: Secondary | ICD-10-CM

## 2020-08-15 DIAGNOSIS — E119 Type 2 diabetes mellitus without complications: Secondary | ICD-10-CM | POA: Diagnosis not present

## 2020-08-15 DIAGNOSIS — Z0000 Encounter for general adult medical examination without abnormal findings: Secondary | ICD-10-CM | POA: Diagnosis not present

## 2020-08-15 NOTE — Assessment & Plan Note (Signed)
He continues to decline referral for colonoscopy and we discussed at length through an interpretor that he is at the upper limit

## 2020-08-15 NOTE — Patient Instructions (Signed)

## 2020-08-15 NOTE — Assessment & Plan Note (Addendum)
Patient encouraged to maintain heart healthy diet, regular exercise, adequate sleep. Consider daily probiotics. Take medications as prescribed. Labs ordered and reviewed. He continues to decline referral to gastroenterology despite being about to age out of screening colonoscopies. He declines Shingrix shots. He is due for tetanus next July but he is advised to take it early if he gets injured. He agrees to [employer]

## 2020-08-15 NOTE — Assessment & Plan Note (Signed)
Well controlled, no changes to meds. Encouraged heart healthy diet such as the DASH diet and exercise as tolerated.  °

## 2020-08-16 LAB — LIPID PANEL
Cholesterol: 122 mg/dL (ref <200)
HDL: 41 mg/dL (ref >=40)
LDL Cholesterol (Calc): 61 mg/dL (calc)
Non-HDL Cholesterol (Calc): 81 mg/dL (calc) (ref <130)
Total CHOL/HDL Ratio: 3 (calc) (ref <5.0)
Triglycerides: 114 mg/dL (ref <150)

## 2020-08-16 LAB — COMPREHENSIVE METABOLIC PANEL
AG Ratio: 1.5 (calc) (ref 1.0–2.5)
ALT: 34 U/L (ref 9–46)
AST: 25 U/L (ref 10–35)
Albumin: 4.4 g/dL (ref 3.6–5.1)
Alkaline phosphatase (APISO): 92 U/L (ref 35–144)
BUN: 14 mg/dL (ref 7–25)
CO2: 26 mmol/L (ref 20–32)
Calcium: 9.1 mg/dL (ref 8.6–10.3)
Chloride: 107 mmol/L (ref 98–110)
Creat: 0.87 mg/dL (ref 0.70–1.18)
Globulin: 3 g/dL (calc) (ref 1.9–3.7)
Glucose, Bld: 133 mg/dL — ABNORMAL HIGH (ref 65–99)
Potassium: 4.2 mmol/L (ref 3.5–5.3)
Sodium: 140 mmol/L (ref 135–146)
Total Bilirubin: 1 mg/dL (ref 0.2–1.2)
Total Protein: 7.4 g/dL (ref 6.1–8.1)

## 2020-08-16 LAB — TSH: TSH: 2.15 mIU/L (ref 0.40–4.50)

## 2020-08-16 LAB — CBC
HCT: 37.9 % — ABNORMAL LOW (ref 38.5–50.0)
Hemoglobin: 13.1 g/dL — ABNORMAL LOW (ref 13.2–17.1)
MCH: 34.2 pg — ABNORMAL HIGH (ref 27.0–33.0)
MCHC: 34.6 g/dL (ref 32.0–36.0)
MCV: 99 fL (ref 80.0–100.0)
MPV: 10.2 fL (ref 7.5–12.5)
Platelets: 169 10*3/uL (ref 140–400)
RBC: 3.83 10*6/uL — ABNORMAL LOW (ref 4.20–5.80)
RDW: 12 % (ref 11.0–15.0)
WBC: 6.4 10*3/uL (ref 3.8–10.8)

## 2020-08-16 LAB — PSA: PSA: 3.25 ng/mL (ref <=4.0)

## 2020-08-16 LAB — HEMOGLOBIN A1C
Hgb A1c MFr Bld: 7.2 % of total Hgb — ABNORMAL HIGH (ref <5.7)
Mean Plasma Glucose: 160 (calc)
eAG (mmol/L): 8.9 (calc)

## 2020-08-16 LAB — VITAMIN D 25 HYDROXY (VIT D DEFICIENCY, FRACTURES): Vit D, 25-Hydroxy: 44 ng/mL (ref 30–100)

## 2020-08-18 NOTE — Assessment & Plan Note (Signed)
Encouraged heart healthy diet, increase exercise, avoid trans fats, consider a krill oil cap daily. Tolerating Atorvastatin 

## 2020-08-18 NOTE — Assessment & Plan Note (Signed)
hgba1c acceptable, minimize simple carbs. Increase exercise as tolerated. Continue current meds 

## 2020-08-18 NOTE — Progress Notes (Signed)
Patient ID: Marvin Mcdonald, male   DOB: [birth year], 79 y.o.   MRN: [id number]   Subjective:    Patient ID: Marvin Mcdonald, male    DOB: [birth year], 79 y.o.   MRN: [id number]  No chief complaint on file.   HPI Patient is in today for annual preventative exam and follow up on chronic medical concerns. No recent febrile illness or hospitalizations. He stays active and exercises regularly. He eats well and denies any recent concerns. History was obtained via interpretor. He continues to take care of his wife at home without assistance and he feels he is managing well. Denies CP/palp/SOB/HA/congestion/fevers/GI or GU c/o. Taking meds as prescribed  Past Medical History:  Diagnosis Date  . Acute URI 01/03/2013  . BPH (benign prostatic hypertrophy)   . Colon cancer screening 04/01/2017  . Colon polyps   . Dental infection 10/07/2013  . Diabetes mellitus    diet and exercise controlled  . Hematuria 04/07/2013  . Hyperlipidemia   . Low back pain   . Mass of neck 10/07/2013  . Osteoporosis   . Pain in joint, shoulder region 10/07/2013    Past Surgical History:  Procedure Laterality Date  . COLONOSCOPY    . HAND SURGERY    . POLYPECTOMY      Family History  Problem Relation Age of Onset  . Hypertension Sister   . Hypertension Brother   . Depression Paternal Grandmother   . Hypertension Brother   . Hypertension Brother   . Colon cancer Neg Hx   . Stomach cancer Neg Hx   . Esophageal cancer Neg Hx     Social History   Socioeconomic History  . Marital status: Married    Spouse name: Young  . Number of children: Not on file  . Years of education: Not on file  . Highest education level: Not on file  Occupational History    Employer: [employer]   Tobacco Use  . Smoking status: Former Smoker    Packs/day: 0.50    Years: 25.00    Pack years: 12.50    Types: Cigarettes    Quit date: 07/27/1996    Years since quitting: 24.0  . Smokeless tobacco: Never Used  Substance and  Sexual Activity  . Alcohol use: No    Alcohol/week: 0.0 standard drinks  . Drug use: No  . Sexual activity: Not on file  Other Topics Concern  . Not on file  Social History Narrative   Supportive daughter   Social Determinants of Health   Financial Resource Strain:   . Difficulty of Paying Living Expenses: Not on file  Food Insecurity:   . Worried About Charity fundraiser in the Last Year: Not on file  . Ran Out of Food in the Last Year: Not on file  Transportation Needs:   . Lack of Transportation (Medical): Not on file  . Lack of Transportation (Non-Medical): Not on file  Physical Activity:   . Days of Exercise per Week: Not on file  . Minutes of Exercise per Session: Not on file  Stress:   . Feeling of Stress : Not on file  Social Connections:   . Frequency of Communication with Friends and Family: Not on file  . Frequency of Social Gatherings with Friends and Family: Not on file  . Attends Religious Services: Not on file  . Active Member of Clubs or Organizations: Not on file  . Attends Archivist Meetings: Not on file  .  Marital Status: Not on file  Intimate Partner Violence:   . Fear of Current or Ex-Partner: Not on file  . Emotionally Abused: Not on file  . Physically Abused: Not on file  . Sexually Abused: Not on file    Outpatient Medications Prior to Visit  Medication Sig Dispense Refill  . aspirin 81 MG tablet Take 81 mg by mouth daily.      Marland Kitchen atorvastatin (LIPITOR) 40 MG tablet TAKE 1 TABLET(40 MG) BY MOUTH DAILY. STOP SIMVASTATIN 90 tablet 1  . Blood Glucose Monitoring Suppl (ONE TOUCH ULTRA 2) w/Device KIT Use glucometer to test sugars once daily and as needed. Dx E11.9 1 kit 0  . Calcium Carbonate (CALCIUM 500 PO) Take by mouth 2 (two) times daily.      . Ginger, Zingiber officinalis, (GINGER ROOT PO) Take by mouth.      Marland Kitchen glucose blood (ONETOUCH ULTRA) test strip Use to check sugar daily and as needed.  Dx Code: E11.9 100 each 5  . Multiple  Vitamin (MULTIVITAMIN) capsule Take 1 capsule by mouth daily.      Glory Rosebush Delica Lancets 09G MISC Use as directed once daily and as needed.  Dx Code: E11.9 100 each 5  . vitamin C (ASCORBIC ACID) 500 MG tablet Take 1,000 mg by mouth 2 (two) times daily.      No facility-administered medications prior to visit.    No Known Allergies  Review of Systems  Constitutional: Negative for chills, fever and malaise/fatigue.  HENT: Negative for congestion and hearing loss.   Eyes: Negative for discharge.  Respiratory: Negative for cough, sputum production and shortness of breath.   Cardiovascular: Negative for chest pain, palpitations and leg swelling.  Gastrointestinal: Negative for abdominal pain, blood in stool, constipation, diarrhea, heartburn, nausea and vomiting.  Genitourinary: Negative for dysuria, frequency, hematuria and urgency.  Musculoskeletal: Negative for back pain, falls and myalgias.  Skin: Negative for rash.  Neurological: Negative for dizziness, sensory change, loss of consciousness, weakness and headaches.  Endo/Heme/Allergies: Negative for environmental allergies. Does not bruise/bleed easily.  Psychiatric/Behavioral: Negative for depression and suicidal ideas. The patient is not nervous/anxious and does not have insomnia.        Objective:    Physical Exam Vitals and nursing note reviewed.  Constitutional:      General: He is not in acute distress.    Appearance: He is well-developed.  HENT:     Head: Normocephalic and atraumatic.     Right Ear: Tympanic membrane, ear canal and external ear normal.     Left Ear: Tympanic membrane, ear canal and external ear normal.     Nose: Nose normal.  Eyes:     General:        Right eye: No discharge.        Left eye: No discharge.  Cardiovascular:     Rate and Rhythm: Normal rate and regular rhythm.     Heart sounds: No murmur heard.   Pulmonary:     Effort: Pulmonary effort is normal.     Breath sounds: Normal  breath sounds. No wheezing.  Abdominal:     General: Bowel sounds are normal.     Palpations: Abdomen is soft. There is no mass.     Tenderness: There is no abdominal tenderness. There is no guarding.  Musculoskeletal:     Cervical back: Normal range of motion and neck supple.     Right lower leg: No edema.     Left lower  leg: No edema.  Skin:    General: Skin is warm and dry.  Neurological:     Mental Status: He is alert and oriented to person, place, and time.     Coordination: Coordination normal.     BP (!) 116/57 (BP Location: Left Arm, Patient Position: Sitting, Cuff Size: Small)   Pulse 65   Temp 97.9 F (36.6 C) (Oral)   Resp 16   Ht 5' 8"  (1.727 m)   Wt 144 lb 6.4 oz (65.5 kg)   SpO2 98%   BMI 21.96 kg/m  Wt Readings from Last 3 Encounters:  08/15/20 144 lb 6.4 oz (65.5 kg)  02/06/20 149 lb 3.2 oz (67.7 kg)  07/13/19 148 lb 4 oz (67.2 kg)    Diabetic Foot Exam - Simple   No data filed     Lab Results  Component Value Date   WBC 6.4 08/15/2020   HGB 13.1 (L) 08/15/2020   HCT 37.9 (L) 08/15/2020   PLT 169 08/15/2020   GLUCOSE 133 (H) 08/15/2020   CHOL 122 08/15/2020   TRIG 114 08/15/2020   HDL 41 08/15/2020   LDLDIRECT 68.0 02/06/2020   LDLCALC 61 08/15/2020   ALT 34 08/15/2020   AST 25 08/15/2020   NA 140 08/15/2020   K 4.2 08/15/2020   CL 107 08/15/2020   CREATININE 0.87 08/15/2020   BUN 14 08/15/2020   CO2 26 08/15/2020   TSH 2.15 08/15/2020   PSA 3.25 08/15/2020   HGBA1C 7.2 (H) 08/15/2020   MICROALBUR <0.7 12/21/2018    Lab Results  Component Value Date   TSH 2.15 08/15/2020   Lab Results  Component Value Date   WBC 6.4 08/15/2020   HGB 13.1 (L) 08/15/2020   HCT 37.9 (L) 08/15/2020   MCV 99.0 08/15/2020   PLT 169 08/15/2020   Lab Results  Component Value Date   NA 140 08/15/2020   K 4.2 08/15/2020   CO2 26 08/15/2020   GLUCOSE 133 (H) 08/15/2020   BUN 14 08/15/2020   CREATININE 0.87 08/15/2020   BILITOT 1.0 08/15/2020    ALKPHOS 89 02/06/2020   AST 25 08/15/2020   ALT 34 08/15/2020   PROT 7.4 08/15/2020   ALBUMIN 4.6 02/06/2020   CALCIUM 9.1 08/15/2020   GFR 81.41 02/06/2020   Lab Results  Component Value Date   CHOL 122 08/15/2020   Lab Results  Component Value Date   HDL 41 08/15/2020   Lab Results  Component Value Date   LDLCALC 61 08/15/2020   Lab Results  Component Value Date   TRIG 114 08/15/2020   Lab Results  Component Value Date   CHOLHDL 3.0 08/15/2020   Lab Results  Component Value Date   HGBA1C 7.2 (H) 08/15/2020       Assessment & Plan:   Problem List Items Addressed This Visit    Mixed hyperlipidemia - Primary    Encouraged heart healthy diet, increase exercise, avoid trans fats, consider a krill oil cap daily. Tolerating Atorvastatin      Relevant Orders   Lipid panel (Completed)   Essential hypertension    Well controlled, no changes to meds. Encouraged heart healthy diet such as the DASH diet and exercise as tolerated.       Relevant Orders   CBC (Completed)   Comprehensive metabolic panel (Completed)   TSH (Completed)   History of BPH   Relevant Orders   PSA (Completed)   Osteoporosis    Declines dexa scan and  continues to stay active      Relevant Orders   VITAMIN D 25 Hydroxy (Vit-D Deficiency, Fractures) (Completed)   Diabetes type 2, controlled ([facility])    hgba1c acceptable, minimize simple carbs. Increase exercise as tolerated. Continue current meds      Relevant Orders   Hemoglobin A1c (Completed)   Hx of colonic polyp    He continues to decline referral for colonoscopy and we discussed at length through an interpretor that he is at the upper limit      Preventative health care    Patient encouraged to maintain heart healthy diet, regular exercise, adequate sleep. Consider daily probiotics. Take medications as prescribed. Labs ordered and reviewed. He continues to decline referral to gastroenterology despite being about to age out of  screening colonoscopies. He declines Shingrix shots. He is due for tetanus next July but he is advised to take it early if he gets injured. He agrees to [employer]         I am having Bertell Maria. [place] maintain his aspirin, Calcium Carbonate (CALCIUM 500 PO), multivitamin, vitamin C, (Ginger, Zingiber officinalis, (GINGER ROOT PO)), atorvastatin, OneTouch Delica Lancets 79K, ONE TOUCH ULTRA 2, and OneTouch Ultra.  No orders of the defined types were placed in this encounter.    Penni Homans, MD

## 2020-08-18 NOTE — Assessment & Plan Note (Signed)
Declines dexa scan and continues to stay active

## 2020-10-07 DIAGNOSIS — H40013 Open angle with borderline findings, low risk, bilateral: Secondary | ICD-10-CM | POA: Diagnosis not present

## 2020-10-07 DIAGNOSIS — H04123 Dry eye syndrome of bilateral lacrimal glands: Secondary | ICD-10-CM | POA: Diagnosis not present

## 2020-10-07 DIAGNOSIS — Z961 Presence of intraocular lens: Secondary | ICD-10-CM | POA: Diagnosis not present

## 2020-10-07 DIAGNOSIS — R7309 Other abnormal glucose: Secondary | ICD-10-CM | POA: Diagnosis not present

## 2020-10-07 LAB — HM DIABETES EYE EXAM

## 2020-10-13 ENCOUNTER — Other Ambulatory Visit: Payer: Self-pay | Admitting: Family Medicine

## 2021-02-27 ENCOUNTER — Ambulatory Visit (INDEPENDENT_AMBULATORY_CARE_PROVIDER_SITE_OTHER): Payer: Medicare Other | Admitting: Family Medicine

## 2021-02-27 ENCOUNTER — Encounter: Payer: Self-pay | Admitting: Family Medicine

## 2021-02-27 ENCOUNTER — Other Ambulatory Visit (HOSPITAL_BASED_OUTPATIENT_CLINIC_OR_DEPARTMENT_OTHER): Payer: Self-pay

## 2021-02-27 ENCOUNTER — Other Ambulatory Visit: Payer: Self-pay

## 2021-02-27 VITALS — BP 108/66 | HR 83 | Temp 97.8°F | Resp 16 | Wt 148.0 lb

## 2021-02-27 DIAGNOSIS — E782 Mixed hyperlipidemia: Secondary | ICD-10-CM | POA: Diagnosis not present

## 2021-02-27 DIAGNOSIS — E119 Type 2 diabetes mellitus without complications: Secondary | ICD-10-CM | POA: Diagnosis not present

## 2021-02-27 DIAGNOSIS — M810 Age-related osteoporosis without current pathological fracture: Secondary | ICD-10-CM | POA: Diagnosis not present

## 2021-02-27 DIAGNOSIS — I10 Essential (primary) hypertension: Secondary | ICD-10-CM

## 2021-02-27 LAB — CBC
HCT: 38.8 % — ABNORMAL LOW (ref 39.0–52.0)
Hemoglobin: 13.4 g/dL (ref 13.0–17.0)
MCHC: 34.5 g/dL (ref 30.0–36.0)
MCV: 97.4 fl (ref 78.0–100.0)
Platelets: 181 10*3/uL (ref 150.0–400.0)
RBC: 3.98 Mil/uL — ABNORMAL LOW (ref 4.22–5.81)
RDW: 12.6 % (ref 11.5–15.5)
WBC: 7.1 10*3/uL (ref 4.0–10.5)

## 2021-02-27 LAB — MICROALBUMIN / CREATININE URINE RATIO
Creatinine,U: 190.9 mg/dL
Microalb Creat Ratio: 0.8 mg/g (ref 0.0–30.0)
Microalb, Ur: 1.6 mg/dL (ref 0.0–1.9)

## 2021-02-27 LAB — COMPREHENSIVE METABOLIC PANEL
ALT: 26 U/L (ref 0–53)
AST: 20 U/L (ref 0–37)
Albumin: 4.4 g/dL (ref 3.5–5.2)
Alkaline Phosphatase: 94 U/L (ref 39–117)
BUN: 13 mg/dL (ref 6–23)
CO2: 25 mEq/L (ref 19–32)
Calcium: 9.4 mg/dL (ref 8.4–10.5)
Chloride: 105 mEq/L (ref 96–112)
Creatinine, Ser: 0.94 mg/dL (ref 0.40–1.50)
GFR: 76.83 mL/min (ref >60.00)
Glucose, Bld: 163 mg/dL — ABNORMAL HIGH (ref 70–99)
Potassium: 4.2 mEq/L (ref 3.5–5.1)
Sodium: 139 mEq/L (ref 135–145)
Total Bilirubin: 1 mg/dL (ref 0.2–1.2)
Total Protein: 7.8 g/dL (ref 6.0–8.3)

## 2021-02-27 LAB — LIPID PANEL
Cholesterol: 137 mg/dL (ref 0–200)
HDL: 35.6 mg/dL — ABNORMAL LOW (ref >39.00)
LDL Cholesterol: 70 mg/dL (ref 0–99)
NonHDL: 101.48
Total CHOL/HDL Ratio: 4
Triglycerides: 158 mg/dL — ABNORMAL HIGH (ref 0.0–149.0)
VLDL: 31.6 mg/dL (ref 0.0–40.0)

## 2021-02-27 LAB — TSH: TSH: 1.61 u[IU]/mL (ref 0.35–4.50)

## 2021-02-27 LAB — HEMOGLOBIN A1C: Hgb A1c MFr Bld: 8.3 % — ABNORMAL HIGH (ref 4.6–6.5)

## 2021-02-27 MED ORDER — ZOSTER VAC RECOMB ADJUVANTED 50 MCG/0.5ML IM SUSR
0.5000 mL | Freq: Once | INTRAMUSCULAR | 0 refills | Status: DC
  Filled 2021-02-27: qty 0.5, 1d supply, fill #0

## 2021-02-27 NOTE — Patient Instructions (Signed)
Shingrix is the new shingles shot, 2 shots over 2-6 months, confirm coverage with insurance and document, then can return here for shots with nurse appt or at pharmacy Hypertension, Adult High blood pressure (hypertension) is when the force of blood pumping through the arteries is too strong. The arteries are the blood vessels that carry blood from the heart throughout the body. Hypertension forces the heart to work harder to pump blood and may cause arteries to become narrow or stiff. Untreated or uncontrolled hypertension can cause a heart attack, heart failure, a stroke, kidney disease, and other problems. A blood pressure reading consists of a higher number over a lower number. Ideally, your blood pressure should be below 120/80. The first ("top") number is called the systolic pressure. It is a measure of the pressure in your arteries as your heart beats. The second ("bottom") number is called the diastolic pressure. It is a measure of the pressure in your arteries as the heart relaxes. What are the causes? The exact cause of this condition is not known. There are some conditions that result in or are related to high blood pressure. What increases the risk? Some risk factors for high blood pressure are under your control. The following factors may make you more likely to develop this condition:  Smoking.  Having type 2 diabetes mellitus, high cholesterol, or both.  Not getting enough exercise or physical activity.  Being overweight.  Having too much fat, sugar, calories, or salt (sodium) in your diet.  Drinking too much alcohol. Some risk factors for high blood pressure may be difficult or impossible to change. Some of these factors include:  Having chronic kidney disease.  Having a family history of high blood pressure.  Age. Risk increases with age.  Race. You may be at higher risk if you are African American.  Gender. Men are at higher risk than women before age 43. After age 42,  women are at higher risk than men.  Having obstructive sleep apnea.  Stress. What are the signs or symptoms? High blood pressure may not cause symptoms. Very high blood pressure (hypertensive crisis) may cause:  Headache.  Anxiety.  Shortness of breath.  Nosebleed.  Nausea and vomiting.  Vision changes.  Severe chest pain.  Seizures. How is this diagnosed? This condition is diagnosed by measuring your blood pressure while you are seated, with your arm resting on a flat surface, your legs uncrossed, and your feet flat on the floor. The cuff of the blood pressure monitor will be placed directly against the skin of your upper arm at the level of your heart. It should be measured at least twice using the same arm. Certain conditions can cause a difference in blood pressure between your right and left arms. Certain factors can cause blood pressure readings to be lower or higher than normal for a short period of time:  When your blood pressure is higher when you are in a health care provider's office than when you are at home, this is called white coat hypertension. Most people with this condition do not need medicines.  When your blood pressure is higher at home than when you are in a health care provider's office, this is called masked hypertension. Most people with this condition may need medicines to control blood pressure. If you have a high blood pressure reading during one visit or you have normal blood pressure with other risk factors, you may be asked to:  Return on a different day to have  your blood pressure checked again.  Monitor your blood pressure at home for 1 week or longer. If you are diagnosed with hypertension, you may have other blood or imaging tests to help your health care provider understand your overall risk for other conditions. How is this treated? This condition is treated by making healthy lifestyle changes, such as eating healthy foods, exercising more,  and reducing your alcohol intake. Your health care provider may prescribe medicine if lifestyle changes are not enough to get your blood pressure under control, and if:  Your systolic blood pressure is above 130.  Your diastolic blood pressure is above 80. Your personal target blood pressure may vary depending on your medical conditions, your age, and other factors. Follow these instructions at home: Eating and drinking  Eat a diet that is high in fiber and potassium, and low in sodium, added sugar, and fat. An example eating plan is called the DASH (Dietary Approaches to Stop Hypertension) diet. To eat this way: ? Eat plenty of fresh fruits and vegetables. Try to fill one half of your plate at each meal with fruits and vegetables. ? Eat whole grains, such as whole-wheat pasta, brown rice, or whole-grain bread. Fill about one fourth of your plate with whole grains. ? Eat or drink low-fat dairy products, such as skim milk or low-fat yogurt. ? Avoid fatty cuts of meat, processed or cured meats, and poultry with skin. Fill about one fourth of your plate with lean proteins, such as fish, chicken without skin, beans, eggs, or tofu. ? Avoid pre-made and processed foods. These tend to be higher in sodium, added sugar, and fat.  Reduce your daily sodium intake. Most people with hypertension should eat less than 1,500 mg of sodium a day.  Do not drink alcohol if: ? Your health care provider tells you not to drink. ? You are pregnant, may be pregnant, or are planning to become pregnant.  If you drink alcohol: ? Limit how much you use to:  0-1 drink a day for women.  0-2 drinks a day for men. ? Be aware of how much alcohol is in your drink. In the [place] one drink equals one 12 oz bottle of beer (355 mL), one 5 oz glass of wine (148 mL), or one 1 oz glass of hard liquor (44 mL).   Lifestyle  Work with your health care provider to maintain a healthy body weight or to lose weight. Ask what an ideal  weight is for you.  Get at least 30 minutes of exercise most days of the week. Activities may include walking, swimming, or biking.  Include exercise to strengthen your muscles (resistance exercise), such as Pilates or lifting weights, as part of your weekly exercise routine. Try to do these types of exercises for 30 minutes at least 3 days a week.  Do not use any products that contain nicotine or tobacco, such as cigarettes, e-cigarettes, and chewing tobacco. If you need help quitting, ask your health care provider.  Monitor your blood pressure at home as told by your health care provider.  Keep all follow-up visits as told by your health care provider. This is important.   Medicines  Take over-the-counter and prescription medicines only as told by your health care provider. Follow directions carefully. Blood pressure medicines must be taken as prescribed.  Do not skip doses of blood pressure medicine. Doing this puts you at risk for problems and can make the medicine less effective.  Ask your health care  provider about side effects or reactions to medicines that you should watch for. Contact a health care provider if you:  Think you are having a reaction to a medicine you are taking.  Have headaches that keep coming back (recurring).  Feel dizzy.  Have swelling in your ankles.  Have trouble with your vision. Get help right away if you:  Develop a severe headache or confusion.  Have unusual weakness or numbness.  Feel faint.  Have severe pain in your chest or abdomen.  Vomit repeatedly.  Have trouble breathing. Summary  Hypertension is when the force of blood pumping through your arteries is too strong. If this condition is not controlled, it may put you at risk for serious complications.  Your personal target blood pressure may vary depending on your medical conditions, your age, and other factors. For most people, a normal blood pressure is less than  120/80.  Hypertension is treated with lifestyle changes, medicines, or a combination of both. Lifestyle changes include losing weight, eating a healthy, low-sodium diet, exercising more, and limiting alcohol. This information is not intended to replace advice given to you by your health care provider. Make sure you discuss any questions you have with your health care provider. Document Revised: 06/29/2018 Document Reviewed: 06/29/2018 Elsevier Patient Education  2021 [employer].

## 2021-02-28 ENCOUNTER — Other Ambulatory Visit: Payer: Self-pay

## 2021-02-28 MED ORDER — METFORMIN HCL 500 MG PO TABS: 500.0000 mg | ORAL_TABLET | Freq: Every day | ORAL | 3 refills | Status: DC

## 2021-03-01 NOTE — Assessment & Plan Note (Signed)
Tolerating statin, encouraged heart healthy diet, avoid trans fats, minimize simple carbs and saturated fats. Increase exercise as tolerated 

## 2021-03-01 NOTE — Assessment & Plan Note (Signed)
Well controlled, no changes to meds. Encouraged heart healthy diet such as the DASH diet and exercise as tolerated.  °

## 2021-03-01 NOTE — Progress Notes (Signed)
Subjective:    Patient ID: Marvin Mcdonald, male    DOB: 1941/05/23, 80 y.o.   MRN: [id number]  Chief Complaint  Patient presents with  . Follow-up  . Diabetes    HPI Patient is in today for follow up on chronic medical concerns including diabetes and hyperlipidemia. He is accompanied by his wife who is struggling with worsening dementia. History is obtained via an interpretor. Patient continues to exercise regularly and especially enjoys playing tennis. He has put locks on all the doors and is able to supervise his wife most of the time. He has family around if he needs them. Patient reports he eats well. Denies CP/palp/SOB/HA/congestion/fevers/GI or GU c/o. Taking meds as prescribed  Past Medical History:  Diagnosis Date  . Acute URI 01/03/2013  . BPH (benign prostatic hypertrophy)   . Colon cancer screening 04/01/2017  . Colon polyps   . Dental infection 10/07/2013  . Diabetes mellitus    diet and exercise controlled  . Hematuria 04/07/2013  . Hyperlipidemia   . Low back pain   . Mass of neck 10/07/2013  . Osteoporosis   . Pain in joint, shoulder region 10/07/2013    Past Surgical History:  Procedure Laterality Date  . COLONOSCOPY    . HAND SURGERY    . POLYPECTOMY      Family History  Problem Relation Age of Onset  . Hypertension Sister   . Hypertension Brother   . Depression Paternal Grandmother   . Hypertension Brother   . Hypertension Brother   . Colon cancer Neg Hx   . Stomach cancer Neg Hx   . Esophageal cancer Neg Hx     Social History   Socioeconomic History  . Marital status: Married    Spouse name: Young  . Number of children: Not on file  . Years of education: Not on file  . Highest education level: Not on file  Occupational History    Employer: [employer]   Tobacco Use  . Smoking status: Former Smoker    Packs/day: 0.50    Years: 25.00    Pack years: 12.50    Types: Cigarettes    Quit date: 07/27/1996    Years since quitting: 24.6  .  Smokeless tobacco: Never Used  Substance and Sexual Activity  . Alcohol use: No    Alcohol/week: 0.0 standard drinks  . Drug use: No  . Sexual activity: Not on file  Other Topics Concern  . Not on file  Social History Narrative   Supportive daughter   Social Determinants of Health   Financial Resource Strain: Not on file  Food Insecurity: Not on file  Transportation Needs: Not on file  Physical Activity: Not on file  Stress: Not on file  Social Connections: Not on file  Intimate Partner Violence: Not on file    Outpatient Medications Prior to Visit  Medication Sig Dispense Refill  . aspirin 81 MG tablet Take 81 mg by mouth daily.    Marland Kitchen atorvastatin (LIPITOR) 40 MG tablet TAKE 1 TABLET(40 MG) BY MOUTH DAILY. STOP SIMVASTATIN 90 tablet 1  . Blood Glucose Monitoring Suppl (ONE TOUCH ULTRA 2) w/Device KIT Use glucometer to test sugars once daily and as needed. Dx E11.9 1 kit 0  . Calcium Carbonate (CALCIUM 500 PO) Take by mouth 2 (two) times daily.    . Ginger, Zingiber officinalis, (GINGER ROOT PO) Take by mouth.    Marland Kitchen glucose blood (ONETOUCH ULTRA) test strip Use to check sugar daily  and as needed.  Dx Code: E11.9 100 each 5  . Multiple Vitamin (MULTIVITAMIN) capsule Take 1 capsule by mouth daily.    Glory Rosebush Delica Lancets 72Z MISC Use as directed once daily and as needed.  Dx Code: E11.9 100 each 5  . vitamin C (ASCORBIC ACID) 500 MG tablet Take 1,000 mg by mouth 2 (two) times daily.     No facility-administered medications prior to visit.    No Known Allergies  Review of Systems  Constitutional: Negative for fever and malaise/fatigue.  HENT: Negative for congestion.   Eyes: Negative for blurred vision.  Respiratory: Negative for shortness of breath.   Cardiovascular: Negative for chest pain, palpitations and leg swelling.  Gastrointestinal: Negative for abdominal pain, blood in stool and nausea.  Genitourinary: Negative for dysuria and frequency.  Musculoskeletal:  Negative for falls.  Skin: Negative for rash.  Neurological: Negative for dizziness, loss of consciousness and headaches.  Endo/Heme/Allergies: Negative for environmental allergies.  Psychiatric/Behavioral: Negative for depression. The patient is not nervous/anxious.        Objective:    Physical Exam Vitals and nursing note reviewed.  Constitutional:      General: He is not in acute distress.    Appearance: He is well-developed.  HENT:     Head: Normocephalic and atraumatic.     Nose: Nose normal.  Eyes:     General:        Right eye: No discharge.        Left eye: No discharge.  Cardiovascular:     Rate and Rhythm: Normal rate and regular rhythm.     Heart sounds: No murmur heard.   Pulmonary:     Effort: Pulmonary effort is normal.     Breath sounds: Normal breath sounds.  Abdominal:     General: Bowel sounds are normal.     Palpations: Abdomen is soft.     Tenderness: There is no abdominal tenderness.  Musculoskeletal:     Cervical back: Normal range of motion and neck supple.  Skin:    General: Skin is warm and dry.  Neurological:     Mental Status: He is alert and oriented to person, place, and time.     BP 108/66   Pulse 83   Temp 97.8 F (36.6 C)   Resp 16   Wt 148 lb (67.1 kg)   SpO2 95%   BMI 22.50 kg/m  Wt Readings from Last 3 Encounters:  02/27/21 148 lb (67.1 kg)  08/15/20 144 lb 6.4 oz (65.5 kg)  02/06/20 149 lb 3.2 oz (67.7 kg)    Diabetic Foot Exam - Simple   Simple Foot Form Visual Inspection No deformities, no ulcerations, no other skin breakdown bilaterally: Yes Sensation Testing Intact to touch and monofilament testing bilaterally: Yes Pulse Check Posterior Tibialis and Dorsalis pulse intact bilaterally: Yes Comments    Lab Results  Component Value Date   WBC 7.1 02/27/2021   HGB 13.4 02/27/2021   HCT 38.8 (L) 02/27/2021   PLT 181.0 02/27/2021   GLUCOSE 163 (H) 02/27/2021   CHOL 137 02/27/2021   TRIG 158.0 (H) 02/27/2021    HDL 35.60 (L) 02/27/2021   LDLDIRECT 68.0 02/06/2020   LDLCALC 70 02/27/2021   ALT 26 02/27/2021   AST 20 02/27/2021   NA 139 02/27/2021   K 4.2 02/27/2021   CL 105 02/27/2021   CREATININE 0.94 02/27/2021   BUN 13 02/27/2021   CO2 25 02/27/2021   TSH 1.61 02/27/2021  PSA 3.25 08/15/2020   HGBA1C 8.3 (H) 02/27/2021   MICROALBUR 1.6 02/27/2021    Lab Results  Component Value Date   TSH 1.61 02/27/2021   Lab Results  Component Value Date   WBC 7.1 02/27/2021   HGB 13.4 02/27/2021   HCT 38.8 (L) 02/27/2021   MCV 97.4 02/27/2021   PLT 181.0 02/27/2021   Lab Results  Component Value Date   NA 139 02/27/2021   K 4.2 02/27/2021   CO2 25 02/27/2021   GLUCOSE 163 (H) 02/27/2021   BUN 13 02/27/2021   CREATININE 0.94 02/27/2021   BILITOT 1.0 02/27/2021   ALKPHOS 94 02/27/2021   AST 20 02/27/2021   ALT 26 02/27/2021   PROT 7.8 02/27/2021   ALBUMIN 4.4 02/27/2021   CALCIUM 9.4 02/27/2021   GFR 76.83 02/27/2021   Lab Results  Component Value Date   CHOL 137 02/27/2021   Lab Results  Component Value Date   HDL 35.60 (L) 02/27/2021   Lab Results  Component Value Date   LDLCALC 70 02/27/2021   Lab Results  Component Value Date   TRIG 158.0 (H) 02/27/2021   Lab Results  Component Value Date   CHOLHDL 4 02/27/2021   Lab Results  Component Value Date   HGBA1C 8.3 (H) 02/27/2021       Assessment & Plan:   Problem List Items Addressed This Visit    Mixed hyperlipidemia - Primary    Tolerating statin, encouraged heart healthy diet, avoid trans fats, minimize simple carbs and saturated fats. Increase exercise as tolerated      Relevant Orders   Lipid panel (Completed)   Essential hypertension    Well controlled, no changes to meds. Encouraged heart healthy diet such as the DASH diet and exercise as tolerated.       Relevant Orders   CBC (Completed)   Comprehensive metabolic panel (Completed)   TSH (Completed)   Osteoporosis    Encouraged to get  adequate exercise, calcium and vitamin d intake. He declines dexa scan      Diabetes type 2, controlled ([place])    hgba1c acceptable at last draw but has climbed up start Metformin 500 mg po daily, minimize simple carbs. Increase exercise as tolerated. Continue current meds      Relevant Orders   Hemoglobin A1c (Completed)   Microalbumin / creatinine urine ratio (Completed)      I am having Bertell Maria. [place] maintain his aspirin, Calcium Carbonate (CALCIUM 500 PO), multivitamin, vitamin C, (Ginger, Zingiber officinalis, (GINGER ROOT PO)), OneTouch Delica Lancets 13S, ONE TOUCH ULTRA 2, OneTouch Ultra, and atorvastatin.  No orders of the defined types were placed in this encounter.    Penni Homans, MD

## 2021-03-01 NOTE — Assessment & Plan Note (Signed)
Encouraged to get adequate exercise, calcium and vitamin d intake. He declines dexa scan

## 2021-03-01 NOTE — Assessment & Plan Note (Addendum)
hgba1c acceptable at last draw but has climbed up start Metformin 500 mg po daily, minimize simple carbs. Increase exercise as tolerated. Continue current meds

## 2021-04-29 ENCOUNTER — Other Ambulatory Visit (HOSPITAL_BASED_OUTPATIENT_CLINIC_OR_DEPARTMENT_OTHER): Payer: Self-pay

## 2021-06-13 ENCOUNTER — Encounter: Payer: Self-pay | Admitting: *Deleted

## 2021-06-19 ENCOUNTER — Other Ambulatory Visit: Payer: Self-pay | Admitting: Family Medicine

## 2021-07-17 ENCOUNTER — Other Ambulatory Visit: Payer: Self-pay | Admitting: Family Medicine

## 2021-07-17 DIAGNOSIS — H9193 Unspecified hearing loss, bilateral: Secondary | ICD-10-CM

## 2021-08-06 DIAGNOSIS — H903 Sensorineural hearing loss, bilateral: Secondary | ICD-10-CM | POA: Diagnosis not present

## 2021-09-20 ENCOUNTER — Other Ambulatory Visit: Payer: Self-pay | Admitting: Family Medicine

## 2021-10-14 DIAGNOSIS — H04123 Dry eye syndrome of bilateral lacrimal glands: Secondary | ICD-10-CM | POA: Diagnosis not present

## 2021-10-14 DIAGNOSIS — H40013 Open angle with borderline findings, low risk, bilateral: Secondary | ICD-10-CM | POA: Diagnosis not present

## 2021-10-14 DIAGNOSIS — R7309 Other abnormal glucose: Secondary | ICD-10-CM | POA: Diagnosis not present

## 2021-10-14 DIAGNOSIS — H43391 Other vitreous opacities, right eye: Secondary | ICD-10-CM | POA: Diagnosis not present

## 2022-01-09 DIAGNOSIS — E1165 Type 2 diabetes mellitus with hyperglycemia: Secondary | ICD-10-CM | POA: Diagnosis not present

## 2022-01-09 DIAGNOSIS — R0602 Shortness of breath: Secondary | ICD-10-CM | POA: Diagnosis not present

## 2022-01-09 DIAGNOSIS — E559 Vitamin D deficiency, unspecified: Secondary | ICD-10-CM | POA: Diagnosis not present

## 2022-01-09 DIAGNOSIS — Z0000 Encounter for general adult medical examination without abnormal findings: Secondary | ICD-10-CM | POA: Diagnosis not present

## 2022-01-09 DIAGNOSIS — R5383 Other fatigue: Secondary | ICD-10-CM | POA: Diagnosis not present

## 2022-02-14 ENCOUNTER — Other Ambulatory Visit: Payer: Self-pay | Admitting: Family Medicine

## 2022-02-16 ENCOUNTER — Other Ambulatory Visit: Payer: Self-pay | Admitting: Family Medicine

## 2022-03-13 ENCOUNTER — Ambulatory Visit: Payer: Medicare Other | Admitting: Family

## 2022-03-13 ENCOUNTER — Ambulatory Visit (INDEPENDENT_AMBULATORY_CARE_PROVIDER_SITE_OTHER): Payer: Medicare Other | Admitting: Family

## 2022-03-13 VITALS — BP 117/66 | HR 90 | Temp 98.1°F | Resp 16 | Wt 139.0 lb

## 2022-03-13 DIAGNOSIS — E782 Mixed hyperlipidemia: Secondary | ICD-10-CM | POA: Diagnosis not present

## 2022-03-13 DIAGNOSIS — F0390 Unspecified dementia without behavioral disturbance: Secondary | ICD-10-CM | POA: Diagnosis not present

## 2022-03-13 DIAGNOSIS — E119 Type 2 diabetes mellitus without complications: Secondary | ICD-10-CM | POA: Diagnosis not present

## 2022-03-13 DIAGNOSIS — R413 Other amnesia: Secondary | ICD-10-CM

## 2022-03-13 LAB — TSH: TSH: 0.9 u[IU]/mL (ref 0.35–5.50)

## 2022-03-13 LAB — LIPID PANEL
Cholesterol: 218 mg/dL — ABNORMAL HIGH (ref 0–200)
HDL: 38.5 mg/dL — ABNORMAL LOW (ref >39.00)
NonHDL: 179.78
Total CHOL/HDL Ratio: 6
Triglycerides: 222 mg/dL — ABNORMAL HIGH (ref 0.0–149.0)
VLDL: 44.4 mg/dL — ABNORMAL HIGH (ref 0.0–40.0)

## 2022-03-13 LAB — HEMOGLOBIN A1C: Hgb A1c MFr Bld: 7.9 % — ABNORMAL HIGH (ref 4.6–6.5)

## 2022-03-13 LAB — COMPREHENSIVE METABOLIC PANEL
ALT: 30 U/L (ref 0–53)
AST: 22 U/L (ref 0–37)
Albumin: 4.2 g/dL (ref 3.5–5.2)
Alkaline Phosphatase: 104 U/L (ref 39–117)
BUN: 16 mg/dL (ref 6–23)
CO2: 24 mEq/L (ref 19–32)
Calcium: 9 mg/dL (ref 8.4–10.5)
Chloride: 102 mEq/L (ref 96–112)
Creatinine, Ser: 1.01 mg/dL (ref 0.40–1.50)
GFR: 69.98 mL/min (ref >60.00)
Glucose, Bld: 284 mg/dL — ABNORMAL HIGH (ref 70–99)
Potassium: 4.3 mEq/L (ref 3.5–5.1)
Sodium: 135 mEq/L (ref 135–145)
Total Bilirubin: 0.7 mg/dL (ref 0.2–1.2)
Total Protein: 7.3 g/dL (ref 6.0–8.3)

## 2022-03-13 LAB — VITAMIN B12: Vitamin B-12: 352 pg/mL (ref 211–911)

## 2022-03-13 LAB — LDL CHOLESTEROL, DIRECT: Direct LDL: 145 mg/dL

## 2022-03-13 MED ORDER — ATORVASTATIN CALCIUM 40 MG PO TABS: ORAL_TABLET | ORAL | 1 refills | Status: DC

## 2022-03-13 MED ORDER — METFORMIN HCL 500 MG PO TABS: ORAL_TABLET | ORAL | 1 refills | Status: DC

## 2022-03-13 NOTE — Assessment & Plan Note (Signed)
Not taking statin. Resume atorvastatin. Recheck level.  ?

## 2022-03-13 NOTE — Progress Notes (Incomplete)
? ?Subjective:  ? ?By signing my name below, I, Carylon Perches, attest that this documentation has been prepared under the direction and in the presence of Glen Osborne, NP 03/13/2022   ? ? Patient ID: Marvin Mcdonald, male    DOB: 09/20/1941, 81 y.o.   MRN: [id number] ? ?No chief complaint on file. ? ? ?HPI ?Patient is in today for an office visit. He is with his daughter and fellow church member.  ? ?Refill - His daughter is requesting a refill on all of the patients medication since he lost them.  ? ?Dementia Symptoms -  His daughter complains of the patient having potential dementia symptoms/memory loss. She states that since her mother passed last year, the patient has been living alone. He becomes forgetful often and experiences short term memory loss. He will forget information after 10 minutes or the next day. His church member states that he is not keeping up with his daily home tasks. He is also forgetful of where things are located. His diet is also poor. He reports that he takes a bath about once a month. He tends to forget today's date. His church member reports that he can talk everyday. The patient states that because he lives alone, his memory is deteriorating. The church member  is offering for the patient to stay at her residence.  ? ? ?Health Maintenance Due  ?Topic Date Due  ? Zoster Vaccines- Shingrix (1 of 2) Never done  ? COLONOSCOPY (Pts 45-27yr Insurance coverage will need to be confirmed)  08/10/2016  ? FOOT EXAM  05/28/2017  ? COVID-19 Vaccine (3 - Booster for Pfizer series) 02/08/2020  ? TETANUS/TDAP  05/04/2021  ? HEMOGLOBIN A1C  08/29/2021  ? OPHTHALMOLOGY EXAM  10/07/2021  ? URINE MICROALBUMIN  02/27/2022  ? ? ?Past Medical History:  ?Diagnosis Date  ? Acute URI 01/03/2013  ? BPH (benign prostatic hypertrophy)   ? Colon cancer screening 04/01/2017  ? Colon polyps   ? Dental infection 10/07/2013  ? Diabetes mellitus   ? diet and exercise controlled  ? Hematuria 04/07/2013  ?  Hyperlipidemia   ? Low back pain   ? Mass of neck 10/07/2013  ? Osteoporosis   ? Pain in joint, shoulder region 10/07/2013  ? ? ?Past Surgical History:  ?Procedure Laterality Date  ? COLONOSCOPY    ? HAND SURGERY    ? POLYPECTOMY    ? ? ?Family History  ?Problem Relation Age of Onset  ? Hypertension Sister   ? Hypertension Brother   ? Depression Paternal Grandmother   ? Hypertension Brother   ? Hypertension Brother   ? Colon cancer Neg Hx   ? Stomach cancer Neg Hx   ? Esophageal cancer Neg Hx   ? ? ?Social History  ? ?Socioeconomic History  ? Marital status: Married  ?  Spouse name: Young  ? Number of children: Not on file  ? Years of education: Not on file  ? Highest education level: Not on file  ?Occupational History  ?  Employer: [employer]   ?Tobacco Use  ? Smoking status: Former  ?  Packs/day: 0.50  ?  Years: 25.00  ?  Pack years: 12.50  ?  Types: Cigarettes  ?  Quit date: 07/27/1996  ?  Years since quitting: 25.6  ? Smokeless tobacco: Never  ?Substance and Sexual Activity  ? Alcohol use: No  ?  Alcohol/week: 0.0 standard drinks  ? Drug use: No  ? Sexual activity: Not  on file  ?Other Topics Concern  ? Not on file  ?Social History Narrative  ? Supportive daughter  ? ?Social Determinants of Health  ? ?Financial Resource Strain: Not on file  ?Food Insecurity: Not on file  ?Transportation Needs: Not on file  ?Physical Activity: Not on file  ?Stress: Not on file  ?Social Connections: Not on file  ?Intimate Partner Violence: Not on file  ? ? ?Outpatient Medications Prior to Visit  ?Medication Sig Dispense Refill  ? aspirin 81 MG tablet Take 81 mg by mouth daily.    ? atorvastatin (LIPITOR) 40 MG tablet TAKE 1 TABLET(40 MG) BY MOUTH DAILY. STOP SIMVASTATIN 90 tablet 0  ? Blood Glucose Monitoring Suppl (ONE TOUCH ULTRA 2) w/Device KIT Use glucometer to test sugars once daily and as needed. Dx E11.9 1 kit 0  ? Calcium Carbonate (CALCIUM 500 PO) Take by mouth 2 (two) times daily.    ? Ginger, Zingiber officinalis,  (GINGER ROOT PO) Take by mouth.    ? glucose blood (ONETOUCH ULTRA) test strip Use to check sugar daily and as needed.  Dx Code: E11.9 100 each 5  ? metFORMIN (GLUCOPHAGE) 500 MG tablet TAKE 1 TABLET(500 MG) BY MOUTH DAILY WITH BREAKFAST 60 tablet 0  ? Multiple Vitamin (MULTIVITAMIN) capsule Take 1 capsule by mouth daily.    ? OneTouch Delica Lancets 56C MISC Use as directed once daily and as needed.  Dx Code: E11.9 100 each 5  ? vitamin C (ASCORBIC ACID) 500 MG tablet Take 1,000 mg by mouth 2 (two) times daily.    ? ?No facility-administered medications prior to visit.  ? ? ?No Known Allergies ? ?Review of Systems  ?Psychiatric/Behavioral:  Positive for memory loss.   ? ?   ?Objective:  ?  ?Physical Exam ?Constitutional:   ?   General: He is not in acute distress. ?   Appearance: Normal appearance. He is not ill-appearing.  ?HENT:  ?   Head: Normocephalic and atraumatic.  ?   Right Ear: External ear normal.  ?   Left Ear: External ear normal.  ?Eyes:  ?   Extraocular Movements: Extraocular movements intact.  ?   Pupils: Pupils are equal, round, and reactive to light.  ?Cardiovascular:  ?   Rate and Rhythm: Normal rate and regular rhythm.  ?   Heart sounds: Normal heart sounds. No murmur heard. ?  No gallop.  ?Pulmonary:  ?   Effort: Pulmonary effort is normal. No respiratory distress.  ?   Breath sounds: Normal breath sounds. No wheezing or rales.  ?Skin: ?   General: Skin is warm and dry.  ?Neurological:  ?   Mental Status: He is alert and oriented to person, place, and time.  ?Psychiatric:     ?   Mood and Affect: Mood normal.     ?   Behavior: Behavior normal.     ?   Judgment: Judgment normal.  ? ? ?There were no vitals taken for this visit. ?Wt Readings from Last 3 Encounters:  ?05/28/21 141 lb (64 kg)  ?02/27/21 148 lb (67.1 kg)  ?08/15/20 144 lb 6.4 oz (65.5 kg)  ? ? ?   ?Assessment & Plan:  ? ?Problem List Items Addressed This Visit   ?None ? ? ? ?No orders of the defined types were placed in this  encounter. ? ? ?I, Carylon Perches, personally preformed the services described in this documentation.  All medical record entries made by the scribe were at my direction  and in my presence.  I have reviewed the chart and discharge instructions (if applicable) and agree that the record reflects my personal performance and is accurate and complete. 03/13/2022 ? ? ?I,Amber Collins,acting as a Education administrator for [employer], NP.,have documented all relevant documentation on the behalf of Nance Pear, NP,as directed by  Nance Pear, NP while in the presence of Nance Pear, NP. ? ? ? ?[employer] ? ?

## 2022-03-13 NOTE — Assessment & Plan Note (Signed)
Has not been taking metformin. Friend will help him with his medications. Restart. Check follow up A1C.  ?

## 2022-03-13 NOTE — Progress Notes (Signed)
? ?Subjective:  ? ? ? Patient ID: Marvin Mcdonald, male    DOB: January 20, 1941, 81 y.o.   MRN: [id number] ? ?Chief Complaint  ?Patient presents with  ? Dementia  ?  Patient's daughter reports patient has dementia, can't remember things  ? ? ?HPI ?Patient presents today with his daughter and another male church member.  Church Member went to the patient's home to check on him. States that she could not find any medications in the house. In addition, she does not think he is eating properly. States he does not know the date and is having trouble keeping up with ADL's such as bathing and cooking.  ? ?Per daughter, the patient was the primary caregiver for his wife who died in 09-19-23 from Dementia complications. Since that time, his memory and ability to care for himself seemed to have declined significantly. They are interested in further evaluation of his memory loss.  ? ?Patient speaks only [place]. Daughter/friend interpret for today's visit.  ? ?Health Maintenance Due  ?Topic Date Due  ? Zoster Vaccines- Shingrix (1 of 2) Never done  ? COLONOSCOPY (Pts 45-38yr Insurance coverage will need to be confirmed)  08/10/2016  ? FOOT EXAM  05/28/2017  ? COVID-19 Vaccine (3 - Booster for Pfizer series) 02/08/2020  ? TETANUS/TDAP  05/04/2021  ? HEMOGLOBIN A1C  08/29/2021  ? OPHTHALMOLOGY EXAM  10/07/2021  ? URINE MICROALBUMIN  02/27/2022  ? ? ?Past Medical History:  ?Diagnosis Date  ? Acute URI 01/03/2013  ? BPH (benign prostatic hypertrophy)   ? Colon cancer screening 04/01/2017  ? Colon polyps   ? Dental infection 10/07/2013  ? Diabetes mellitus   ? diet and exercise controlled  ? Hematuria 04/07/2013  ? Hyperlipidemia   ? Low back pain   ? Mass of neck 10/07/2013  ? Osteoporosis   ? Pain in joint, shoulder region 10/07/2013  ? ? ?Past Surgical History:  ?Procedure Laterality Date  ? COLONOSCOPY    ? HAND SURGERY    ? POLYPECTOMY    ? ? ?Family History  ?Problem Relation Age of Onset  ? Hypertension Sister   ? Hypertension Brother    ? Depression Paternal Grandmother   ? Hypertension Brother   ? Hypertension Brother   ? Colon cancer Neg Hx   ? Stomach cancer Neg Hx   ? Esophageal cancer Neg Hx   ? ? ?Social History  ? ?Socioeconomic History  ? Marital status: Married  ?  Spouse name: Young  ? Number of children: Not on file  ? Years of education: Not on file  ? Highest education level: Not on file  ?Occupational History  ?  Employer: [employer]   ?Tobacco Use  ? Smoking status: Former  ?  Packs/day: 0.50  ?  Years: 25.00  ?  Pack years: 12.50  ?  Types: Cigarettes  ?  Quit date: 07/27/1996  ?  Years since quitting: 25.6  ? Smokeless tobacco: Never  ?Substance and Sexual Activity  ? Alcohol use: No  ?  Alcohol/week: 0.0 standard drinks  ? Drug use: No  ? Sexual activity: Not on file  ?Other Topics Concern  ? Not on file  ?Social History Narrative  ? Supportive daughter  ? ?Social Determinants of Health  ? ?Financial Resource Strain: Not on file  ?Food Insecurity: Not on file  ?Transportation Needs: Not on file  ?Physical Activity: Not on file  ?Stress: Not on file  ?Social Connections: Not on file  ?Intimate  Partner Violence: Not on file  ? ? ?Outpatient Medications Prior to Visit  ?Medication Sig Dispense Refill  ? aspirin 81 MG tablet Take 81 mg by mouth daily. (Patient not taking: Reported on 03/13/2022)    ? Blood Glucose Monitoring Suppl (ONE TOUCH ULTRA 2) w/Device KIT Use glucometer to test sugars once daily and as needed. Dx E11.9 (Patient not taking: Reported on 03/13/2022) 1 kit 0  ? Calcium Carbonate (CALCIUM 500 PO) Take by mouth 2 (two) times daily. (Patient not taking: Reported on 03/13/2022)    ? Ginger, Zingiber officinalis, (GINGER ROOT PO) Take by mouth. (Patient not taking: Reported on 03/13/2022)    ? glucose blood (ONETOUCH ULTRA) test strip Use to check sugar daily and as needed.  Dx Code: E11.9 (Patient not taking: Reported on 03/13/2022) 100 each 5  ? Multiple Vitamin (MULTIVITAMIN) capsule Take 1 capsule by mouth  daily. (Patient not taking: Reported on 03/13/2022)    ? OneTouch Delica Lancets 44I MISC Use as directed once daily and as needed.  Dx Code: E11.9 (Patient not taking: Reported on 03/13/2022) 100 each 5  ? vitamin C (ASCORBIC ACID) 500 MG tablet Take 1,000 mg by mouth 2 (two) times daily. (Patient not taking: Reported on 03/13/2022)    ? atorvastatin (LIPITOR) 40 MG tablet TAKE 1 TABLET(40 MG) BY MOUTH DAILY. STOP SIMVASTATIN (Patient not taking: Reported on 03/13/2022) 90 tablet 0  ? metFORMIN (GLUCOPHAGE) 500 MG tablet TAKE 1 TABLET(500 MG) BY MOUTH DAILY WITH BREAKFAST (Patient not taking: Reported on 03/13/2022) 60 tablet 0  ? ?No facility-administered medications prior to visit.  ? ? ?No Known Allergies ? ?ROS ? ?  See HPI ?Objective:  ?  ?Physical Exam ?Constitutional:   ?   General: He is not in acute distress. ?   Appearance: He is well-developed.  ?HENT:  ?   Head: Normocephalic and atraumatic.  ?Cardiovascular:  ?   Rate and Rhythm: Normal rate and regular rhythm.  ?   Heart sounds: No murmur heard. ?Pulmonary:  ?   Effort: Pulmonary effort is normal. No respiratory distress.  ?   Breath sounds: Normal breath sounds. No wheezing or rales.  ?Skin: ?   General: Skin is warm and dry.  ?Neurological:  ?   Mental Status: He is alert and oriented to person, place, and time.  ?Psychiatric:     ?   Behavior: Behavior normal.     ?   Thought Content: Thought content normal.  ? ? ?BP 117/66 (BP Location: Left Arm, Patient Position: Sitting, Cuff Size: Small)   Pulse 90   Temp 98.1 ?F (36.7 ?C) (Oral)   Resp 16   Wt 139 lb (63 kg)   SpO2 98%   BMI 22.44 kg/m?  ?Wt Readings from Last 3 Encounters:  ?03/13/22 139 lb (63 kg)  ?05/28/21 141 lb (64 kg)  ?02/27/21 148 lb (67.1 kg)  ? ? ?   ?Assessment & Plan:  ? ?Problem List Items Addressed This Visit   ? ?  ? Unprioritized  ? Mixed hyperlipidemia  ?  Not taking statin. Resume atorvastatin. Recheck level.  ? ?  ?  ? Relevant Medications  ? atorvastatin (LIPITOR) 40 MG  tablet  ? Other Relevant Orders  ? Lipid panel  ? Diabetes type 2, controlled ([place])  ?  Has not been taking metformin. Friend will help him with his medications. Restart. Check follow up A1C.  ? ?  ?  ? Relevant Medications  ? atorvastatin (  LIPITOR) 40 MG tablet  ? metFORMIN (GLUCOPHAGE) 500 MG tablet  ? Other Relevant Orders  ? Comp Met (CMET)  ? Hemoglobin A1c  ? Dementia without behavioral disturbance, psychotic disturbance, mood disturbance, or anxiety ([place]) - Primary  ?  New diagnosis for patient. Will refer to Neurology for formal evaluation. I advised friend and daughter that I don't think pt should be allowed to drive or operate the stove. Friend states that she is going to allow the patient to stay with her in her spare bedroom so he is not alone and she can look after his safety/help with ADL's.  Will request a Community Care referral for SW to see what other resources may be available to him. Check labs as ordered to rule out contributing medical factors to his memory loss.  ? ?  ?  ? Relevant Orders  ? AMB Referral to [place]  ? Ambulatory referral to Neurology  ? ?Other Visit Diagnoses   ? ? Memory loss      ? Relevant Orders  ? TSH  ? B12  ? RPR  ? ?  ? ? ?I am having Bertell Maria. Harter maintain his aspirin, Calcium Carbonate (CALCIUM 500 PO), multivitamin, vitamin C, (Ginger, Zingiber officinalis, (GINGER ROOT PO)), OneTouch Delica Lancets 14H, ONE TOUCH ULTRA 2, OneTouch Ultra, atorvastatin, and metFORMIN. ? ?Meds ordered this encounter  ?Medications  ? atorvastatin (LIPITOR) 40 MG tablet  ?  Sig: TAKE 1 TABLET(40 MG) BY MOUTH DAILY. STOP SIMVASTATIN  ?  Dispense:  90 tablet  ?  Refill:  1  ?  Order Specific Question:   Supervising Provider  ?  Answer:   Penni Homans A [[id number]]  ? metFORMIN (GLUCOPHAGE) 500 MG tablet  ?  Sig: TAKE 1 TABLET(500 MG) BY MOUTH DAILY WITH BREAKFAST  ?  Dispense:  180 tablet  ?  Refill:  1  ?  Order Specific Question:   Supervising Provider  ?  Answer:   Penni Homans A [[id number]]  ? ?

## 2022-03-13 NOTE — Patient Instructions (Signed)
Please complete lab work prior to leaving.   

## 2022-03-13 NOTE — Assessment & Plan Note (Signed)
New diagnosis for patient. Will refer to Neurology for formal evaluation. I advised friend and daughter that I don't think pt should be allowed to drive or operate the stove. Friend states that she is going to allow the patient to stay with her in her spare bedroom so he is not alone and she can look after his safety/help with ADL's.  Will request a Community Care referral for SW to see what other resources may be available to him. Check labs as ordered to rule out contributing medical factors to his memory loss.  ?

## 2022-03-14 LAB — RPR: RPR Ser Ql: NONREACTIVE

## 2022-03-16 ENCOUNTER — Telehealth: Payer: Self-pay | Admitting: *Deleted

## 2022-03-16 NOTE — Chronic Care Management (AMB) (Signed)
?  Chronic Care Management  ? ?Outreach Note ? ?03/16/2022 ?Name: Cassie Henkels MRN: [id number] DOB: 29-Jul-1941 ? ?Kieren Adkison Cotten is a 81 y.o. year old male who is a primary care patient of Mosie Lukes, MD. I reached out to [facility] by phone today in response to a referral sent by Mr. Espen Bethel Leet's primary care provider. ? ?An unsuccessful telephone outreach was attempted today. The patient was referred to the case management team for assistance with care management and care coordination.  ? ?Follow Up Plan: A HIPAA compliant phone message was left for the patient providing contact information and requesting a return call.  ? ?Isbella Arline, CCMA ?Care Guide, Embedded Care Coordination ?[place]  Care Management  ?Direct Dial: [phone] ? ? ?

## 2022-03-16 NOTE — Chronic Care Management (AMB) (Signed)
?  Chronic Care Management  ? ?Note ? ?03/16/2022 ?Name: Marvin Mcdonald MRN: [id number] DOB: 11-14-1940 ? ?[name] is a 80 y.o. year old male who is a primary care patient of Mosie Lukes, MD. I reached out to [facility] by phone today in response to a referral sent by Marvin Mcdonald's PCP. ? ?Marvin Mcdonald was given information about Chronic Care Management services today including:  ?CCM service includes personalized support from designated clinical staff supervised by his physician, including individualized plan of care and coordination with other care providers ?24/7 contact phone numbers for assistance for urgent and routine care needs. ?Service will only be billed when office clinical staff spend 20 minutes or more in a month to coordinate care. ?Only one practitioner may furnish and bill the service in a calendar month. ?The patient may stop CCM services at any time (effective at the end of the month) by phone call to the office staff. ?The patient is responsible for co-pay (up to 20% after annual deductible is met) if co-pay is required by the individual health plan.  ? ?Patient agreed to services and verbal consent obtained.  ? ?Follow up plan: ?Telephone appointment with care management team member scheduled for: 03/23/2022 ? ?Michoel Kunin, CCMA ?Care Guide, Embedded Care Coordination ?[place]  Care Management  ?Direct Dial: [phone] ? ? ?

## 2022-03-17 ENCOUNTER — Telehealth: Payer: Self-pay | Admitting: Family

## 2022-03-17 NOTE — Telephone Encounter (Signed)
Please advise daughter that sugar and cholesterol are above goal. I would like him to restart all of his medications as we discussed at his appointment.  ?

## 2022-03-17 NOTE — Telephone Encounter (Signed)
Results given to patient's daughter and advised of new prescription refills sent to his local pharmacy ?

## 2022-03-19 ENCOUNTER — Encounter: Payer: Self-pay | Admitting: Physician Assistant

## 2022-03-23 ENCOUNTER — Ambulatory Visit (INDEPENDENT_AMBULATORY_CARE_PROVIDER_SITE_OTHER): Payer: Medicare Other | Admitting: *Deleted

## 2022-03-23 DIAGNOSIS — H9193 Unspecified hearing loss, bilateral: Secondary | ICD-10-CM

## 2022-03-23 DIAGNOSIS — H9190 Unspecified hearing loss, unspecified ear: Secondary | ICD-10-CM

## 2022-03-23 DIAGNOSIS — E119 Type 2 diabetes mellitus without complications: Secondary | ICD-10-CM

## 2022-03-23 DIAGNOSIS — M810 Age-related osteoporosis without current pathological fracture: Secondary | ICD-10-CM

## 2022-03-23 DIAGNOSIS — R413 Other amnesia: Secondary | ICD-10-CM

## 2022-03-23 DIAGNOSIS — E782 Mixed hyperlipidemia: Secondary | ICD-10-CM

## 2022-03-23 DIAGNOSIS — F0390 Unspecified dementia without behavioral disturbance: Secondary | ICD-10-CM

## 2022-03-25 NOTE — Patient Instructions (Signed)
Visit Information   Thank you for taking time to visit with me today. Please don't hesitate to contact me if I can be of assistance to you before our next scheduled telephone appointment.  Following are the goals we discussed today:  Patient Goals/Self-Care Activities:  Daughter will work with LCSW on a bi-weekly basis, in an effort to obtain long-term memory care assisted living facility placement versus personal care services in the home, transportation, financial assistance, food, etc. Daughter will review list of resources, mailed to her home by LCSW on 03/23/2022, which include all of the following:    ~ 2023 Medicaid Tips, Application, and Instructions ~ SCAT Paramedic) Application - Part A & B ~ Actuary ~ Manufacturing engineer ~ [employer] ~ Programme researcher, broadcasting/film/video  ~ Adult Day Care Programs  ~ [place]  ~ Media planner, Development worker, community of [employer]  ~ Biomedical scientist  ~ Publishing rights manager, Instructions, and Agency Provider List Daughter will review Medicaid Tips to ensure eligibility. Daughter will complete application for Adult Medicaid and submit to the [place] for processing. Daughter will complete Part A & B of SCAT Paramedic) application, and submit to the Department of Transportation for processing.   Daughter will begin utilizing your transportation benefit through [employer]. Daughter will review complete list of transportation options, and begin utilizing agencies of interest. Daughter will apply for financial assistance, through [facility], as well as Programme researcher, broadcasting/film/video. Daughter will consider enrolling you in an Adult Day  Care Program. Daughter will begin [place], for consideration of placement. Daughter will complete application for Food Stamps, and submit to the [place] for processing. LCSW collaboration with [employer] of [place], [place], to place you on the waiting list to receive prepared meal delivery services. Daughter will complete application for [place], and submit to [employer], if approved for Adult Medicaid. Contact LCSW directly (# [phone]), if you have questions, need assistance, or if additional social work needs are identified between now and our next scheduled telephone outreach call. Follow-Up Date:  04/06/2022 at 10:00 am  Please call the care guide team at [phone] if you need to cancel or reschedule your appointment.   If you are experiencing a Mental Health or [place] or need someone to talk to, please call the Suicide and Crisis Lifeline: 988 call the [place] National Suicide Prevention Lifeline: (660)483-1869 or TTY: (228)021-7352 TTY 3301157445) to talk to a trained counselor call 1-800-273-TALK (toll free, 24 hour hotline) go to [facility] [phone]) call the [facility]: [phone] call 911   Following is a copy of your full care plan:  Care Plan : LCSW Plan of Care  Updates made by Francis Gaines, LCSW since 03/25/2022 12:00 AM     Problem: Find Help in My Community.   Priority: High     Goal: Find Help in My Community.   Start Date: 03/23/2022  Expected End Date: 06/23/2022  This Visit's Progress: On track  Priority: High  Note:   Current Barriers:   Financial constraints related to only receiving Social Security Income. Limited social support. Lack of transportation to and from physician  appointments. Level of care concerns due  to diagnosis of Dementia. Limited ability to purchase food and nutritional supplements. Limited access to caregiver and family support. Lacks knowledge of community resources. Clinical Goals:  Patient's daughter will work with LCSW, to address needs related to level of care concerns, transportation issues, financial difficulties, food insecurities, etc.   Interventions:  Collaboration with Primary Care Physician, Dr. Penni Homans, regarding development and update of comprehensive plan of care, as evidenced by provider attestation and co-signature. Inter-disciplinary care team collaboration (see longitudinal plan of care). Assessment of needs, barriers, agencies contacted, as well as how impacting.  Reviewed various financial resources, discussed options, and provided patient's daughter with information on how to apply for Adult Medicaid, through the [place]. Reviewed various transportation resources, discussed options, and provided patient's daughter with information about how to apply for Engineering geologist and [employer]. Reviewed various emergency assistance resources, discussed options, and provided patient's daughter with information about how to apply for emergency financial assistance. Reviewed various long-term memory care assisted living facilities, discussed options, and provided patient's daughter with a complete list of facilities.     Referral placed to [place] for community support, assistance, resources, and other options.  Clinical Interventions: Patient's daughter interviewed and appropriate assessments performed.  Discussed plans with patient's daughter for ongoing care management follow-up, and provided direct contact information for care management team. Advised patient's daughter to begin contacting resources provided. Assisted  patient's daughter with obtaining information about health plan benefits through [employer]. PHQ 2 and PHQ 9 Depression Screen completed and results reviewed with daughter. Solution-Focused Strategies implemented. Active Listening/Reflection utilized. Emotional Support provided. Behavioral Activation implemented. Problem Solving/Task-Centered Solutions established. Quality of Sleep Assessed and Sleep Hygiene Techniques promoted. Increase in actives/exercise encouraged. Verbalization of feelings encouraged. Suicidal Ideation/Homicidal Ideation assessed - none present.  Patient Goals/Self-Care Activities:  Daughter will work with LCSW on a bi-weekly basis, in an effort to obtain long-term memory care assisted living facility placement versus personal care services in the home, transportation, financial assistance, food, etc. Daughter will review list of resources, mailed to her home by LCSW on 03/23/2022, which include all of the following:    ~ 2023 Medicaid Tips, Application, and Instructions ~ SCAT Paramedic) Application - Part A & B ~ Actuary ~ Manufacturing engineer ~ [employer] ~ Programme researcher, broadcasting/film/video  ~ Adult Day Care Programs  ~ [place]  ~ Media planner, Development worker, community of [employer]  ~ Biomedical scientist  ~ Publishing rights manager, Instructions, and Agency Provider List Daughter will review Medicaid Tips to ensure eligibility. Daughter will complete application for Adult Medicaid and submit to the [place] for processing. Daughter will complete Part A & B of SCAT Paramedic) application, and submit to the Department of Transportation for processing.   Daughter will begin utilizing your transportation benefit  through [employer]. Daughter will review complete list of transportation options, and begin utilizing agencies of interest. Daughter will apply for financial assistance, through [facility], as well as Programme researcher, broadcasting/film/video. Daughter will consider enrolling you in an Adult Day Care Program. Daughter will begin [place], for consideration of placement. Daughter will complete application for Food Stamps, and submit to the [place] for processing. LCSW collaboration with [employer]  Resources of [place], [place], to place you on the waiting list to receive prepared meal delivery services. Daughter will complete application for [place], and submit to [employer], if approved for Adult Medicaid. Contact LCSW directly (# [phone]), if you have questions, need assistance, or if additional social work needs are identified between now and our next scheduled telephone outreach call. Follow-Up Date:  04/06/2022 at 10:00 am      Consent to CCM Services: Mr. Mentink was given information about Chronic Care Management services including:  CCM service includes personalized support from designated clinical staff supervised by his physician, including individualized plan of care and coordination with other care providers 24/7 contact phone numbers for assistance for urgent and routine care needs. Service will only be billed when office clinical staff spend 20 minutes or more in a month to coordinate care. Only one practitioner may furnish and bill the service in a calendar month. The patient may stop CCM services at any time (effective at the end of the month) by phone call to the office staff. The patient will be responsible for cost sharing (co-pay) of up to 20% of the service fee (after  annual deductible is met).  Patient agreed to services and verbal consent obtained.   Patient verbalizes understanding of instructions and care plan provided today and agrees to view in [place]. Active MyChart status and patient understanding of how to access instructions and care plan via MyChart confirmed with patient.     Telephone follow up appointment with care management team member scheduled for:  04/06/2022 at 10:00 am  [place] Social Worker [place] [phone]

## 2022-03-25 NOTE — Chronic Care Management (AMB) (Signed)
Chronic Care Management    Clinical Social Work Note  03/25/2022 Name: Marvin Mcdonald MRN: [id number] DOB: 1941-08-12  Marvin Mcdonald is a 81 y.o. year old male who is a primary care patient of Mosie Lukes, MD. The CCM team was consulted to assist the patient with chronic disease management and/or care coordination needs related to: Transportation Needs, [employer],  Food Insecurity, Level of Care Concerns, Caregiver Stress, and Financial Difficulties.   Engaged with patient's daughter by telephone for initial visit in response to provider referral for social work chronic care management and care coordination services.   Consent to Services:  The patient was given information about Chronic Care Management services, agreed to services, and gave verbal consent prior to initiation of services.  Please see initial visit note for detailed documentation.   Patient agreed to services and consent obtained.   Assessment: Review of patient past medical history, allergies, medications, and health status, including review of relevant consultants reports was performed today as part of a comprehensive evaluation and provision of chronic care management and care coordination services.     SDOH (Social Determinants of Health) assessments and interventions performed:  SDOH Interventions    Flowsheet Row Most Recent Value  SDOH Interventions   Food Insecurity Interventions Intervention Not Indicated, Other (Comment)  [Verified by Daughter, Eun Catena]  Financial Strain Interventions Intervention Not Indicated, Other (Comment)  [Verified by Daughter, [place] Interventions Intervention Not Indicated, Other (Comment)  [Verified by Daughter, Eun Lacina]  Physical Activity Interventions Intervention Not Indicated, Other (Comments)  [Verified by Daughter, Eun Donigan]  Stress Interventions Intervention Not Indicated, Other (Comment), Offered [employer]  [Verified by Daughter, Consulting civil engineer  Dace]  Social Connections Interventions Intervention Not Indicated, Other (Comment)  [Verified by Daughter, Eun Shapley]  Transportation Interventions Intervention Not Indicated, Other (Comment)  [Verified by Daughter, Eun [place]]        Advanced Directives Status: See Care Plan for related entries.  CCM Care Plan  No Known Allergies  Outpatient Encounter Medications as of 03/23/2022  Medication Sig   aspirin 81 MG tablet Take 81 mg by mouth daily. (Patient not taking: Reported on 03/13/2022)   atorvastatin (LIPITOR) 40 MG tablet TAKE 1 TABLET(40 MG) BY MOUTH DAILY. STOP SIMVASTATIN   Blood Glucose Monitoring Suppl (ONE TOUCH ULTRA 2) w/Device KIT Use glucometer to test sugars once daily and as needed. Dx E11.9 (Patient not taking: Reported on 03/13/2022)   Calcium Carbonate (CALCIUM 500 PO) Take by mouth 2 (two) times daily. (Patient not taking: Reported on 03/13/2022)   Ginger, Zingiber officinalis, (GINGER ROOT PO) Take by mouth. (Patient not taking: Reported on 03/13/2022)   glucose blood (ONETOUCH ULTRA) test strip Use to check sugar daily and as needed.  Dx Code: E11.9 (Patient not taking: Reported on 03/13/2022)   metFORMIN (GLUCOPHAGE) 500 MG tablet TAKE 1 TABLET(500 MG) BY MOUTH DAILY WITH BREAKFAST   Multiple Vitamin (MULTIVITAMIN) capsule Take 1 capsule by mouth daily. (Patient not taking: Reported on 07/03/5175)   OneTouch Delica Lancets 16W MISC Use as directed once daily and as needed.  Dx Code: E11.9 (Patient not taking: Reported on 03/13/2022)   vitamin C (ASCORBIC ACID) 500 MG tablet Take 1,000 mg by mouth 2 (two) times daily. (Patient not taking: Reported on 03/13/2022)   No facility-administered encounter medications on file as of 03/23/2022.    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  Film/video editor, Limited Social Support, Transport planner, Nurse, adult to [employer], Housing Barriers, Level of Care Concerns, ADL/IADL Limitations, Social Isolation, Limited Access to Caregiver, Cognitive Deficits, Memory Deficits, and Lacks Knowledge of [employer].  Care Plan : LCSW Plan of Care  Updates made by Francis Gaines, LCSW since 03/25/2022 12:00 AM     Problem: Find Help in My Community.   Priority: High     Goal: Find Help in My Community.   Start Date: 03/23/2022  Expected End Date: 06/23/2022  This Visit's Progress: On track  Priority: High  Note:   Current Barriers:   Financial constraints related to only receiving Social Security Income. Limited social support. Lack of transportation to and from physician appointments. Level of care concerns due to diagnosis of Dementia. Limited ability to purchase food and nutritional supplements. Limited access to caregiver and family support. Lacks knowledge of community resources. Clinical Goals:  Patient's daughter will work with LCSW, to address needs related to level of care concerns, transportation issues, financial difficulties, food insecurities, etc.   Interventions:  Collaboration with Primary Care Physician, Dr. Penni Homans, regarding development and update of  comprehensive plan of care, as evidenced by provider attestation and co-signature. Inter-disciplinary care team collaboration (see longitudinal plan of care). Assessment of needs, barriers, agencies contacted, as well as how impacting.  Reviewed various financial resources, discussed options, and provided patient's daughter with information on how to apply for Adult Medicaid, through the [place]. Reviewed various transportation resources, discussed options, and provided patient's daughter with information about how to apply for Engineering geologist and [employer]. Reviewed various emergency assistance resources, discussed options, and provided patient's daughter with information about how to apply for emergency financial assistance. Reviewed various long-term memory care assisted living facilities, discussed options, and provided patient's daughter with a complete list of facilities.     Referral placed to [place] for community support, assistance, resources, and other options.  Clinical Interventions: Patient's daughter interviewed and appropriate assessments performed.  Discussed plans with patient's daughter for ongoing care management follow-up, and provided direct contact information for care management team. Advised patient's daughter to begin contacting resources provided. Assisted patient's daughter with obtaining information about health plan benefits through [employer]. PHQ 2 and PHQ 9 Depression Screen completed and results reviewed with daughter. Solution-Focused Strategies implemented. Active Listening/Reflection utilized. Emotional Support provided. Behavioral Activation implemented. Problem Solving/Task-Centered Solutions established. Quality of Sleep Assessed and Sleep Hygiene Techniques promoted. Increase in actives/exercise encouraged. Verbalization of  feelings encouraged. Suicidal Ideation/Homicidal Ideation assessed - none present.  Patient Goals/Self-Care Activities:  Daughter will work with LCSW on a bi-weekly basis, in an effort to obtain long-term memory care assisted living facility placement versus  personal care services in the home, transportation, financial assistance, food, etc. Daughter will review list of resources, mailed to her home by LCSW on 03/23/2022, which include all of the following:    ~ 2023 Medicaid Tips, Application, and Instructions ~ SCAT Paramedic) Application - Part A & B ~ Actuary ~ Manufacturing engineer ~ [employer] ~ Programme researcher, broadcasting/film/video  ~ Adult Day Care Programs  ~ [place]  ~ Media planner, Development worker, community of [employer]  ~ Biomedical scientist  ~ Publishing rights manager, Instructions, and Agency Provider List Daughter will review Medicaid Tips to ensure eligibility. Daughter will complete application for Adult Medicaid and submit to the [place] for processing. Daughter will complete Part A & B of SCAT Paramedic) application, and submit to the Department of Transportation for processing.   Daughter will begin utilizing your transportation benefit through [employer]. Daughter will review complete list of transportation options, and begin utilizing agencies of interest. Daughter will apply for financial assistance, through [facility], as well as Programme researcher, broadcasting/film/video. Daughter will consider enrolling you in an Adult Day Care Program. Daughter will begin [place], for consideration of placement. Daughter will complete  application for Food Stamps, and submit to the [place] for processing. LCSW collaboration with [employer] of [place], [place], to place you on the waiting list to receive prepared meal delivery services. Daughter will complete application for [place], and submit to [employer], if approved for Adult Medicaid. Contact LCSW directly (# [phone]), if you have questions, need assistance, or if additional social work needs are identified between now and our next scheduled telephone outreach call. Follow-Up Date:  04/06/2022 at 10:00 am    Nat Christen Harney Clinical Social Worker [place] [phone]

## 2022-03-31 ENCOUNTER — Ambulatory Visit: Payer: Medicare Other | Admitting: Physician Assistant

## 2022-04-01 ENCOUNTER — Telehealth: Payer: Self-pay | Admitting: Family Medicine

## 2022-04-01 DIAGNOSIS — E118 Type 2 diabetes mellitus with unspecified complications: Secondary | ICD-10-CM

## 2022-04-01 DIAGNOSIS — F0390 Unspecified dementia without behavioral disturbance: Secondary | ICD-10-CM

## 2022-04-01 MED ORDER — METFORMIN HCL 500 MG PO TABS: ORAL_TABLET | ORAL | 1 refills | Status: DC

## 2022-04-01 NOTE — Telephone Encounter (Signed)
Prescription has been resent

## 2022-04-01 NOTE — Telephone Encounter (Signed)
Eun (Daughter DPR OK) called stating pt lost his metformin and is unable to find it. Marvin Mcdonald would like to know if there is a way to get this medication resent so he can begin taking it again.

## 2022-04-06 ENCOUNTER — Ambulatory Visit (INDEPENDENT_AMBULATORY_CARE_PROVIDER_SITE_OTHER): Payer: Medicare Other | Admitting: *Deleted

## 2022-04-06 DIAGNOSIS — R413 Other amnesia: Secondary | ICD-10-CM

## 2022-04-06 DIAGNOSIS — H9193 Unspecified hearing loss, bilateral: Secondary | ICD-10-CM

## 2022-04-06 DIAGNOSIS — F0390 Unspecified dementia without behavioral disturbance: Secondary | ICD-10-CM

## 2022-04-06 DIAGNOSIS — I10 Essential (primary) hypertension: Secondary | ICD-10-CM

## 2022-04-06 DIAGNOSIS — E782 Mixed hyperlipidemia: Secondary | ICD-10-CM

## 2022-04-06 DIAGNOSIS — E119 Type 2 diabetes mellitus without complications: Secondary | ICD-10-CM

## 2022-04-06 DIAGNOSIS — H9190 Unspecified hearing loss, unspecified ear: Secondary | ICD-10-CM

## 2022-04-06 NOTE — Chronic Care Management (AMB) (Signed)
Chronic Care Management    Clinical Social Work Note  04/06/2022 Name: Marvin Mcdonald MRN: [id number] DOB: September 15, 1941  Marvin Mcdonald is a 81 y.o. year old male who is a primary care patient of Mosie Lukes, MD. The CCM team was consulted to assist the patient with chronic disease management and/or care coordination needs related to: Transportation Needs, Appointment Scheduling Needs, [employer], Food Insecurity, Level of Care Concerns, Caregiver Stress, and Financial Difficulties.  Engaged with patient's daughter by telephone for follow up visit in response to provider referral for social work chronic care management and care coordination services.   Consent to Services:  The patient was given the following information about Chronic Care Management services today, agreed to services, and gave verbal consent: 1. CCM service includes personalized support from designated clinical staff supervised by the primary care provider, including individualized plan of care and coordination with other care providers 2. 24/7 contact phone numbers for assistance for urgent and routine care needs. 3. Service will only be billed when office clinical staff spend 20 minutes or more in a month to coordinate care. 4. Only one practitioner may furnish and bill the service in a calendar month. 5.The patient may stop CCM services at any time (effective at the end of the month) by phone call to the office staff. 6. The patient will be responsible for cost sharing (co-pay) of up to 20% of the service fee (after annual deductible is met). Patient agreed to services and consent obtained.  Patient agreed to services and consent obtained.   Assessment: Review of patient past medical history, allergies, medications, and health status, including review of relevant consultants reports was performed today as part of a comprehensive evaluation and provision of chronic care management and care coordination services.      SDOH (Social Determinants of Health) assessments and interventions performed:    Advanced Directives Status: Not addressed in this encounter.  CCM Care Plan  No Known Allergies  Outpatient Encounter Medications as of 04/06/2022  Medication Sig   aspirin 81 MG tablet Take 81 mg by mouth daily. (Patient not taking: Reported on 03/13/2022)   atorvastatin (LIPITOR) 40 MG tablet TAKE 1 TABLET(40 MG) BY MOUTH DAILY. STOP SIMVASTATIN   Blood Glucose Monitoring Suppl (ONE TOUCH ULTRA 2) w/Device KIT Use glucometer to test sugars once daily and as needed. Dx E11.9 (Patient not taking: Reported on 03/13/2022)   Calcium Carbonate (CALCIUM 500 PO) Take by mouth 2 (two) times daily. (Patient not taking: Reported on 03/13/2022)   Ginger, Zingiber officinalis, (GINGER ROOT PO) Take by mouth. (Patient not taking: Reported on 03/13/2022)   glucose blood (ONETOUCH ULTRA) test strip Use to check sugar daily and as needed.  Dx Code: E11.9 (Patient not taking: Reported on 03/13/2022)   metFORMIN (GLUCOPHAGE) 500 MG tablet TAKE 1 TABLET(500 MG) BY MOUTH DAILY WITH BREAKFAST   Multiple Vitamin (MULTIVITAMIN) capsule Take 1 capsule by mouth daily. (Patient not taking: Reported on 3/76/2831)   OneTouch Delica Lancets 51V MISC Use as directed once daily and as needed.  Dx Code: E11.9 (Patient not taking: Reported on 03/13/2022)   vitamin C (ASCORBIC ACID) 500 MG tablet Take 1,000 mg by mouth 2 (two) times daily. (Patient not taking: Reported on 03/13/2022)   No facility-administered encounter medications on file as of 04/06/2022.    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  Film/video editor, Limited Social Support, Transportation, Limited Access to [employer], Level of Care Concerns, ADL/IADL Limitations, Mental Health Concerns, Social Isolation, Limited Access to Caregiver, Cognitive Deficits, Memory Deficits, and Lacks Knowledge of [employer].  Care Plan : LCSW Plan of Care  Updates made by Francis Gaines, LCSW since 04/06/2022 12:00 AM     Problem: Find Help in My Community.   Priority: High     Goal: Find Help in My Community.   Start Date: 03/23/2022  Expected End Date: 06/23/2022  This Visit's Progress: On track  Recent Progress: On track  Priority: High  Note:   Current Barriers:   Financial constraints related to only receiving Social Security Income. Limited social support. Lack of transportation to and from physician appointments. Level of care concerns due to diagnosis of Dementia. Limited ability to purchase food and nutritional supplements. Limited access to caregiver and family support. Lacks knowledge of community resources. Clinical Goals:  Patient's daughter will work with LCSW, to address needs related to level of care concerns, transportation issues, financial difficulties, food insecurities, etc.   Interventions:  Collaboration with Primary Care Physician, Dr. Penni Homans, regarding development and update of comprehensive plan of care, as evidenced by provider attestation and co-signature. Inter-disciplinary  care team collaboration (see longitudinal plan of care). Reviewed various financial resources, discussed options, and provided patient's daughter with information on how to apply for Adult Medicaid, through the [place]. Reviewed various transportation resources, discussed options, and provided patient's daughter with information about how to apply for Engineering geologist and [employer]. Reviewed various emergency assistance resources, discussed options, and provided patient's daughter with information about how to apply for emergency financial assistance. Reviewed various long-term memory care assisted living facilities, discussed options, and provided patient's daughter with a complete list of facilities.     Referral placed to [place] for community support, assistance, resources, and other options.  Clinical Interventions: Advised patient's daughter to begin contacting agencies and resources of interest.  Solution-Focused Strategies implemented. Active Listening/Reflection utilized. Emotional Support provided. Behavioral Activation implemented. Problem Solving/Task-Centered Solutions established. Patient Goals/Self-Care Activities:  Daughter will continue to work with LCSW on a bi-weekly basis, in an effort to obtain long-term memory care assisted living facility placement versus personal care services in the home, transportation, financial assistance, and food. Thorough review with daughter, of the following list of agencies and resources:    ~ 2023 Medicaid Tips, Application, and Instructions ~ [employer] Paramedic) Application - Part A & B ~ Actuary ~ Manufacturing engineer ~ [employer] ~ Programme researcher, broadcasting/film/video  ~ Adult Day Care Programs  ~ [place]  ~ Media planner, Development worker, community of [place]  [place]  ~ Biomedical scientist  ~ Publishing rights manager, Instructions, and Agency Provider List Daughter will review Medicaid Tips to ensure eligibility. Daughter will complete application for Adult Medicaid and submit to the [place] for processing. Daughter will complete Part A & B of SCAT Paramedic) application, fax to [employer] for review and signature, then LCSW will submit to the Department of Transportation for processing.   Daughter will begin utilizing your transportation benefit through [employer]. Daughter will review complete list of transportation options, and begin utilizing agencies of interest. Daughter will apply for financial assistance, through [facility], as well as Programme researcher, broadcasting/film/video. Daughter will consider enrolling you in an Adult Day Care Program from the list provided. Daughter will begin [place], for consideration of placement. Daughter will complete application for Food Stamps, and submit to the [place] for processing. LCSW continued collaboration with [employer] of [place], [place], to place you on the waiting list to receive prepared meal delivery services. Daughter will complete application for [place], if approved for Medicaid, and submit to Primary Care Physician, Dr. Penni Homans for review and signature, then Dr. Frederik Pear nurse will fax to [facility] for processing. Contact LCSW directly (# [phone]), if you have questions, need assistance, or if additional social work needs are identified between now and our next scheduled telephone outreach call. Follow-Up Date:  04/20/2022  at 10:45 am    [place] Clinical Social Worker [place] [phone]

## 2022-04-06 NOTE — Patient Instructions (Addendum)
Visit Information  Thank you for taking time to visit with me today. Please don't hesitate to contact me if I can be of assistance to you before our next scheduled telephone appointment.  Following are the goals we discussed today:  Patient Goals/Self-Care Activities:  Daughter will continue to work with LCSW on a bi-weekly basis, in an effort to obtain long-term memory care assisted living facility placement versus personal care services in the home, transportation, financial assistance, and food. Thorough review with daughter, of the following list of agencies and resources:    ~ 2023 Medicaid Tips, Application, and Instructions ~ [employer] Paramedic) Application - Part A & B ~ Actuary ~ Manufacturing engineer ~ [employer] ~ Programme researcher, broadcasting/film/video  ~ Adult Day Care Programs  ~ [place]  ~ Media planner, Development worker, community of [employer]  ~ Biomedical scientist  ~ Publishing rights manager, Instructions, and Agency Provider List Daughter will review Medicaid Tips to ensure eligibility. Daughter will complete application for Adult Medicaid and submit to the [place] for processing. Daughter will complete Part A & B of SCAT Paramedic) application, fax to [employer] for review and signature, then LCSW will submit to the Department of Transportation for processing.   Daughter will begin utilizing your transportation benefit through [employer]. Daughter will review complete list of transportation options, and begin utilizing agencies of interest. Daughter will apply for financial assistance, through [facility], as well as Programme researcher, broadcasting/film/video. Daughter will consider  enrolling you in an Adult Day Care Program from the list provided. Daughter will begin [place], for consideration of placement. Daughter will complete application for Food Stamps, and submit to the [place] for processing. LCSW continued collaboration with [employer] of [place], [place], to place you on the waiting list to receive prepared meal delivery services. Daughter will complete application for [place], if approved for Medicaid, and submit to Primary Care Physician, Dr. Penni Homans for review and signature, then Dr. Frederik Pear nurse will fax to [facility] for processing. Contact LCSW directly (# [phone]), if you have questions, need assistance, or if additional social work needs are identified between now and our next scheduled telephone outreach call. Follow-Up Date:  04/20/2022 at 10:45 am  Please call the care guide team at [phone] if you need to cancel or reschedule your appointment.   If you are experiencing a Mental Health or [place] or need someone to talk to, please call the Suicide and Crisis Lifeline: 988 call the [place] National Suicide Prevention Lifeline: 615-176-7982 or TTY: 249-103-1614 TTY 201-076-8210) to talk to a trained counselor call 1-800-273-TALK (toll free, 24 hour hotline) go to [facility] [address], [place] [phone]) call the [place]: [phone] call 911   Patient verbalizes understanding of instructions and care plan provided today and agrees to view in [place]. Active MyChart status and patient understanding of how to access instructions and care plan via MyChart confirmed with patient.     [place] Licensed Clinical Social Worker [facility] [phone]

## 2022-04-09 ENCOUNTER — Ambulatory Visit: Payer: Medicare Other | Admitting: Physician Assistant

## 2022-04-09 ENCOUNTER — Encounter: Payer: Self-pay | Admitting: Physician Assistant

## 2022-04-09 VITALS — BP 163/77 | HR 70 | Resp 18 | Ht 69.0 in | Wt 144.0 lb

## 2022-04-09 DIAGNOSIS — F03A0 Unspecified dementia, mild, without behavioral disturbance, psychotic disturbance, mood disturbance, and anxiety: Secondary | ICD-10-CM | POA: Diagnosis not present

## 2022-04-09 DIAGNOSIS — R413 Other amnesia: Secondary | ICD-10-CM | POA: Diagnosis not present

## 2022-04-09 NOTE — Progress Notes (Cosign Needed)
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  Marvin Mcdonald is a very pleasant 81 y.o. year old RH male with  a history of hypertension, hyperlipidemia, seen today for evaluation of memory loss. MoCA today is 18/30 with delayed recall 0/5 and deficiencies in abstraction, attention. He has recently lost his wife and a component of depression may be present.     Recommendations:   MRI brain with/without contrast to assess for underlying structural abnormality and assess vascular load  Continue to monitor situational depression with PCP  Folllow up in 1 month  Subjective:     The patient is accompanied by  his daughter  who supplements the history.   How long did patient have memory difficulties? For about 6-8 months  HE has issues with STM and learning new tasks. His daughter has to "teach him how to use the remote and the cell phone several times". He also forgets appointments  Patient lives with:  Trying to sell the house, so he is "figuring things". He is likely to live with a friend for safety. Social work is involved in all of his ADLs needs. repeats oneself? Endorsed  Disoriented when walking into a room?  Patient denies   Leaving objects in unusual places?  Patient denies   Ambulates  with difficulty?   Patient denies , but admits to not walking enough Recent falls?  Patient denies   Any head injuries?  Patient denies   History of seizures?   Patient denies   Wandering behavior?  Patient denies   Patient drives?   He continues to drive, and his daughter noted a decreased response versus less attentiveness, but does short distances only  Any mood changes "no really serious mood changes"-daughter says  Any history of depression?:   Endorsed "to a certain degree since his wife died". She died suddenly last  22-Oct-2023. Since then he may feel "down", "A [employer] lady comes to care for him and keeps him company".  Hallucinations?  Patient denies   Paranoia?  Patient denies   Patient reports that he sleeps well without vivid dreams, REM behavior or sleepwalking     History of sleep apnea?  Patient denies   Any hygiene concerns? Endorsed, he doesn't want to shower, he reports that he does it once every 2 weeks , has decreased interest in changing clothes Independent of bathing and dressing?  Endorsed  Does the patient needs help with medications? Daughter is in charge because he was forgetting doses Who is in charge of the finances? Daughter is in charge  Any changes in appetite?  Patient denies, his daughter prepares meals for him Patient have trouble swallowing? Patient denies   Does the patient cook?  Patient denies   Any kitchen accidents such as leaving the stove on? Patient denies   Any headaches?  Patient denies   Double vision? Patient denies   Any focal numbness or tingling?  Patient denies   Chronic back pain Patient denies   Unilateral weakness?  Patient denies   Any tremors?  Patient denies   Any history of anosmia?  Patient denies   Any incontinence of urine?  Patient denies   Any bowel dysfunction?   Patient denies   History of heavy alcohol intake?  Patient denies   History of heavy tobacco use?  Patient denies  Family history of dementia?  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 STOP SIMVASTATIN   Blood Glucose Monitoring Suppl (ONE TOUCH ULTRA 2) w/Device KIT Use glucometer to test sugars once daily and as needed. Dx E11.9   Calcium Carbonate (CALCIUM 500 PO) 2 times daily   Ginger, Zingiber officinalis, (GINGER ROOT PO) Take by mouth.   glucose  blood (ONETOUCH ULTRA) test strip Use to check sugar daily and as needed.  Dx Code: E11.9   metFORMIN (GLUCOPHAGE) 500 MG tablet TAKE 1 TABLET(500 MG) BY MOUTH DAILY WITH BREAKFAST   Multiple Vitamin (MULTIVITAMIN) capsule 1 capsule, Daily   OneTouch Delica Lancets 23N MISC Use as directed once daily and as needed.  Dx Code: E11.9   vitamin C (ASCORBIC ACID) 1,000 mg, 2 times daily     VITALS:   Vitals:   04/09/22 1015  BP: (!) 163/77  Pulse: 70  Resp: 18  SpO2: 98%  Weight: 144 lb (65.3 kg)  Height: 5' 9" (1.753 m)      03/25/2022    1:13 PM 08/15/2020    9:17 AM 07/13/2019    1:36 PM 10/13/2016    8:46 AM 05/21/2015    3:53 PM  Depression screen PHQ 2/9  Decreased Interest 0 0 0 0 0  Down, Depressed, Hopeless 0 0 0 0 0  PHQ - 2 Score 0 0 0 0 0    PHYSICAL EXAM   HEENT:  Normocephalic, atraumatic. The mucous membranes are moist. The superficial temporal arteries are without ropiness or tenderness. Cardiovascular: Regular rate and rhythm. Lungs: Clear to auscultation bilaterally. Neck: There are no carotid bruits noted bilaterally.  NEUROLOGICAL:    04/09/2022   11:00 AM  Montreal Cognitive Assessment   Visuospatial/ Executive (0/5) 3  Naming (0/3) 3  Attention: Read list of digits (0/2) 1  Attention: Read list of letters (0/1) 1  Attention: Serial 7 subtraction starting at 100 (0/3) 3  Language: Repeat phrase (0/2) 1  Language : Fluency (0/1) 1  Abstraction (0/2) 0  Delayed Recall (0/5) 0  Orientation (0/6) 5  Total 18  Adjusted Score (based on education) 18       10/13/2016    8:47 AM  MMSE - Mini Mental State Exam  Orientation to time 5  Orientation to Place 5  Registration 3  Attention/ Calculation 5  Recall 3  Language- name 2 objects 2  Language- repeat 1  Language- follow 3 step command 3  Language- read & follow direction 0  Language-read & follow direction-comments Pt speaks/reads [place].  Write a sentence 1  Copy design 1  Total score 29      Orientation:  Alert and oriented to person, place and time. No aphasia or dysarthria. Fund of knowledge is appropriate. Recent memory impaired and remote memory intact.  Attention and concentration are reduced   Able to name objects and repeat phrases 1/2 . Delayed recall  0/5 Cranial nerves: There is good facial symmetry. Extraocular muscles are intact and visual fields are full to confrontational testing. Speech is fluent and clear. Soft palate rises symmetrically and there is no tongue deviation. Hearing is intact to conversational tone. Tone: Tone is good throughout. Sensation: Sensation is intact to light touch and pinprick  throughout. Vibration is intact at the bilateral big toe.There is no extinction with double simultaneous stimulation. There is no sensory dermatomal level identified. Coordination: The patient has no difficulty with RAM's or FNF bilaterally. Normal finger to nose  Motor: Strength is 5/5 in the bilateral upper and lower extremities. There is no pronator drift. There are no fasciculations noted. DTR's: Deep tendon reflexes are 2/4 at the bilateral biceps, triceps, brachioradialis, patella and achilles.  Plantar responses are downgoing bilaterally. Gait and Station: The patient is able to ambulate without difficulty.The patient is able to heel toe walk without any difficulty.The patient is able to ambulate in a tandem fashion. The patient is able to stand in the Romberg position.    Thank you for allowing [place] the opportunity to participate in the care of this nice patient. Please do not hesitate to contact us for any questions or concerns.   Total time spent on today's visit was 64 minutes dedicated to this patient today, preparing to see patient, examining the patient, ordering tests and/or medications and counseling the patient, documenting clinical information in the EHR or other health record, independently interpreting results and communicating results to the patient/family,  discussing treatment and goals, answering patient's questions and coordinating care.  Cc:  Mosie Lukes, MD  Sharene Butters 04/09/2022 11:37 AM

## 2022-04-09 NOTE — Patient Instructions (Addendum)
It was a pleasure to see you today at our office.   Recommendations:  MRI of the brain, the radiology office will call you to arrange you appointment Follow up July 20 at 11:30   Whom to call:  Memory  decline, memory medications: Call our office [phone]   For psychiatric meds, mood meds: Please have your primary care physician manage these medications.   Counseling regarding caregiver distress, including caregiver depression, anxiety and issues regarding community resources, adult day care programs, adult living facilities, or memory care questions:   Feel free to contact [place], Social Worker at [phone]   For assessment of decision of mental capacity and competency:  Call Dr. Anthoney Harada, geriatric psychiatrist at [phone]  For guidance in geriatric dementia issues please call [facility] [phone]  For guidance regarding [facility] and if placement were needed at the facility, contact Arnell Asal, Social Worker tel: [phone]  If you have any severe symptoms of a stroke, or other severe issues such as confusion,severe chills or fever, etc call 911 or go to the ER as you may need to be evaluated further   Feel free to visit Facebook page " [facility]" for tips of how to care for people with memory problems.    Consider [facility]  [place], [place] [address] [phone]  Hours of Operation Mondays to Thursdays: 8 am to 8 pm,Fridays: 9 am to 8 pm, Saturdays: 9 am to 1 pm Sundays: Closed  [email or web address]://[email or web address].gov/departments/parks-recreation/active-adults-50/[facility]    RECOMMENDATIONS FOR ALL PATIENTS WITH MEMORY PROBLEMS: 1. Continue to exercise (Recommend 30 minutes of walking everyday, or 3 hours every week) 2. Increase social interactions - continue going to [place] and enjoy social gatherings with friends and family 3. Eat healthy, avoid fried foods and eat  more fruits and vegetables 4. Maintain adequate blood pressure, blood sugar, and blood cholesterol level. Reducing the risk of stroke and cardiovascular disease also helps promoting better memory. 5. Avoid stressful situations. Live a simple life and avoid aggravations. Organize your time and prepare for the next day in anticipation. 6. Sleep well, avoid any interruptions of sleep and avoid any distractions in the bedroom that may interfere with adequate sleep quality 7. Avoid sugar, avoid sweets as there is a strong link between excessive sugar intake, diabetes, and cognitive impairment We discussed the Mediterranean diet, which has been shown to help patients reduce the risk of progressive memory disorders and reduces cardiovascular risk. This includes eating fish, eat fruits and green leafy vegetables, nuts like almonds and hazelnuts, walnuts, and also use olive oil. Avoid fast foods and fried foods as much as possible. Avoid sweets and sugar as sugar use has been linked to worsening of memory function.  There is always a concern of gradual progression of memory problems. If this is the case, then we may need to adjust level of care according to patient needs. Support, both to the patient and caregiver, should then be put into place.      You have been referred for a neuropsychological evaluation (i.e., evaluation of memory and thinking abilities). Please bring someone with you to this appointment if possible, as it is helpful for the doctor to hear from both you and another adult who knows you well. Please bring eyeglasses and hearing aids if you wear them.    The evaluation will take approximately 3 hours and has two parts:   The first part is a clinical interview with the  neuropsychologist (Dr. Melvyn Novas or Dr. Nicole Kindred). During the interview, the neuropsychologist will speak with you and the individual you brought to the appointment.    The second part of the evaluation is testing with the  doctor's technician Hinton Dyer or Maudie Mercury). During the testing, the technician will ask you to remember different types of material, solve problems, and answer some questionnaires. Your family member will not be present for this portion of the evaluation.   Please note: We must reserve several hours of the neuropsychologist's time and the psychometrician's time for your evaluation appointment. As such, there is a No-Show fee of $100. If you are unable to attend any of your appointments, please contact our office as soon as possible to reschedule.    FALL PRECAUTIONS: Be cautious when walking. Scan the area for obstacles that may increase the risk of trips and falls. When getting up in the mornings, sit up at the edge of the bed for a few minutes before getting out of bed. Consider elevating the bed at the head end to avoid drop of blood pressure when getting up. Walk always in a well-lit room (use night lights in the walls). Avoid area rugs or power cords from appliances in the middle of the walkways. Use a walker or a cane if necessary and consider physical therapy for balance exercise. Get your eyesight checked regularly.  FINANCIAL OVERSIGHT: Supervision, especially oversight when making financial decisions or transactions is also recommended.  HOME SAFETY: Consider the safety of the kitchen when operating appliances like stoves, microwave oven, and blender. Consider having supervision and share cooking responsibilities until no longer able to participate in those. Accidents with firearms and other hazards in the house should be identified and addressed as well.   ABILITY TO BE LEFT ALONE: If patient is unable to contact 911 operator, consider using LifeLine, or when the need is there, arrange for someone to stay with patients. Smoking is a fire hazard, consider supervision or cessation. Risk of wandering should be assessed by caregiver and if detected at any point, supervision and safe proof recommendations  should be instituted.  MEDICATION SUPERVISION: Inability to self-administer medication needs to be constantly addressed. Implement a mechanism to ensure safe administration of the medications.   DRIVING: Regarding driving, in patients with progressive memory problems, driving will be impaired. We advise to have someone else do the driving if trouble finding directions or if minor accidents are reported. Independent driving assessment is available to determine safety of driving.   If you are interested in the driving assessment, you can contact the following:  [employer] in [place]  [place] [phone] or [phone]    [place] refers to food and lifestyle choices that are based on the traditions of countries located on the [employer]. This way of eating has been shown to help prevent certain conditions and improve outcomes for people who have chronic diseases, like kidney disease and heart disease. What are tips for following this plan? Lifestyle  Cook and eat meals together with your family, when possible. Drink enough fluid to keep your urine clear or pale yellow. Be physically active every day. This includes: Aerobic exercise like running or swimming. Leisure activities like gardening, walking, or housework. Get 7-8 hours of sleep each night. If recommended by your health care provider, drink red wine in moderation. This means 1 glass a day for nonpregnant women and 2  glasses a day for men. A glass of wine equals 5 oz (150 mL). Reading food labels  Check the serving size of packaged foods. For foods such as rice and pasta, the serving size refers to the amount of cooked product, not dry. Check the total fat in packaged foods. Avoid foods that have saturated fat or trans fats. Check the ingredients list for added sugars,  such as corn syrup. Shopping  At the grocery store, buy most of your food from the areas near the walls of the store. This includes: Fresh fruits and vegetables (produce). Grains, beans, nuts, and seeds. Some of these may be available in unpackaged forms or large amounts (in bulk). Fresh seafood. Poultry and eggs. Low-fat dairy products. Buy whole ingredients instead of prepackaged foods. Buy fresh fruits and vegetables in-season from local farmers markets. Buy frozen fruits and vegetables in resealable bags. If you do not have access to quality fresh seafood, buy precooked frozen shrimp or canned fish, such as tuna, salmon, or sardines. Buy small amounts of raw or cooked vegetables, salads, or olives from the deli or salad bar at your store. Stock your pantry so you always have certain foods on hand, such as olive oil, canned tuna, canned tomatoes, rice, pasta, and beans. Cooking  Cook foods with extra-virgin olive oil instead of using butter or other vegetable oils. Have meat as a side dish, and have vegetables or grains as your main dish. This means having meat in small portions or adding small amounts of meat to foods like pasta or stew. Use beans or vegetables instead of meat in common dishes like chili or lasagna. Experiment with different cooking methods. Try roasting or broiling vegetables instead of steaming or sauteing them. Add frozen vegetables to soups, stews, pasta, or rice. Add nuts or seeds for added healthy fat at each meal. You can add these to yogurt, salads, or vegetable dishes. Marinate fish or vegetables using olive oil, lemon juice, garlic, and fresh herbs. Meal planning  Plan to eat 1 vegetarian meal one day each week. Try to work up to 2 vegetarian meals, if possible. Eat seafood 2 or more times a week. Have healthy snacks readily available, such as: Vegetable sticks with hummus. Greek yogurt. Fruit and nut trail mix. Eat balanced meals throughout the week. This  includes: Fruit: 2-3 servings a day Vegetables: 4-5 servings a day Low-fat dairy: 2 servings a day Fish, poultry, or lean meat: 1 serving a day Beans and legumes: 2 or more servings a week Nuts and seeds: 1-2 servings a day Whole grains: 6-8 servings a day Extra-virgin olive oil: 3-4 servings a day Limit red meat and sweets to only a few servings a month What are my food choices? Mediterranean diet Recommended Grains: Whole-grain pasta. Brown rice. Bulgar wheat. Polenta. Couscous. Whole-wheat bread. [place]. Vegetables: Artichokes. Beets. Broccoli. Cabbage. Carrots. Eggplant. Green beans. Chard. Kale. Spinach. Onions. Leeks. Peas. Squash. Tomatoes. Peppers. Radishes. Fruits: Apples. Apricots. Avocado. Berries. Bananas. Cherries. Dates. Figs. Grapes. Lemons. Melon. Oranges. Peaches. Plums. Pomegranate. Meats and other protein foods: Beans. Almonds. Sunflower seeds. Pine nuts. Peanuts. [place]. Salmon. Scallops. Shrimp. [place]. Tilapia. Clams. Oysters. Eggs. Dairy: Low-fat milk. Cheese. Greek yogurt. Beverages: Water. Red wine. Herbal tea. Fats and oils: Extra virgin olive oil. Avocado oil. Grape seed oil. Sweets and desserts: [place] yogurt with honey. Baked apples. Poached pears. Trail mix. Seasoning and other foods: Basil. Cilantro. Coriander. Cumin. Mint. Parsley. Sage. Rosemary. Tarragon. Garlic. Oregano. Thyme. Pepper. Balsalmic vinegar. Tahini. Hummus. Tomato  sauce. Olives. Mushrooms. Limit these Grains: Prepackaged pasta or rice dishes. Prepackaged cereal with added sugar. Vegetables: Deep fried potatoes (french fries). Fruits: Fruit canned in syrup. Meats and other protein foods: Beef. Pork. Lamb. Poultry with skin. Hot dogs. [place] Salines. Dairy: Ice cream. Sour cream. Whole milk. Beverages: Juice. Sugar-sweetened soft drinks. Beer. Liquor and spirits. Fats and oils: Butter. Canola oil. Vegetable oil. Beef fat (tallow). Lard. Sweets and desserts: Cookies. Cakes. Pies. Candy. Seasoning  and other foods: Mayonnaise. Premade sauces and marinades. The items listed may not be a complete list. Talk with your dietitian about what dietary choices are right for you. Summary The Mediterranean diet includes both food and lifestyle choices. Eat a variety of fresh fruits and vegetables, beans, nuts, seeds, and whole grains. Limit the amount of red meat and sweets that you eat. Talk with your health care provider about whether it is safe for you to drink red wine in moderation. This means 1 glass a day for nonpregnant women and 2 glasses a day for men. A glass of wine equals 5 oz (150 mL). This information is not intended to replace advice given to you by your health care provider. Make sure you discuss any questions you have with your health care provider. Document Released: 06/11/2016 Document Revised: 07/14/2016 Document Reviewed: 06/11/2016 Elsevier Interactive Patient Education  2017 [employer].

## 2022-04-16 ENCOUNTER — Telehealth: Payer: Self-pay | Admitting: *Deleted

## 2022-04-16 NOTE — Chronic Care Management (AMB) (Signed)
  Chronic Care Management Note  04/16/2022 Name: Marvin Mcdonald MRN: [id number] DOB: [birth year]  Marvin Mcdonald is a 81 y.o. year old male who is a primary care patient of Mosie Lukes, MD and is actively engaged with the care management team. I reached out to [facility] by phone today to assist with re-scheduling a follow up visit with the Licensed Clinical Social Worker  Follow up plan: Unsuccessful telephone outreach attempt made. A HIPAA compliant phone message was left for the patient providing contact information and requesting a return call.  The care management team will reach out to the patient again over the next 7 days.  If patient returns call to provider office, please advise to call [place] at [phone].  [place] [facility]  Direct Dial: [phone]

## 2022-04-17 NOTE — Chronic Care Management (AMB) (Signed)
  Chronic Care Management Note  04/17/2022 Name: Marvin Mcdonald MRN: [id number] DOB: Nov 30, 1940  Marvin Mcdonald is a 81 y.o. year old male who is a primary care patient of Mosie Lukes, MD and is actively engaged with the care management team. I reached out to [facility] by phone today to assist with re-scheduling a follow up visit with the Licensed Clinical Social Worker  Follow up plan: Telephone appointment with care management team member scheduled for: 04/27/2022  Julian Hy, [place], [facility]  Direct Dial: [phone]

## 2022-04-20 ENCOUNTER — Telehealth: Payer: Medicare Other

## 2022-04-27 ENCOUNTER — Ambulatory Visit: Payer: Medicare Other | Admitting: *Deleted

## 2022-04-27 DIAGNOSIS — H547 Unspecified visual loss: Secondary | ICD-10-CM

## 2022-04-27 DIAGNOSIS — H9193 Unspecified hearing loss, bilateral: Secondary | ICD-10-CM

## 2022-04-27 DIAGNOSIS — R413 Other amnesia: Secondary | ICD-10-CM

## 2022-04-27 DIAGNOSIS — E119 Type 2 diabetes mellitus without complications: Secondary | ICD-10-CM

## 2022-04-27 DIAGNOSIS — M810 Age-related osteoporosis without current pathological fracture: Secondary | ICD-10-CM

## 2022-04-27 DIAGNOSIS — F0390 Unspecified dementia without behavioral disturbance: Secondary | ICD-10-CM

## 2022-04-27 DIAGNOSIS — E782 Mixed hyperlipidemia: Secondary | ICD-10-CM

## 2022-04-27 DIAGNOSIS — I10 Essential (primary) hypertension: Secondary | ICD-10-CM

## 2022-04-27 DIAGNOSIS — H9190 Unspecified hearing loss, unspecified ear: Secondary | ICD-10-CM

## 2022-04-27 NOTE — Chronic Care Management (AMB) (Signed)
Chronic Care Management    Clinical Social Work Note  04/27/2022 Name: Marvin Mcdonald MRN: [id number] DOB: 28-Sep-1941  Marvin Mcdonald is a 81 y.o. year old male who is a primary care patient of Bradd Canary, MD. The CCM team was consulted to assist the patient with chronic disease management and/or care coordination needs related to: Transportation Needs, [employer], Food Insecurity, Level of Care Concerns, Mental Health Counseling and Resources, Caregiver Stress, and Financial Difficulties.   Engaged with patient's daughter by telephone for follow up visit in response to provider referral for social work chronic care management and care coordination services.   Consent to Services:  The patient was given information about Chronic Care Management services, agreed to services, and gave verbal consent prior to initiation of services.  Please see initial visit note for detailed documentation.   Patient agreed to services and consent obtained.   Assessment: Review of patient past medical history, allergies, medications, and health status, including review of relevant consultants reports was performed today as part of a comprehensive evaluation and provision of chronic care management and care coordination services.     SDOH (Social Determinants of Health) assessments and interventions performed:    Advanced Directives Status: Not addressed in this encounter.  CCM Care Plan  No Known Allergies  Outpatient Encounter Medications as of 04/27/2022  Medication Sig   aspirin 81 MG tablet Take 81 mg by mouth daily. (Patient not taking: Reported on 03/13/2022)   atorvastatin (LIPITOR) 40 MG tablet TAKE 1 TABLET(40 MG) BY MOUTH DAILY. STOP SIMVASTATIN   Blood Glucose Monitoring Suppl (ONE TOUCH ULTRA 2) w/Device KIT Use glucometer to test sugars once daily and as needed. Dx E11.9 (Patient not taking: Reported on 03/13/2022)   Calcium Carbonate (CALCIUM 500 PO) Take by mouth 2 (two) times  daily. (Patient not taking: Reported on 03/13/2022)   Ginger, Zingiber officinalis, (GINGER ROOT PO) Take by mouth. (Patient not taking: Reported on 03/13/2022)   glucose blood (ONETOUCH ULTRA) test strip Use to check sugar daily and as needed.  Dx Code: E11.9 (Patient not taking: Reported on 03/13/2022)   metFORMIN (GLUCOPHAGE) 500 MG tablet TAKE 1 TABLET(500 MG) BY MOUTH DAILY WITH BREAKFAST   Multiple Vitamin (MULTIVITAMIN) capsule Take 1 capsule by mouth daily. (Patient not taking: Reported on 03/13/2022)   OneTouch Delica Lancets 33G MISC Use as directed once daily and as needed.  Dx Code: E11.9 (Patient not taking: Reported on 03/13/2022)   vitamin C (ASCORBIC ACID) 500 MG tablet Take 1,000 mg by mouth 2 (two) times daily. (Patient not taking: Reported on 03/13/2022)   No facility-administered encounter medications on file as of 04/27/2022.    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  Corporate treasurer, Limited Social Support, Transportation, Limited Access to [employer], Level of Care Concerns, ADL/IADL Limitations, Mental  Health Concerns, Social Isolation, Limited Access to Caregiver, Cognitive Deficits, Memory Deficits, and Lacks Knowledge of [employer].  Care Plan : LCSW Plan of Care  Updates made by Karolee Stamps, LCSW since 04/27/2022 12:00 AM     Problem: Find Help in My Community. Resolved 04/27/2022  Priority: High     Goal: Find Help in My Community. Completed 04/27/2022  Start Date: 03/23/2022  Expected End Date: 04/27/2022  This Visit's Progress: On track  Recent Progress: On track  Priority: High  Note:   Current Barriers:   Financial constraints related to only receiving Social Security Income. Limited social support. Lack of transportation to and from physician appointments. Level of care concerns due to diagnosis of Dementia. Limited ability to purchase food and nutritional supplements. Limited access to caregiver and family support. Lacks knowledge of community resources. Clinical Goals:  Patient's daughter will work with LCSW, to address needs related to level of care concerns, transportation issues, financial difficulties, food insecurities, etc.   Interventions:  Collaboration with Primary Care Physician, Dr. Danise Edge, regarding development and update of comprehensive plan of care, as evidenced by provider attestation and co-signature. Inter-disciplinary care team collaboration (see longitudinal plan of care). Reviewed various financial resources, discussed options, and provided patient's daughter with information on how to apply for Adult Medicaid, through the [facility] Department of [facility]. Reviewed various transportation resources, discussed options, and provided patient's daughter with information about how to apply for Scientist, clinical (histocompatibility and immunogenetics) and [employer]. Reviewed various emergency assistance resources, discussed options, and provided patient's daughter with information about how to apply for  emergency financial assistance. Reviewed various long-term memory care assisted living facilities, discussed options, and provided patient's daughter with a complete list of facilities.     Referral placed to [employer] Guide for community support, assistance, resources, and other options.  Clinical Interventions: Advised patient's daughter to begin contacting agencies and resources of interest.  Solution-Focused Strategies implemented. Active Listening/Reflection utilized. Emotional Support provided. Behavioral Activation implemented. Problem Solving/Task-Centered Solutions established. Patient Goals/Self-Care Activities:  Daughter will complete application for Adult Medicaid and submit to the [facility] of Social Services for processing. Daughter will complete Part A & B of [employer] Midwife) application, fax to [employer] for review and signature, then LCSW will submit to the Department of Transportation for processing.   Daughter will begin utilizing your transportation benefit through [employer]. Daughter will review complete list of transportation options, and begin utilizing agencies of interest. Daughter will apply for financial assistance, through [facility], as well as Engineer, drilling. Daughter will consider enrolling you in an Adult Day Care Program from the list provided. Daughter will begin touring Long-Term Memory Care Assisted Living Facilities, for consideration of placement. Daughter will complete application for Food Stamps, and submit to the [facility] of Social Services for processing. LCSW continued collaboration with [employer], to place you on the waiting list to receive prepared meal delivery services. Daughter will complete application for Personal Care Services, if  approved for Medicaid, and submit to Primary Care Physician, Dr. Danise Edge for review and signature, then Dr. Mariel Aloe nurse will fax to [facility] for processing. Daughter will contact LCSW directly (# [phone]), if she has  questions, needs assistance, or if additional social work needs are identified in the near future.   No Follow-Up Required.    Danford Bad LCSW Licensed Clinical Social Worker [facility] [employer] [phone]

## 2022-05-21 ENCOUNTER — Ambulatory Visit: Payer: Medicare Other | Admitting: Physician Assistant

## 2022-05-28 ENCOUNTER — Ambulatory Visit
Admission: RE | Admit: 2022-05-28 | Discharge: 2022-05-28 | Disposition: A | Discharge status: Home / Self Care | Payer: Medicare Other | Source: Ambulatory Visit | Attending: Physician Assistant | Admitting: Physician Assistant

## 2022-05-28 DIAGNOSIS — I619 Nontraumatic intracerebral hemorrhage, unspecified: Secondary | ICD-10-CM | POA: Diagnosis not present

## 2022-05-28 DIAGNOSIS — J341 Cyst and mucocele of nose and nasal sinus: Secondary | ICD-10-CM | POA: Diagnosis not present

## 2022-05-28 DIAGNOSIS — S0083XA Contusion of other part of head, initial encounter: Secondary | ICD-10-CM | POA: Diagnosis not present

## 2022-05-28 DIAGNOSIS — R413 Other amnesia: Secondary | ICD-10-CM | POA: Diagnosis not present

## 2022-05-28 NOTE — Progress Notes (Signed)
Please inform patient his MRI brain has normal volume for age, there are some age related vascular changes, recommend daily baby aspirin

## 2022-08-29 ENCOUNTER — Ambulatory Visit: Payer: Medicare Other

## 2022-09-07 ENCOUNTER — Other Ambulatory Visit: Payer: Self-pay | Admitting: Family

## 2022-09-07 ENCOUNTER — Other Ambulatory Visit: Payer: Self-pay | Admitting: Family Medicine

## 2023-01-04 ENCOUNTER — Other Ambulatory Visit (HOSPITAL_BASED_OUTPATIENT_CLINIC_OR_DEPARTMENT_OTHER): Payer: Self-pay

## 2023-01-27 ENCOUNTER — Telehealth: Payer: Self-pay | Admitting: Family

## 2023-01-27 ENCOUNTER — Encounter: Payer: Medicare Other | Admitting: Family

## 2023-01-27 ENCOUNTER — Ambulatory Visit (INDEPENDENT_AMBULATORY_CARE_PROVIDER_SITE_OTHER): Payer: Medicare Other | Admitting: Family

## 2023-01-27 ENCOUNTER — Ambulatory Visit (HOSPITAL_BASED_OUTPATIENT_CLINIC_OR_DEPARTMENT_OTHER)
Admission: RE | Admit: 2023-01-27 | Discharge: 2023-01-27 | Disposition: A | Discharge status: Home / Self Care | Payer: Medicare Other | Source: Ambulatory Visit | Attending: Family | Admitting: Family

## 2023-01-27 VITALS — BP 114/67 | HR 89 | Temp 97.6°F | Resp 16 | Ht 67.0 in | Wt 129.0 lb

## 2023-01-27 DIAGNOSIS — R918 Other nonspecific abnormal finding of lung field: Secondary | ICD-10-CM

## 2023-01-27 DIAGNOSIS — F03A0 Unspecified dementia, mild, without behavioral disturbance, psychotic disturbance, mood disturbance, and anxiety: Secondary | ICD-10-CM

## 2023-01-27 DIAGNOSIS — Z125 Encounter for screening for malignant neoplasm of prostate: Secondary | ICD-10-CM | POA: Diagnosis not present

## 2023-01-27 DIAGNOSIS — E119 Type 2 diabetes mellitus without complications: Secondary | ICD-10-CM

## 2023-01-27 DIAGNOSIS — R634 Abnormal weight loss: Secondary | ICD-10-CM | POA: Diagnosis not present

## 2023-01-27 DIAGNOSIS — Z0001 Encounter for general adult medical examination with abnormal findings: Secondary | ICD-10-CM

## 2023-01-27 DIAGNOSIS — Z0000 Encounter for general adult medical examination without abnormal findings: Secondary | ICD-10-CM

## 2023-01-27 DIAGNOSIS — E785 Hyperlipidemia, unspecified: Secondary | ICD-10-CM

## 2023-01-27 DIAGNOSIS — R972 Elevated prostate specific antigen [PSA]: Secondary | ICD-10-CM | POA: Insufficient documentation

## 2023-01-27 DIAGNOSIS — R0989 Other specified symptoms and signs involving the circulatory and respiratory systems: Secondary | ICD-10-CM | POA: Diagnosis not present

## 2023-01-27 DIAGNOSIS — H9193 Unspecified hearing loss, bilateral: Secondary | ICD-10-CM

## 2023-01-27 LAB — LIPID PANEL
Cholesterol: 173 mg/dL (ref 0–200)
HDL: 30.5 mg/dL — ABNORMAL LOW (ref >39.00)
LDL Cholesterol: 117 mg/dL — ABNORMAL HIGH (ref 0–99)
NonHDL: 142.38
Total CHOL/HDL Ratio: 6
Triglycerides: 125 mg/dL (ref 0.0–149.0)
VLDL: 25 mg/dL (ref 0.0–40.0)

## 2023-01-27 LAB — MICROALBUMIN / CREATININE URINE RATIO
Creatinine,U: 239.7 mg/dL
Microalb Creat Ratio: 0.7 mg/g (ref 0.0–30.0)
Microalb, Ur: 1.8 mg/dL (ref 0.0–1.9)

## 2023-01-27 LAB — COMPREHENSIVE METABOLIC PANEL
ALT: 14 U/L (ref 0–53)
AST: 11 U/L (ref 0–37)
Albumin: 4 g/dL (ref 3.5–5.2)
Alkaline Phosphatase: 104 U/L (ref 39–117)
BUN: 13 mg/dL (ref 6–23)
CO2: 25 mEq/L (ref 19–32)
Calcium: 9.2 mg/dL (ref 8.4–10.5)
Chloride: 103 mEq/L (ref 96–112)
Creatinine, Ser: 0.89 mg/dL (ref 0.40–1.50)
GFR: 80.14 mL/min (ref >60.00)
Glucose, Bld: 167 mg/dL — ABNORMAL HIGH (ref 70–99)
Potassium: 4 mEq/L (ref 3.5–5.1)
Sodium: 135 mEq/L (ref 135–145)
Total Bilirubin: 0.7 mg/dL (ref 0.2–1.2)
Total Protein: 7.9 g/dL (ref 6.0–8.3)

## 2023-01-27 LAB — PSA, MEDICARE: PSA: 5.7 ng/ml — ABNORMAL HIGH (ref 0.10–4.00)

## 2023-01-27 LAB — HEMOGLOBIN A1C: Hgb A1c MFr Bld: 7.8 % — ABNORMAL HIGH (ref 4.6–6.5)

## 2023-01-27 MED ORDER — SHINGRIX 50 MCG/0.5ML IM SUSR: 0.5000 mL | Freq: Once | INTRAMUSCULAR | 0 refills | Status: AC

## 2023-01-27 MED ORDER — METFORMIN HCL 500 MG PO TABS: 500.0000 mg | ORAL_TABLET | Freq: Two times a day (BID) | ORAL | 1 refills | Status: DC

## 2023-01-27 NOTE — Patient Instructions (Addendum)
Please consider getting a covid booster

## 2023-01-27 NOTE — Assessment & Plan Note (Signed)
Stable per daughter. Continues to follow with neurology.

## 2023-01-27 NOTE — Assessment & Plan Note (Addendum)
Lab Results  Component Value Date   HGBA1C 7.9 (H) 03/13/2022   HGBA1C 8.3 (H) 02/27/2021   HGBA1C 7.2 (H) 08/15/2020   Lab Results  Component Value Date   MICROALBUR 1.6 02/27/2021   LDLCALC 70 02/27/2021   CREATININE 1.01 03/13/2022   Last A1C was above goal.

## 2023-01-27 NOTE — Telephone Encounter (Signed)
Please advise daughter that patient's PSA is elevated.  I would like him to meet with urology.   Sugar is elevated. Please increase metformin to twice daily.

## 2023-01-27 NOTE — Assessment & Plan Note (Signed)
New.  Daughter reports that his diet overall has been better as he no longer eats fast food. She notices that his a

## 2023-01-27 NOTE — Progress Notes (Signed)
Subjective:   By signing my name below, I, Marvin Mcdonald, attest that this documentation has been prepared under the direction and in the presence of [name], NP. 01/27/2023   Patient ID: Marvin Mcdonald, male    DOB: 1940-12-28, 82 y.o.   MRN: [id number]  Chief Complaint  Patient presents with   Annual Exam    HPI Patient is in today for a comprehensive physical exam. He is present with his daughter during this visit.   Memory: His daughter reports patients memory has not improved since last visit. He is diagnosed with dementia. He has seen a neurologist in the past but has not followed up with them. He currently lives alone at home and is managing well at this time. He has family members that visit him at least 2 times weekly to provider care and food.   Hearing: His daughter reports his hearing has decreased.   Weight: Patient has lost weight. His daughter reports his appetite has noticeably decreased. She notes he used to eat fast foods frequently when he was able to drive but stopped since he stopped driving.  Wt Readings from Last 3 Encounters:  01/27/23 129 lb (58.5 kg)  04/09/22 144 lb (65.3 kg)  03/13/22 139 lb (63 kg)   Acute: He denies fever, unexpected weight change, adenopathy, new moles, sinus pain, sore throat, visual disturbance, chest pain, palpitations, leg swelling, cough, shortness of breath, wheezing, nausea, vomiting, diarrhea, constipation, blood in stool, dysuria, frequency, hematuria, new muscle pain, new joint pain, headaches, depression or anxiety at this time.   Social history: He has a history of smoking.   Immunizations: He does not have the latest Covid-19 booster vaccine. He has never received the shingles vaccine and is interested in receiving it at his pharmacy.   Diet: He is eating bread often and cut down on fast foods.   Exercise: He is not participating in regular exercise.   Colonoscopy: Last completed 08/11/2011. No repeat.    PSA: Last completed 08/15/2020. Results showed 3.25.   Dental: He needs a new dentist closer to his home.   Vision: He is due for vision care. He needs a provider for vision care.    Past Medical History:  Diagnosis Date   Acute URI 01/03/2013   BPH (benign prostatic hypertrophy)    Colon cancer screening 04/01/2017   Colon polyps    Dental infection 10/07/2013   Diabetes mellitus    diet and exercise controlled   Hematuria 04/07/2013   Hyperlipidemia    Low back pain    Mass of neck 10/07/2013   Osteoporosis    Pain in joint, shoulder region 10/07/2013    Past Surgical History:  Procedure Laterality Date   COLONOSCOPY     HAND SURGERY     POLYPECTOMY      Family History  Problem Relation Age of Onset   Hypertension Sister    Hypertension Brother    Depression Paternal Grandmother    Hypertension Brother    Hypertension Brother    Colon cancer Neg Hx    Stomach cancer Neg Hx    Esophageal cancer Neg Hx     Social History   Socioeconomic History   Marital status: Widowed    Spouse name: [place]   Number of children: 2   Years of education: 12   Highest education level: 12th grade  Occupational History    Employer: [employer]   Tobacco Use   Smoking status: Former  Packs/day: 0.50    Years: 25.00    Additional pack years: 0.00    Total pack years: 12.50    Types: Cigarettes    Quit date: 07/27/1996    Years since quitting: 26.5    Passive exposure: Past   Smokeless tobacco: Never   Tobacco comments:    Verified by Daughter, [place]  Vaping Use   Vaping Use: Never used  Substance and Sexual Activity   Alcohol use: No    Alcohol/week: 0.0 standard drinks of alcohol   Drug use: No   Sexual activity: Not Currently  Other Topics Concern   Not on file  Social History Narrative   Supportive daughter   Right handed   Drinks caffeine   One story home   Social Determinants of Health   Financial Resource Strain: Low Risk  (03/25/2022)    Overall Financial Resource Strain (CARDIA)    Difficulty of Paying Living Expenses: Not very hard  Food Insecurity: No Food Insecurity (03/25/2022)   Hunger Vital Sign    Worried About Running Out of Food in the Last Year: Never true    Ran Out of Food in the Last Year: Never true  Transportation Needs: No Transportation Needs (03/25/2022)   PRAPARE - Hydrologist (Medical): No    Lack of Transportation (Non-Medical): No  Physical Activity: Inactive (03/25/2022)   Exercise Vital Sign    Days of Exercise per Week: 0 days    Minutes of Exercise per Session: 0 min  Stress: No Stress Concern Present (03/25/2022)   [place]    Feeling of Stress : Only a little  Social Connections: Moderately Integrated (03/25/2022)   Social Connection and Isolation Panel [NHANES]    Frequency of Communication with Friends and Family: More than three times a week    Frequency of Social Gatherings with Friends and Family: More than three times a week    Attends Religious Services: More than 4 times per year    Active Member of [employer] or Organizations: No    Attends Archivist Meetings: Never    Marital Status: Married  Human resources officer Violence: Not At Risk (03/25/2022)   Humiliation, Afraid, Rape, and Kick questionnaire    Fear of Current or Ex-Partner: No    Emotionally Abused: No    Physically Abused: No    Sexually Abused: No    Outpatient Medications Prior to Visit  Medication Sig Dispense Refill   aspirin 81 MG tablet Take 81 mg by mouth daily. (Patient not taking: Reported on 03/13/2022)     atorvastatin (LIPITOR) 40 MG tablet Take 1 tablet (40 mg total) by mouth daily. 30 tablet 0   Blood Glucose Monitoring Suppl (ONE TOUCH ULTRA 2) w/Device KIT Use glucometer to test sugars once daily and as needed. Dx E11.9 (Patient not taking: Reported on 03/13/2022) 1 kit 0   Calcium Carbonate (CALCIUM 500 PO)  Take by mouth 2 (two) times daily. (Patient not taking: Reported on 03/13/2022)     Ginger, Zingiber officinalis, (GINGER ROOT PO) Take by mouth. (Patient not taking: Reported on 03/13/2022)     glucose blood (ONETOUCH ULTRA) test strip Use to check sugar daily and as needed.  Dx Code: E11.9 (Patient not taking: Reported on 03/13/2022) 100 each 5   metFORMIN (GLUCOPHAGE) 500 MG tablet TAKE 1 TABLET(500 MG) BY MOUTH DAILY WITH BREAKFAST 180 tablet 1   Multiple Vitamin (MULTIVITAMIN) capsule Take  1 capsule by mouth daily. (Patient not taking: Reported on 03/13/2022)     OneTouch Delica Lancets 99991111 MISC Use as directed once daily and as needed.  Dx Code: E11.9 (Patient not taking: Reported on 03/13/2022) 100 each 5   vitamin C (ASCORBIC ACID) 500 MG tablet Take 1,000 mg by mouth 2 (two) times daily. (Patient not taking: Reported on 03/13/2022)     No facility-administered medications prior to visit.    No Known Allergies  Review of Systems  Constitutional:  Negative for fever.       (-)unexpected weight change (-)Adenopathy  HENT:  Positive for hearing loss. Negative for congestion, sinus pain and sore throat.   Eyes:        (-)Visual disturbance  Respiratory:  Negative for cough, shortness of breath and wheezing.   Cardiovascular:  Negative for chest pain, palpitations and leg swelling.  Gastrointestinal:  Negative for blood in stool, constipation, diarrhea, nausea and vomiting.  Genitourinary:  Negative for dysuria, frequency and hematuria.  Musculoskeletal:        (-)new muscle pain (-)new joint pain  Skin:        (-)new moles  Neurological:  Negative for dizziness and headaches.  Psychiatric/Behavioral:  Negative for depression. The patient is not nervous/anxious.        Objective:    Physical Exam Constitutional:      General: He is not in acute distress.    Appearance: Normal appearance. He is not ill-appearing.  HENT:     Head: Normocephalic and atraumatic.     Right Ear:  Tympanic membrane, ear canal and external ear normal.     Left Ear: Tympanic membrane, ear canal and external ear normal.     Mouth/Throat:     Mouth: Mucous membranes are moist.     Pharynx: Oropharynx is clear. No oropharyngeal exudate or posterior oropharyngeal erythema.  Eyes:     Extraocular Movements: Extraocular movements intact.     Right eye: No nystagmus.     Left eye: No nystagmus.     Pupils: Pupils are equal, round, and reactive to light.  Cardiovascular:     Rate and Rhythm: Normal rate and regular rhythm.     Heart sounds: Normal heart sounds. No murmur heard.    No gallop.  Pulmonary:     Effort: Pulmonary effort is normal. No respiratory distress.     Breath sounds: Rales (base of right lung) present. No wheezing.  Abdominal:     General: Bowel sounds are normal. There is no distension.     Palpations: Abdomen is soft.     Tenderness: There is no abdominal tenderness. There is no guarding.  Musculoskeletal:     Comments: 5/5 grip strength  Skin:    General: Skin is warm and dry.  Neurological:     Mental Status: He is alert and oriented to person, place, and time.     Deep Tendon Reflexes:     Reflex Scores:      Patellar reflexes are 2+ on the right side and 2+ on the left side. Psychiatric:        Judgment: Judgment normal.     BP 114/67 (BP Location: Right Arm, Patient Position: Sitting, Cuff Size: Small)   Pulse 89   Temp 97.6 F (36.4 C) (Oral)   Resp 16   Ht 5\' 7"  (1.702 m)   Wt 129 lb (58.5 kg)   SpO2 97%   BMI 20.20 kg/m  Wt Readings from Last  3 Encounters:  01/27/23 129 lb (58.5 kg)  04/09/22 144 lb (65.3 kg)  03/13/22 139 lb (63 kg)       Assessment & Plan:  Preventative health care  Controlled type 2 diabetes mellitus without complication, unspecified whether long term insulin use (HCC) Assessment & Plan: Lab Results  Component Value Date   HGBA1C 7.9 (H) 03/13/2022   HGBA1C 8.3 (H) 02/27/2021   HGBA1C 7.2 (H) 08/15/2020   Lab  Results  Component Value Date   MICROALBUR 1.6 02/27/2021   LDLCALC 70 02/27/2021   CREATININE 1.01 03/13/2022   Last A1C was above goal.  Orders: -     Microalbumin / creatinine urine ratio -     Hemoglobin A1c -     Comprehensive metabolic panel -     Ambulatory referral to Ophthalmology  Hyperlipidemia, unspecified hyperlipidemia type -     Lipid panel  Screening for prostate cancer -     PSA, Medicare  Bilateral hearing loss, unspecified hearing loss type -     Ambulatory referral to Audiology  Lung field abnormal finding on examination -     DG Chest 2 View; Future  Mild dementia without behavioral disturbance, psychotic disturbance, mood disturbance, or anxiety, unspecified dementia type ([place]) Assessment & Plan: Stable per daughter. Continues to follow with neurology.    Other orders -     Shingrix; Inject 0.5 mLs into the muscle once for 1 dose.  Dispense: 0.5 mL; Refill: 0  Addendum- reviewed CXR results with daughter noting mass RLL.  Advised on need for CT scan and referral to pulmonology.  I, Nance Pear, NP, personally preformed the services described in this documentation.  All medical record entries made by the scribe were at my direction and in my presence.  I have reviewed the chart and discharge instructions (if applicable) and agree that the record reflects my personal performance and is accurate and complete. 01/27/2023   I,Marvin Mcdonald,acting as a scribe for Nance Pear, NP.,have documented all relevant documentation on the behalf of Nance Pear, NP,as directed by  Nance Pear, NP while in the presence of Nance Pear, NP.   Nance Pear, NP

## 2023-01-28 NOTE — Telephone Encounter (Signed)
Patient's daughter notified of results and provider's comments. She verbalized undrestanding

## 2023-02-03 ENCOUNTER — Ambulatory Visit (HOSPITAL_BASED_OUTPATIENT_CLINIC_OR_DEPARTMENT_OTHER)
Admission: RE | Admit: 2023-02-03 | Discharge: 2023-02-03 | Disposition: A | Discharge status: Home / Self Care | Payer: Medicare Other | Source: Ambulatory Visit | Attending: Family | Admitting: Family

## 2023-02-03 DIAGNOSIS — R918 Other nonspecific abnormal finding of lung field: Secondary | ICD-10-CM | POA: Diagnosis not present

## 2023-02-03 DIAGNOSIS — R911 Solitary pulmonary nodule: Secondary | ICD-10-CM | POA: Diagnosis not present

## 2023-02-06 ENCOUNTER — Telehealth: Payer: Self-pay | Admitting: Family

## 2023-02-06 MED ORDER — CEFPODOXIME PROXETIL 200 MG PO TABS: 200.0000 mg | ORAL_TABLET | Freq: Two times a day (BID) | ORAL | 0 refills | Status: DC

## 2023-02-06 MED ORDER — AZITHROMYCIN 250 MG PO TABS: ORAL_TABLET | ORAL | 0 refills | Status: DC

## 2023-02-06 NOTE — Telephone Encounter (Signed)
Reviewed CT scan- ? Infection versus lung mass.  Attempted to reach daughter- got voicemail. Left message advising her to review patient's mychart for further details.

## 2023-02-08 ENCOUNTER — Other Ambulatory Visit (HOSPITAL_BASED_OUTPATIENT_CLINIC_OR_DEPARTMENT_OTHER): Payer: Self-pay

## 2023-02-08 ENCOUNTER — Other Ambulatory Visit: Payer: Self-pay

## 2023-02-08 ENCOUNTER — Other Ambulatory Visit: Payer: Self-pay | Admitting: Family Medicine

## 2023-02-08 ENCOUNTER — Telehealth: Payer: Self-pay | Admitting: Family Medicine

## 2023-02-08 MED ORDER — ATORVASTATIN CALCIUM 40 MG PO TABS
40.0000 mg | ORAL_TABLET | Freq: Every day | ORAL | 0 refills | Status: DC
  Filled 2023-02-08: qty 30, 30d supply, fill #0

## 2023-02-08 MED ORDER — METFORMIN HCL 500 MG PO TABS
500.0000 mg | ORAL_TABLET | Freq: Two times a day (BID) | ORAL | 1 refills | Status: DC
  Filled 2023-02-08: qty 180, 90d supply, fill #0

## 2023-02-08 MED ORDER — CEFPODOXIME PROXETIL 200 MG PO TABS
200.0000 mg | ORAL_TABLET | Freq: Two times a day (BID) | ORAL | 0 refills | Status: DC
  Filled 2023-02-08 (×3): qty 14, 7d supply, fill #0

## 2023-02-08 MED ORDER — CEFPODOXIME PROXETIL 200 MG PO TABS
200.0000 mg | ORAL_TABLET | Freq: Two times a day (BID) | ORAL | 0 refills | Status: DC
  Filled 2023-02-08: qty 14, 7d supply, fill #0

## 2023-02-08 MED ORDER — CEFPODOXIME PROXETIL 200 MG PO TABS: 200.0000 mg | ORAL_TABLET | Freq: Two times a day (BID) | ORAL | 0 refills | Status: DC

## 2023-02-08 MED ORDER — AZITHROMYCIN 250 MG PO TABS
ORAL_TABLET | ORAL | 0 refills | Status: AC
  Filled 2023-02-08 – 2023-02-10 (×2): qty 6, 5d supply, fill #0

## 2023-02-08 NOTE — Telephone Encounter (Signed)
Pt's daughter would like all of his prescriptions sent to the pharmacy downstairs. Also cx the ones sent on 4/6 and send them downstairs.   cefpodoxime (VANTIN) 200 MG tablet  zithromycin (ZITHROMAX) 250 MG tablet

## 2023-02-08 NOTE — Telephone Encounter (Signed)
Spoke to daughter and reviewed treatment plan, CT results and importance of keeping upcoming appointment with pulmonology. She states pt is doing well other than some mild voice hoarseness.  Appetite is fair.  She will reach out to [place] if his status changes in any way.

## 2023-02-08 NOTE — Telephone Encounter (Signed)
Pt daughter called she now needs Vantin resent downstairs. Rx sent.

## 2023-02-08 NOTE — Addendum Note (Signed)
Addended byConrad [place] D on: 02/08/2023 01:01 PM   Modules accepted: Orders

## 2023-02-08 NOTE — Telephone Encounter (Signed)
Patient's daughter called to speak to [facility] regarding the CT scan. She would like a call back. Please advise.

## 2023-02-08 NOTE — Telephone Encounter (Signed)
Medication sent downstairs

## 2023-02-08 NOTE — Telephone Encounter (Signed)
Pt's daughter called- Varney Baas is too expensive downstairs- she would like for it to be sent to [facility] on [address] to see if it is any cheaper. Rx sent.

## 2023-02-10 ENCOUNTER — Other Ambulatory Visit (HOSPITAL_BASED_OUTPATIENT_CLINIC_OR_DEPARTMENT_OTHER): Payer: Self-pay

## 2023-02-18 ENCOUNTER — Encounter: Payer: Self-pay | Admitting: Urology

## 2023-02-18 ENCOUNTER — Other Ambulatory Visit (HOSPITAL_BASED_OUTPATIENT_CLINIC_OR_DEPARTMENT_OTHER): Payer: Self-pay

## 2023-02-18 ENCOUNTER — Ambulatory Visit: Payer: Medicare Other | Admitting: Urology

## 2023-02-18 VITALS — BP 120/74 | HR 79 | Ht 67.0 in | Wt 128.0 lb

## 2023-02-18 DIAGNOSIS — R972 Elevated prostate specific antigen [PSA]: Secondary | ICD-10-CM

## 2023-02-18 LAB — URINALYSIS, ROUTINE W REFLEX MICROSCOPIC
Bilirubin, UA: NEGATIVE
Glucose, UA: NEGATIVE
Ketones, UA: NEGATIVE
Leukocytes,UA: NEGATIVE
Nitrite, UA: NEGATIVE
RBC, UA: NEGATIVE
Specific Gravity, UA: 1.03 (ref 1.005–1.030)
Urobilinogen, Ur: 0.2 mg/dL (ref 0.2–1.0)
pH, UA: 5.5 (ref 5.0–7.5)

## 2023-02-18 NOTE — Progress Notes (Signed)
Assessment: 1. Elevated PSA     Plan: I personally reviewed the patient's chart including provider notes, and lab results. Today I had a long discussion with the patient and his daughter regarding PSA and the rationale and controversies of prostate cancer early detection.  I advised them that his PSA is likely within the normal range for his age.  I also discussed the recommendations for discontinuing PSA testing for men over 82 years of age.  Patient and his daughter expressed his understanding of these issues. Return to office in 6 months for free and total PSA   Chief Complaint:  Chief Complaint  Patient presents with   Elevated PSA    History of Present Illness:  Marvin Mcdonald is a 82 y.o. male who is seen in consultation from Bradd Canary, MD for evaluation of elevated PSA. PSA results: 2/20 2.89 9/20 3.06 10/21 3.25 3/24 5.70  No history of prostatitis or UTIs.  No family history of prostate cancer.  No prior prostate biopsy. He does not have any lower urinary tract symptoms other than nocturia x 1.  No dysuria or gross hematuria. IPSS = 1 today.  The patient's daughter interpreted during the visit today.  Past Medical History:  Past Medical History:  Diagnosis Date   Acute URI 01/03/2013   BPH (benign prostatic hypertrophy)    Colon cancer screening 04/01/2017   Colon polyps    Dental infection 10/07/2013   Diabetes mellitus    diet and exercise controlled   Hematuria 04/07/2013   Hyperlipidemia    Low back pain    Mass of neck 10/07/2013   Osteoporosis    Pain in joint, shoulder region 10/07/2013    Past Surgical History:  Past Surgical History:  Procedure Laterality Date   COLONOSCOPY     HAND SURGERY     POLYPECTOMY      Allergies:  No Known Allergies  Family History:  Family History  Problem Relation Age of Onset   Hypertension Sister    Hypertension Brother    Depression Paternal Grandmother    Hypertension Brother    Hypertension Brother     Colon cancer Neg Hx    Stomach cancer Neg Hx    Esophageal cancer Neg Hx     Social History:  Social History   Tobacco Use   Smoking status: Former    Packs/day: 0.50    Years: 25.00    Additional pack years: 0.00    Total pack years: 12.50    Types: Cigarettes    Quit date: 07/27/1996    Years since quitting: 26.5    Passive exposure: Past   Smokeless tobacco: Never   Tobacco comments:    Verified by Daughter, Eun Toth  Vaping Use   Vaping Use: Never used  Substance Use Topics   Alcohol use: No    Alcohol/week: 0.0 standard drinks of alcohol   Drug use: No    Review of symptoms:  Constitutional:  Negative for unexplained weight loss, night sweats, fever, chills ENT:  Negative for nose bleeds, sinus pain, painful swallowing CV:  Negative for chest pain, shortness of breath, exercise intolerance, palpitations, loss of consciousness Resp:  Negative for cough, wheezing, shortness of breath GI:  Negative for nausea, vomiting, diarrhea, bloody stools GU:  Positives noted in HPI; otherwise negative for gross hematuria, dysuria, urinary incontinence Neuro:  Negative for seizures, poor balance, limb weakness, slurred speech Psych:  Negative for lack of energy, depression, anxiety Endocrine:  Negative for polydipsia, polyuria, symptoms of hypoglycemia (dizziness, hunger, sweating) Hematologic:  Negative for anemia, purpura, petechia, prolonged or excessive bleeding, use of anticoagulants  Allergic:  Negative for difficulty breathing or choking as a result of exposure to anything; no shellfish allergy; no allergic response (rash/itch) to materials, foods  Physical exam: BP 120/74   Pulse 79   Ht  (1.702 m)   Wt 128 lb (58.1 kg)   BMI 20.05 kg/m  GENERAL APPEARANCE:  Well appearing, well developed, well nourished, NAD HEENT: Atraumatic, Normocephalic, oropharynx clear. NECK: Supple without lymphadenopathy or thyromegaly. LUNGS: Clear to auscultation  bilaterally. HEART: Regular Rate and Rhythm without murmurs, gallops, or rubs. ABDOMEN: Soft, non-tender, No Masses. EXTREMITIES: Moves all extremities well.  Without clubbing, cyanosis, or edema. NEUROLOGIC:  Alert and oriented x 3, normal gait, CN II-XII grossly intact.  MENTAL STATUS:  Appropriate. BACK:  Non-tender to palpation.  No CVAT SKIN:  Warm, dry and intact.   GU: Penis:  circumcised Meatus: Normal Scrotum: normal, no masses Testis: normal without masses bilateral Epididymis: normal Prostate: 40 g, NT, no nodules Rectum: Normal tone,  no masses or tenderness   Results: U/A:  trace protein

## 2023-02-19 ENCOUNTER — Encounter (HOSPITAL_BASED_OUTPATIENT_CLINIC_OR_DEPARTMENT_OTHER): Payer: Self-pay | Admitting: Pulmonary Disease

## 2023-02-19 ENCOUNTER — Ambulatory Visit (HOSPITAL_BASED_OUTPATIENT_CLINIC_OR_DEPARTMENT_OTHER): Payer: Medicare Other | Admitting: Pulmonary Disease

## 2023-02-19 DIAGNOSIS — T17908A Unspecified foreign body in respiratory tract, part unspecified causing other injury, initial encounter: Secondary | ICD-10-CM

## 2023-02-19 DIAGNOSIS — R918 Other nonspecific abnormal finding of lung field: Secondary | ICD-10-CM | POA: Insufficient documentation

## 2023-02-19 NOTE — Progress Notes (Signed)
Subjective:   PATIENT ID: Marvin Mcdonald GENDER: male DOB: 01-29-1941, MRN: [id number]  Chief Complaint  Patient presents with   Consult    No difficulty breathing, no sob, no cough states he's fine     Reason for Visit: New consult for lung mass  Marvin Mcdonald is an 82 year old [place] speaking male with with mild Alzheimer's dementia, hearing loss, DM2, HTN, HLD, osteoporosis, latent TB s/p treatment who presents for evaluation for right lung mass.  Accompanied by his daughter. Patient and family declines interpretor  He was previously seen by Dr. 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 Daughter reports he did take medication for possible latent TB.  Today's visit was prompted by abnormal CXR ordered by PCP for right lower lobe mass. Follow-up CT with chronic scattered pleural calcifications and right nodular lower lobe mass with presence of air bronchograms  Lives alone at home independently. Ambulatory without assistance. Able to perform ADL. Daughter reports he does have some forgetfulness   Reports 15 lbs weight loss >12 months. Denies recent respiratory illness. Denies shortness of breath, cough or wheezing. Denies fevers/chills or night sweats or loss of appetite. Daughter reports sometimes he coughs after eating and may have difficult swallowing at times.  Social History: Former smoker. 13 pack years. Quit in 1997 Prior asbestos exposure  I have personally reviewed patient's past medical/family/social history, allergies, current medications.  Past Medical History:  Diagnosis Date   Acute URI 01/03/2013   BPH (benign prostatic hypertrophy)    Colon cancer screening 04/01/2017   Colon polyps    Dental infection 10/07/2013   Diabetes mellitus    diet and exercise controlled   Hematuria 04/07/2013   Hyperlipidemia    Low back pain    Mass of neck 10/07/2013   Osteoporosis    Pain in joint, shoulder region 10/07/2013      Family History  Problem Relation Age of Onset   Hypertension Sister    Hypertension Brother    Depression Paternal Grandmother    Hypertension Brother    Hypertension Brother    Colon cancer Neg Hx    Stomach cancer Neg Hx    Esophageal cancer Neg Hx      Social History   Occupational History    Employer: [employer]   Tobacco Use   Smoking status: Former    Packs/day: 0.50    Years: 25.00    Additional pack years: 0.00    Total pack years: 12.50    Types: Cigarettes    Quit date: 07/27/1996    Years since quitting: 26.5    Passive exposure: Past   Smokeless tobacco: Never   Tobacco comments:    Verified by Daughter, [place]  Vaping Use   Vaping Use: Never used  Substance and Sexual Activity   Alcohol use: No    Alcohol/week: 0.0 standard drinks of alcohol   Drug use: No   Sexual activity: Not Currently    No Known Allergies   Outpatient Medications Prior to Visit  Medication Sig Dispense Refill   atorvastatin (LIPITOR) 40 MG tablet Take 1 tablet (40 mg total) by mouth daily. *Need appointment for future refills.* 30 tablet 0   Blood Glucose Monitoring Suppl (ONE TOUCH ULTRA 2) w/Device KIT Use glucometer to test sugars once daily and as needed. Dx E11.9 1 kit 0   metFORMIN (GLUCOPHAGE) 500 MG tablet Take 1 tablet (500 mg total) by mouth  2 (two) times daily with a meal. 180 tablet 1   cefpodoxime (VANTIN) 200 MG tablet Take 1 tablet (200 mg total) by mouth 2 (two) times daily. (Patient not taking: Reported on 02/18/2023) 14 tablet 0   glucose blood (ONETOUCH ULTRA) test strip Use to check sugar daily and as needed.  Dx Code: E11.9 (Patient not taking: Reported on 03/13/2022) 100 each 5   OneTouch Delica Lancets 33G MISC Use as directed once daily and as needed.  Dx Code: E11.9 (Patient not taking: Reported on 03/13/2022) 100 each 5   No facility-administered medications prior to visit.    Review of Systems  Constitutional:  Positive for weight loss.  Negative for chills, diaphoresis, fever and malaise/fatigue.  HENT:  Negative for congestion.   Respiratory:  Negative for cough, hemoptysis, sputum production, shortness of breath and wheezing.   Cardiovascular:  Negative for chest pain, palpitations and leg swelling.     Objective:   Vitals:   02/19/23 0930  BP: 108/62  Pulse: 75  SpO2: 96%  Weight: 129 lb 6.6 oz (58.7 kg)  Height:  (1.702 m)   SpO2: 96 % O2 Device: None (Room air)  Physical Exam: General: Well-appearing, no acute distress, difficulty hearing HENT: [place], AT Eyes: EOMI, no scleral icterus Respiratory: Diminished right lower lobe. Right upper/middle and left lung clear to auscultation bilaterally.  No crackles, wheezing or rales Cardiovascular: RRR, -M/R/G, no JVD Extremities:-Edema,-tenderness Neuro: Awake and alert, CNII-XII grossly intact Psych: Normal mood, normal affect  Data Reviewed:  Imaging: CT 02/03/23 - chronic scattered pleural calcifications and right nodular lower lobe mass with presence of air bronchograms  PFT: None on file  Labs: CBC    Component Value Date/Time   WBC 7.1 02/27/2021 1144   RBC 3.98 (L) 02/27/2021 1144   HGB 13.4 02/27/2021 1144   HCT 38.8 (L) 02/27/2021 1144   PLT 181.0 02/27/2021 1144   MCV 97.4 02/27/2021 1144   MCH 34.2 (H) 08/15/2020 1033   MCHC 34.5 02/27/2021 1144   RDW 12.6 02/27/2021 1144   MONOABS 0.3 01/28/2007 0931   EOSABS 0.2 01/28/2007 0931   BASOSABS 0.0 01/28/2007 0931        Assessment & Plan:   Discussion: 82 year old [place] speaking male with with mild Alzheimer's dementia, hearing loss, DM2, HTN, HLD, osteoporosis, latent TB s/p treatment who presents for evaluation for right lung mass. Ddx includes infection, inflammation or malignancy. Discussed GOC in setting of patient's known dementia and age. Daughter unsure if they would pursue aggressive treatment if this is malignancy. We discussed tissue sampling via bronchoscopy if CT chest  demonstrates persistent findings. Daughter unsure if they wish to pursue this. She does report concerns for possible aspiration risk that may have lead to this.  Right lower lobe lung mass --Completed Cefpodoxime --ORDER CT Chest without contrast in 6 weeks --Swallow evaluation to rule out aspiration  Health Maintenance Immunization History  Administered Date(s) Administered   Fluad Quad(high Dose 65+) 07/13/2019   Influenza Split 08/12/2016   Influenza Whole 08/18/2004, 07/23/2009, 07/18/2010   Influenza, High Dose Seasonal PF 08/01/2020   Influenza,inj,Quad PF,6+ Mos 07/18/2017, 08/07/2018, 07/14/2020, 08/29/2022   PFIZER(Purple Top)SARS-COV-2 Vaccination 11/23/2019, 12/14/2019   Pneumococcal Conjugate-13 10/05/2013   Pneumococcal Polysaccharide-23 07/21/2004, 10/13/2016   Tdap 05/05/2011   Zoster, Live 04/02/2006   CT Lung Screen - not qualified  Orders Placed This Encounter  Procedures   CT Chest Wo Contrast    Standing Status:   Future  Standing Expiration Date:   02/19/2024    Scheduling Instructions:     ORDER CT Chest for Mar 17 2023 or after. Will need clinic visit after CT    Order Specific Question:   Preferred imaging location?    Answer:   [facility]   SLP clinical swallow evaluation    Standing Status:   Future    Standing Expiration Date:   02/19/2024  No orders of the defined types were placed in this encounter.   Return in about 6 weeks (around 04/02/2023). After CT scan  I have spent a total time of 45-minutes on the day of the appointment reviewing prior documentation, coordinating care and discussing medical diagnosis and plan with the patient/family. Imaging, labs and tests included in this note have been reviewed and interpreted independently by me.  Edmond Ginsberg Mechele Collin, MD  Pulmonary Critical Care 02/19/2023

## 2023-02-19 NOTE — Patient Instructions (Signed)
Right lower lobe lung mass --Completed Cefpodoxime --ORDER CT Chest without contrast in 6 weeks

## 2023-03-04 DIAGNOSIS — E113293 Type 2 diabetes mellitus with mild nonproliferative diabetic retinopathy without macular edema, bilateral: Secondary | ICD-10-CM | POA: Diagnosis not present

## 2023-03-04 DIAGNOSIS — H40013 Open angle with borderline findings, low risk, bilateral: Secondary | ICD-10-CM | POA: Diagnosis not present

## 2023-03-04 DIAGNOSIS — H04123 Dry eye syndrome of bilateral lacrimal glands: Secondary | ICD-10-CM | POA: Diagnosis not present

## 2023-03-04 DIAGNOSIS — D23112 Other benign neoplasm of skin of right lower eyelid, including canthus: Secondary | ICD-10-CM | POA: Diagnosis not present

## 2023-03-04 DIAGNOSIS — H524 Presbyopia: Secondary | ICD-10-CM | POA: Diagnosis not present

## 2023-03-04 LAB — HM DIABETES EYE EXAM

## 2023-03-17 ENCOUNTER — Ambulatory Visit (HOSPITAL_BASED_OUTPATIENT_CLINIC_OR_DEPARTMENT_OTHER)
Admission: RE | Admit: 2023-03-17 | Discharge: 2023-03-17 | Disposition: A | Discharge status: Home / Self Care | Payer: Medicare Other | Source: Ambulatory Visit | Attending: Pulmonary Disease | Admitting: Pulmonary Disease

## 2023-03-17 DIAGNOSIS — R918 Other nonspecific abnormal finding of lung field: Secondary | ICD-10-CM | POA: Diagnosis not present

## 2023-03-17 DIAGNOSIS — J90 Pleural effusion, not elsewhere classified: Secondary | ICD-10-CM | POA: Diagnosis not present

## 2023-03-24 ENCOUNTER — Ambulatory Visit (HOSPITAL_BASED_OUTPATIENT_CLINIC_OR_DEPARTMENT_OTHER): Payer: Medicare Other | Admitting: Pulmonary Disease

## 2023-03-24 ENCOUNTER — Encounter (HOSPITAL_BASED_OUTPATIENT_CLINIC_OR_DEPARTMENT_OTHER): Payer: Self-pay | Admitting: Pulmonary Disease

## 2023-03-24 VITALS — BP 104/60 | HR 84 | Temp 98.0°F | Ht 67.0 in | Wt 124.0 lb

## 2023-03-24 DIAGNOSIS — R918 Other nonspecific abnormal finding of lung field: Secondary | ICD-10-CM | POA: Diagnosis not present

## 2023-03-24 DIAGNOSIS — R1310 Dysphagia, unspecified: Secondary | ICD-10-CM | POA: Diagnosis not present

## 2023-03-24 NOTE — Progress Notes (Signed)
Subjective:   PATIENT ID: Marvin Mcdonald GENDER: male DOB: 1941-03-02, MRN: [id number]  Chief Complaint  Patient presents with   Follow-up    Follow up. Patient is here for results of CT.     Reason for Visit: Follow-up for lung mass  [name] is an 82 year old [place] speaking male with with mild Alzheimer's dementia, hearing loss, DM2, HTN, HLD, osteoporosis, latent TB s/p treatment who presents for evaluation for right lung mass.  Accompanied by his daughter.  Patient and family declines interpreter  Initial consult He was previously seen by Dr. 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 Daughter reports he did take medication for possible latent TB.  Today's visit was prompted by abnormal CXR ordered by PCP for right lower lobe mass. Follow-up CT with chronic scattered pleural calcifications and right nodular lower lobe mass with presence of air bronchograms  Lives alone at home independently. Ambulatory without assistance. Able to perform ADL. Daughter reports he does have some forgetfulness   Reports 15 lbs weight loss >12 months. Denies recent respiratory illness. Denies shortness of breath, cough or wheezing. Denies fevers/chills or night sweats or loss of appetite. Daughter reports sometimes he coughs after eating and may have difficult swallowing at times.  03/24/2023 Since our visit, no clinical changes.  No respiratory symptoms, denies shortness of breath, cough or wheezing except for cough after eating.  Has not had swallow evaluation.  That was not ordered on last visit.  After completing antibiotic course patient had repeat CT on 03/17/2023.  Social History: Former smoker. 13 pack years. Quit in 1997 Prior asbestos exposure  Past Medical History:  Diagnosis Date   Acute URI 01/03/2013   BPH (benign prostatic hypertrophy)    Colon cancer screening 04/01/2017   Colon polyps    Dental infection 10/07/2013    Diabetes mellitus    diet and exercise controlled   Hematuria 04/07/2013   Hyperlipidemia    Low back pain    Mass of neck 10/07/2013   Osteoporosis    Pain in joint, shoulder region 10/07/2013     Family History  Problem Relation Age of Onset   Hypertension Sister    Hypertension Brother    Depression Paternal Grandmother    Hypertension Brother    Hypertension Brother    Colon cancer Neg Hx    Stomach cancer Neg Hx    Esophageal cancer Neg Hx      Social History   Occupational History    Employer: [employer]   Tobacco Use   Smoking status: Former    Packs/day: 0.50    Years: 25.00    Additional pack years: 0.00    Total pack years: 12.50    Types: Cigarettes    Quit date: 07/27/1996    Years since quitting: 26.6    Passive exposure: Past   Smokeless tobacco: Never   Tobacco comments:    Verified by Daughter, [place]  Vaping Use   Vaping Use: Never used  Substance and Sexual Activity   Alcohol use: No    Alcohol/week: 0.0 standard drinks of alcohol   Drug use: No   Sexual activity: Not Currently    No Known Allergies   Outpatient Medications Prior to Visit  Medication Sig Dispense Refill   atorvastatin (LIPITOR) 40 MG tablet Take 1 tablet (40 mg total) by mouth daily. *Need appointment for future refills.* 30 tablet 0   Blood Glucose  Monitoring Suppl (ONE TOUCH ULTRA 2) w/Device KIT Use glucometer to test sugars once daily and as needed. Dx E11.9 1 kit 0   metFORMIN (GLUCOPHAGE) 500 MG tablet Take 1 tablet (500 mg total) by mouth 2 (two) times daily with a meal. 180 tablet 1   glucose blood (ONETOUCH ULTRA) test strip Use to check sugar daily and as needed.  Dx Code: E11.9 (Patient not taking: Reported on 03/13/2022) 100 each 5   OneTouch Delica Lancets 33G MISC Use as directed once daily and as needed.  Dx Code: E11.9 (Patient not taking: Reported on 03/13/2022) 100 each 5   No facility-administered medications prior to visit.    Review of Systems   Constitutional:  Positive for malaise/fatigue. Negative for chills, diaphoresis, fever and weight loss.  HENT:  Negative for congestion.   Respiratory:  Positive for cough. Negative for hemoptysis, sputum production, shortness of breath and wheezing.   Cardiovascular:  Negative for chest pain, palpitations and leg swelling.  Gastrointestinal:        Difficulty swallowing     Objective:   Vitals:   03/24/23 1029  BP: 104/60  Pulse: 84  Temp: 98 F (36.7 C)  TempSrc: Oral  SpO2: 97%  Weight: 56.2 kg  Height: 5\' 7"  (1.702 m)   SpO2: 97 % O2 Device: None (Room air)  Physical Exam: General: Well-appearing, no acute distress, difficulty hearing HENT: [place], AT Eyes: EOMI, no scleral icterus Respiratory: Diminished right lower lobe with remainder clear to auscultation bilaterally.  No crackles, wheezing or rales Cardiovascular: RRR, -M/R/G, no JVD Extremities:-Edema,-tenderness Neuro: AAO x4, CNII-XII grossly intact Psych: Normal mood, normal affect   Data Reviewed:  Imaging: CT 02/03/23 - chronic scattered pleural calcifications and right nodular lower lobe mass with presence of air bronchograms CT chest 03/17/2023-unchanged lobular consolidation with air bronchograms and chronic scattered pleural calcifications  PFT: None on file  Labs: CBC    Component Value Date/Time   WBC 7.1 02/27/2021 1144   RBC 3.98 (L) 02/27/2021 1144   HGB 13.4 02/27/2021 1144   HCT 38.8 (L) 02/27/2021 1144   PLT 181.0 02/27/2021 1144   MCV 97.4 02/27/2021 1144   MCH 34.2 (H) 08/15/2020 1033   MCHC 34.5 02/27/2021 1144   RDW 12.6 02/27/2021 1144   MONOABS 0.3 01/28/2007 0931   EOSABS 0.2 01/28/2007 0931   BASOSABS 0.0 01/28/2007 0931        Assessment & Plan:   Discussion: 82 year old [place] speaking male with with mild Alzheimer's dementia, hearing loss, DM2, HTN, HLD, osteoporosis, latent TB s/p treatment who presents for evaluation for right lung mass.We reviewed imaging which are  unchanged after antibiotic course. Ddx includes malignancy, inflammation and infection. We again discussed goals of care with patient's known dementia and age.  Daughter remains unsure if aggressive treatment would be in the best interest for her father.  Based on the location of the mass, would recommend navigational bronchoscopy if we wish to pursue tissue biopsy.  I addressed questions and concerns and after shared decision-making, daughter elected to pursue conservative management with imaging surveillance.  This could still be related to aspiration so we will pursue swallow evaluation.  Right lower lobe lung mass, unchanged. Cannot rule out malignancy. --Discussed risk and benefits of biopsy via bronchoscopy for tissue diagnosis --Discussed goals of care of whether we want to pursue biopsy or cancer treatment --Daughter is leaning for conservative options for now.  --Will order CT chest without contrast in 3 months --If  you wish to pursue biopsy in the future, please contact our office so we can arrange --Swallow evaluation to rule out aspiration  Health Maintenance Immunization History  Administered Date(s) Administered   Fluad Quad(high Dose 65+) 07/13/2019   Influenza Split 08/12/2016   Influenza Whole 08/18/2004, 07/23/2009, 07/18/2010   Influenza, High Dose Seasonal PF 08/01/2020   Influenza,inj,Quad PF,6+ Mos 07/18/2017, 08/07/2018, 07/14/2020, 08/29/2022   PFIZER(Purple Top)SARS-COV-2 Vaccination 11/23/2019, 12/14/2019   Pneumococcal Conjugate-13 10/05/2013   Pneumococcal Polysaccharide-23 07/21/2004, 10/13/2016   Tdap 05/05/2011   Zoster, Live 04/02/2006   CT Lung Screen - not qualified  Orders Placed This Encounter  Procedures   CT Chest Wo Contrast    Standing Status:   Future    Standing Expiration Date:   03/23/2024    Scheduling Instructions:     Schedule in 3 months in August 2024 with follow-up with Dr. Everardo All afterwards    Order Specific Question:   Preferred  imaging location?    Answer:   [facility]   SLP eval and treat    Dysphagia    Standing Status:   Future    Standing Expiration Date:   03/23/2024  No orders of the defined types were placed in this encounter.   Return in about 3 months (around 06/24/2023). After CT scan  I have spent a total time of 36-minutes on the day of the appointment including chart review, data review, collecting history, coordinating care and discussing medical diagnosis and plan with the patient/family. Past medical history, allergies, medications were reviewed. Pertinent imaging, labs and tests included in this note have been reviewed and interpreted independently by me.  Remington Skalsky Mechele Collin, MD [employer] Pulmonary Critical Care 03/24/2023

## 2023-03-24 NOTE — Patient Instructions (Signed)
Right lower lobe lung mass, unchanged. Cannot rule out malignancy. --Discussed risk and benefits of biopsy via bronchoscopy for tissue diagnosis --Discussed goals of care of whether we want to pursue biopsy or cancer treatment --Daughter is leaning for conservative options for now.  --Will order CT chest without contrast in 3 months --If you wish to pursue biopsy in the future, please contact our office so we can arrange --Swallow evaluation to rule out aspiration

## 2023-04-12 ENCOUNTER — Ambulatory Visit (HOSPITAL_BASED_OUTPATIENT_CLINIC_OR_DEPARTMENT_OTHER): Payer: Medicare Other | Admitting: Pulmonary Disease

## 2023-04-28 DIAGNOSIS — I700 Atherosclerosis of aorta: Secondary | ICD-10-CM | POA: Insufficient documentation

## 2023-04-28 NOTE — Assessment & Plan Note (Signed)
Tolerating statin, encouraged heart healthy diet, avoid trans fats, minimize simple carbs and saturated fats. Increase exercise as tolerated 

## 2023-04-28 NOTE — Assessment & Plan Note (Signed)
Well controlled, no changes to meds. Encouraged heart healthy diet such as the DASH diet and exercise as tolerated.  °

## 2023-04-28 NOTE — Progress Notes (Signed)
Subjective:    Patient ID: Marvin Mcdonald, male    DOB: 1941/01/29, 83 y.o.   MRN: [id number]  Chief Complaint  Patient presents with   Follow-up    Follow up    HPI Discussed the use of AI scribe software for clinical note transcription with the patient, who gave verbal consent to proceed.  History of Present Illness   The patient is an elderly male with a history of a lung mass, weight loss, and memory issues. His daughter, who is his primary caregiver, reports that he has been losing weight due to decreased appetite and often forgets to take his medications. She has been managing his care remotely but is considering moving him into her home for better oversight of his health. She reports that he does not complain of any chest pain or breathing difficulties. However, he notes that his activity level has decreased. The patient was recently seen by a pulmonologist, Dr. Everardo All, who ordered a CT scan of the chest. The scan revealed a lung mass, which is currently being managed with a watchful waiting approach. The patient was also prescribed antibiotics, but adherence has been a challenge due to his memory issues.    Patient is an 82 yo male in today for follow up on chronic medical concerns. No recent febrile illness or hospitalizations. Denies CP/palp/SOB/HA/congestion/fevers/GI or GU c/o. Taking meds as prescribed, no complaints of polyuria or polydipsia.     Past Medical History:  Diagnosis Date   Acute URI 01/03/2013   BPH (benign prostatic hypertrophy)    Colon cancer screening 04/01/2017   Colon polyps    Dental infection 10/07/2013   Diabetes mellitus    diet and exercise controlled   Hematuria 04/07/2013   Hyperlipidemia    Low back pain    Mass of neck 10/07/2013   Osteoporosis    Pain in joint, shoulder region 10/07/2013    Past Surgical History:  Procedure Laterality Date   COLONOSCOPY     HAND SURGERY     POLYPECTOMY      Family History  Problem Relation Age of Onset    Hypertension Sister    Hypertension Brother    Depression Paternal Grandmother    Hypertension Brother    Hypertension Brother    Colon cancer Neg Hx    Stomach cancer Neg Hx    Esophageal cancer Neg Hx     Social History   Socioeconomic History   Marital status: Widowed    Spouse name: Marvin Mcdonald   Number of children: 2   Years of education: 12   Highest education level: 12th grade  Occupational History    Employer: [employer]   Tobacco Use   Smoking status: Former    Packs/day: 0.50    Years: 25.00    Additional pack years: 0.00    Total pack years: 12.50    Types: Cigarettes    Quit date: 07/27/1996    Years since quitting: 26.7    Passive exposure: Past   Smokeless tobacco: Never   Tobacco comments:    Verified by Daughter, Marvin Mcdonald  Vaping Use   Vaping Use: Never used  Substance and Sexual Activity   Alcohol use: No    Alcohol/week: 0.0 standard drinks of alcohol   Drug use: No   Sexual activity: Not Currently  Other Topics Concern   Not on file  Social History Narrative   Supportive daughter   Right handed   Drinks caffeine  One story home   Social Determinants of Health   Financial Resource Strain: Low Risk  (04/28/2023)   Overall Financial Resource Strain (CARDIA)    Difficulty of Paying Living Expenses: Not very hard  Food Insecurity: No Food Insecurity (04/28/2023)   Hunger Vital Sign    Worried About Running Out of Food in the Last Year: Never true    Ran Out of Food in the Last Year: Never true  Transportation Needs: No Transportation Needs (04/28/2023)   PRAPARE - Administrator, Civil Service (Medical): No    Lack of Transportation (Non-Medical): No  Physical Activity: Insufficiently Active (04/28/2023)   Exercise Vital Sign    Days of Exercise per Week: 2 days    Minutes of Exercise per Session: 20 min  Stress: No Stress Concern Present (04/28/2023)   [employer] of Occupational Health - Occupational Stress  Questionnaire    Feeling of Stress : Not at all  Social Connections: Moderately Integrated (04/28/2023)   Social Connection and Isolation Panel [NHANES]    Frequency of Communication with Friends and Family: More than three times a week    Frequency of Social Gatherings with Friends and Family: Once a week    Attends Religious Services: More than 4 times per year    Active Member of [employer] or Organizations: Yes    Attends Banker Meetings: More than 4 times per year    Marital Status: Widowed  Intimate Partner Violence: Not At Risk (03/25/2022)   Humiliation, Afraid, Rape, and Kick questionnaire    Fear of Current or Ex-Partner: No    Emotionally Abused: No    Physically Abused: No    Sexually Abused: No    Outpatient Medications Prior to Visit  Medication Sig Dispense Refill   atorvastatin (LIPITOR) 40 MG tablet Take 1 tablet (40 mg total) by mouth daily. *Need appointment for future refills.* 30 tablet 0   Blood Glucose Monitoring Suppl (ONE TOUCH ULTRA 2) w/Device KIT Use glucometer to test sugars once daily and as needed. Dx E11.9 1 kit 0   glucose blood (ONETOUCH ULTRA) test strip Use to check sugar daily and as needed.  Dx Code: E11.9 100 each 5   metFORMIN (GLUCOPHAGE) 500 MG tablet Take 1 tablet (500 mg total) by mouth 2 (two) times daily with a meal. 180 tablet 1   OneTouch Delica Lancets 33G MISC Use as directed once daily and as needed.  Dx Code: E11.9 100 each 5   No facility-administered medications prior to visit.    No Known Allergies  Review of Systems  Constitutional:  Negative for fever and malaise/fatigue.  HENT:  Negative for congestion.   Eyes:  Negative for blurred vision.  Respiratory:  Negative for shortness of breath.   Cardiovascular:  Negative for chest pain, palpitations and leg swelling.  Gastrointestinal:  Negative for abdominal pain, blood in stool and nausea.  Genitourinary:  Negative for dysuria and frequency.  Musculoskeletal:   Negative for falls.  Skin:  Negative for rash.  Neurological:  Negative for dizziness, loss of consciousness and headaches.  Endo/Heme/Allergies:  Negative for environmental allergies.  Psychiatric/Behavioral:  Positive for memory loss. Negative for depression. The patient is not nervous/anxious.        Objective:    Physical Exam Vitals reviewed.  Constitutional:      Appearance: Normal appearance. He is not ill-appearing.  HENT:     Head: Normocephalic and atraumatic.     Nose: Nose normal.  Eyes:     Conjunctiva/sclera: Conjunctivae normal.  Cardiovascular:     Rate and Rhythm: Normal rate.     Pulses: Normal pulses.     Heart sounds: Normal heart sounds. No murmur heard. Pulmonary:     Effort: Pulmonary effort is normal.     Breath sounds: Normal breath sounds. No wheezing.  Abdominal:     Palpations: Abdomen is soft. There is no mass.     Tenderness: There is no abdominal tenderness.  Musculoskeletal:     Cervical back: Normal range of motion.     Right lower leg: No edema.     Left lower leg: No edema.  Skin:    General: Skin is warm and dry.  Neurological:     General: No focal deficit present.     Mental Status: He is alert and oriented to person, place, and time.  Psychiatric:        Mood and Affect: Mood normal.     BP 118/70 (BP Location: Left Arm, Patient Position: Sitting, Cuff Size: Normal)   Pulse 87   Temp 97.8 F (36.6 C) (Oral)   Resp 16   Ht 5\' 7"  (1.702 m)   Wt 120 lb 6.4 oz (54.6 kg)   SpO2 95%   BMI 18.86 kg/m  Wt Readings from Last 3 Encounters:  04/29/23 120 lb 6.4 oz (54.6 kg)  03/24/23 124 lb (56.2 kg)  02/19/23 129 lb 6.6 oz (58.7 kg)    Diabetic Foot Exam - Simple   No data filed    Lab Results  Component Value Date   WBC 9.1 04/29/2023   HGB 11.8 (L) 04/29/2023   HCT 35.4 (L) 04/29/2023   PLT 238.0 04/29/2023   GLUCOSE 137 (H) 04/29/2023   CHOL 187 04/29/2023   TRIG 143.0 04/29/2023   HDL 30.40 (L) 04/29/2023    LDLDIRECT 145.0 03/13/2022   LDLCALC 128 (H) 04/29/2023   ALT 9 04/29/2023   AST 12 04/29/2023   NA 138 04/29/2023   K 4.1 04/29/2023   CL 102 04/29/2023   CREATININE 0.91 04/29/2023   BUN 13 04/29/2023   CO2 25 04/29/2023   TSH 1.00 04/29/2023   PSA 5.70 (H) 01/27/2023   HGBA1C 6.6 (H) 04/29/2023   MICROALBUR 1.9 04/29/2023    Lab Results  Component Value Date   TSH 1.00 04/29/2023   Lab Results  Component Value Date   WBC 9.1 04/29/2023   HGB 11.8 (L) 04/29/2023   HCT 35.4 (L) 04/29/2023   MCV 96.9 04/29/2023   PLT 238.0 04/29/2023   Lab Results  Component Value Date   NA 138 04/29/2023   K 4.1 04/29/2023   CO2 25 04/29/2023   GLUCOSE 137 (H) 04/29/2023   BUN 13 04/29/2023   CREATININE 0.91 04/29/2023   BILITOT 0.7 04/29/2023   ALKPHOS 101 04/29/2023   AST 12 04/29/2023   ALT 9 04/29/2023   PROT 7.8 04/29/2023   ALBUMIN 3.8 04/29/2023   CALCIUM 9.5 04/29/2023   GFR 78.68 04/29/2023   Lab Results  Component Value Date   CHOL 187 04/29/2023   Lab Results  Component Value Date   HDL 30.40 (L) 04/29/2023   Lab Results  Component Value Date   LDLCALC 128 (H) 04/29/2023   Lab Results  Component Value Date   TRIG 143.0 04/29/2023   Lab Results  Component Value Date   CHOLHDL 6 04/29/2023   Lab Results  Component Value Date   HGBA1C 6.6 (H) 04/29/2023  Assessment & Plan:  Mixed hyperlipidemia Assessment & Plan: Tolerating statin, encouraged heart healthy diet, avoid trans fats, minimize simple carbs and saturated fats. Increase exercise as tolerated  Orders: -     Lipid panel  Essential hypertension Assessment & Plan: Well controlled, no changes to meds. Encouraged heart healthy diet such as the DASH diet and exercise as tolerated.   Orders: -     CBC with Differential/Platelet -     Comprehensive metabolic panel -     TSH  Mild dementia without behavioral disturbance, psychotic disturbance, mood disturbance, or anxiety,  unspecified dementia type [facility]) Assessment & Plan: Doing well with support of family, his daughter is with him today and is considering moving her father into her home to manage his care better.   Aortic atherosclerosis (HCC) Assessment & Plan: Identified incidentally on CT scan. asymptomatic   Osteoporosis without current pathological fracture, unspecified osteoporosis type  Hyperlipidemia associated with type 2 diabetes mellitus (HCC) -     Hemoglobin A1c -     Microalbumin / creatinine urine ratio    Assessment and Plan    Lung Mass: CT scan revealed a lung mass. Discussed the risks/benefits of biopsy vs watchful waiting. No current symptoms of cough, trouble breathing, or chest pain. -Plan for watchful waiting and repeat CT scan in a few months.  Poor Appetite and Weight Loss: Patient reported decreased appetite and weight loss. Discussed the importance of protein intake for maintaining strength and muscle mass. -Consider adding protein shakes (Orgain) to diet.  Caregiver Stress: Caregiver expressed stress related to caring for patient and managing his medications and appointments. Discussed the importance of self-care and the upcoming availability of chronic care management resources. -Order labs today. -Schedule follow-up appointment in three months, with the option for a virtual visit if more convenient. -Consider use of chronic care management resources when available.  Pending Move: Patient will be moving into caregiver's home in August. Discussed the potential challenges and need for additional support. -Continue to monitor and provide support as needed.         Danise Edge, MD

## 2023-04-28 NOTE — Assessment & Plan Note (Addendum)
Doing well with support of family, his daughter is with him today and is considering moving her father into her home to manage his care better.

## 2023-04-28 NOTE — Assessment & Plan Note (Signed)
Identified incidentally on CT scan. asymptomatic

## 2023-04-29 ENCOUNTER — Ambulatory Visit (INDEPENDENT_AMBULATORY_CARE_PROVIDER_SITE_OTHER): Payer: Medicare Other | Admitting: Family Medicine

## 2023-04-29 VITALS — BP 118/70 | HR 87 | Temp 97.8°F | Resp 16 | Ht 67.0 in | Wt 120.4 lb

## 2023-04-29 DIAGNOSIS — E785 Hyperlipidemia, unspecified: Secondary | ICD-10-CM

## 2023-04-29 DIAGNOSIS — E782 Mixed hyperlipidemia: Secondary | ICD-10-CM

## 2023-04-29 DIAGNOSIS — E1169 Type 2 diabetes mellitus with other specified complication: Secondary | ICD-10-CM

## 2023-04-29 DIAGNOSIS — I700 Atherosclerosis of aorta: Secondary | ICD-10-CM

## 2023-04-29 DIAGNOSIS — M810 Age-related osteoporosis without current pathological fracture: Secondary | ICD-10-CM

## 2023-04-29 DIAGNOSIS — Z7984 Long term (current) use of oral hypoglycemic drugs: Secondary | ICD-10-CM

## 2023-04-29 DIAGNOSIS — F03A0 Unspecified dementia, mild, without behavioral disturbance, psychotic disturbance, mood disturbance, and anxiety: Secondary | ICD-10-CM | POA: Diagnosis not present

## 2023-04-29 DIAGNOSIS — I10 Essential (primary) hypertension: Secondary | ICD-10-CM

## 2023-04-29 LAB — COMPREHENSIVE METABOLIC PANEL
ALT: 9 U/L (ref 0–53)
AST: 12 U/L (ref 0–37)
Albumin: 3.8 g/dL (ref 3.5–5.2)
Alkaline Phosphatase: 101 U/L (ref 39–117)
BUN: 13 mg/dL (ref 6–23)
CO2: 25 mEq/L (ref 19–32)
Calcium: 9.5 mg/dL (ref 8.4–10.5)
Chloride: 102 mEq/L (ref 96–112)
Creatinine, Ser: 0.91 mg/dL (ref 0.40–1.50)
GFR: 78.68 mL/min (ref >60.00)
Glucose, Bld: 137 mg/dL — ABNORMAL HIGH (ref 70–99)
Potassium: 4.1 mEq/L (ref 3.5–5.1)
Sodium: 138 mEq/L (ref 135–145)
Total Bilirubin: 0.7 mg/dL (ref 0.2–1.2)
Total Protein: 7.8 g/dL (ref 6.0–8.3)

## 2023-04-29 LAB — CBC WITH DIFFERENTIAL/PLATELET
Basophils Absolute: 0.1 10*3/uL (ref 0.0–0.1)
Basophils Relative: 0.7 % (ref 0.0–3.0)
Eosinophils Absolute: 0.1 10*3/uL (ref 0.0–0.7)
Eosinophils Relative: 0.8 % (ref 0.0–5.0)
HCT: 35.4 % — ABNORMAL LOW (ref 39.0–52.0)
Hemoglobin: 11.8 g/dL — ABNORMAL LOW (ref 13.0–17.0)
Lymphocytes Relative: 20.3 % (ref 12.0–46.0)
Lymphs Abs: 1.8 10*3/uL (ref 0.7–4.0)
MCHC: 33.3 g/dL (ref 30.0–36.0)
MCV: 96.9 fl (ref 78.0–100.0)
Monocytes Absolute: 0.6 10*3/uL (ref 0.1–1.0)
Monocytes Relative: 7.2 % (ref 3.0–12.0)
Neutro Abs: 6.4 10*3/uL (ref 1.4–7.7)
Neutrophils Relative %: 71 % (ref 43.0–77.0)
Platelets: 238 10*3/uL (ref 150.0–400.0)
RBC: 3.65 Mil/uL — ABNORMAL LOW (ref 4.22–5.81)
RDW: 13.8 % (ref 11.5–15.5)
WBC: 9.1 10*3/uL (ref 4.0–10.5)

## 2023-04-29 LAB — LIPID PANEL
Cholesterol: 187 mg/dL (ref 0–200)
HDL: 30.4 mg/dL — ABNORMAL LOW (ref >39.00)
LDL Cholesterol: 128 mg/dL — ABNORMAL HIGH (ref 0–99)
NonHDL: 157
Total CHOL/HDL Ratio: 6
Triglycerides: 143 mg/dL (ref 0.0–149.0)
VLDL: 28.6 mg/dL (ref 0.0–40.0)

## 2023-04-29 LAB — MICROALBUMIN / CREATININE URINE RATIO
Creatinine,U: 281.2 mg/dL
Microalb Creat Ratio: 0.7 mg/g (ref 0.0–30.0)
Microalb, Ur: 1.9 mg/dL (ref 0.0–1.9)

## 2023-04-29 LAB — TSH: TSH: 1 u[IU]/mL (ref 0.35–5.50)

## 2023-04-29 LAB — HEMOGLOBIN A1C: Hgb A1c MFr Bld: 6.6 % — ABNORMAL HIGH (ref 4.6–6.5)

## 2023-04-29 NOTE — Patient Instructions (Signed)
Dementia Dementia is a condition that affects the way the brain functions. It often affects memory and thinking. Usually, dementia gets worse with time and cannot be reversed (progressive dementia). There are many types of dementia, including: Alzheimer's disease. This type is the most common. Vascular dementia. This type may happen as the result of a stroke. Lewy body dementia. This type may happen to people who have Parkinson's disease. Frontotemporal dementia. This type is caused by damage to nerve cells (neurons) in certain parts of the brain. Some people may be affected by more than one type of dementia. This is called mixed dementia. What are the causes? Dementia is caused by damage to cells in the brain. The area of the brain and the types of cells damaged determine the type of dementia. Usually, this damage is irreversible or cannot be undone. Some examples of irreversible causes include: Conditions that affect the blood vessels of the brain, such as diabetes, heart disease, or blood vessel disease. Genetic mutations. In some cases, changes in the brain may be caused by another condition and can be reversed or slowed. Some examples of reversible causes include: Injury to the brain. Certain medicines. Infection, such as meningitis. Metabolic problems, such as vitamin B12 deficiency or thyroid disease. Pressure on the brain, such as from a tumor, blood clot, or too much fluid in the brain (hydrocephalus). Autoimmune diseases that affect the brain or arteries, such as limbic encephalitis or vasculitis. What are the signs or symptoms? Symptoms of dementia depend on the type of dementia. Common signs of dementia include problems with remembering, thinking, problem solving, decision making, and communicating. These signs develop slowly or get worse with time. This may include: Problems remembering events or people. Having trouble taking a bath or putting clothes on. Forgetting appointments or  forgetting to pay bills. Difficulty planning and preparing meals. Having trouble speaking. Getting lost easily. Changes in behavior or mood. How is this diagnosed? This condition is diagnosed by a specialist (neurologist). It is diagnosed based on the history of your symptoms, your medical history, a physical exam, and tests. Tests may include: Tests to evaluate brain function, such as memory tests, cognitive tests, and other tests. Lab tests, such as blood or urine tests. Imaging tests, such as a CT scan, a PET scan, or an MRI. Genetic testing. This may be done if other family members have a diagnosis of certain types of dementia. Your health care provider will talk with you and your family, friends, or caregivers about your history and symptoms. How is this treated? Treatment for this condition depends on the cause of the dementia. Progressive dementias, such as Alzheimer's disease, cannot be cured, but there may be treatments that help to manage symptoms. Treatment might involve taking medicines that may help to: Control the dementia. Slow down the progression of the dementia. Manage symptoms. In some cases, treating the cause of your dementia can improve symptoms, reverse symptoms, or slow down how quickly your dementia becomes worse. Your health care provider can direct you to support groups, organizations, and other health care providers who can help with decisions about your care. Follow these instructions at home: Medicines Take over-the-counter and prescription medicines only as told by your health care provider. Use a pill organizer or pill reminder to help you manage your medicines. Avoid taking medicines that can affect thinking, such as pain medicines or sleeping medicines. Lifestyle Make healthy lifestyle choices. Be physically active as told by your health care provider. Do not use any   products that contain nicotine or tobacco, such as cigarettes, e-cigarettes, and chewing  tobacco. If you need help quitting, ask your health care provider. Do not drink alcohol. Practice stress-management techniques when you get stressed. Spend time with other people. Make sure to get quality sleep. These tips can help you get a good night's rest: Avoid napping during the day. Keep your sleeping area dark and cool. Avoid exercising during the few hours before you go to bed. Avoid caffeine products in the evening. Eating and drinking Drink enough fluid to keep your urine pale yellow. Eat a healthy diet. General instructions  Work with your health care provider to determine what you need help with and what your safety needs are. Talk with your health care provider about whether it is safe for you to drive. If you were given a bracelet that identifies you as a person with memory loss or tracks your location, make sure to wear it at all times. Work with your family to make important decisions, such as advance directives, medical power of attorney, or a living will. Keep all follow-up visits. This is important. Where to find more information Alzheimer's Association: [employer] on Aging: CashCowGambling[employer]be World Health Organization: https://castaneda-walker.com/ Contact a health care provider if: You have any new or worsening symptoms. You have problems with choking or swallowing. Get help right away if: You feel depressed or sad, or feel that you want to harm yourself. Your family members become concerned for your safety. If you ever feel like you may hurt yourself or others, or have thoughts about taking your own life, get help right away. Go to your nearest emergency department or: Call your local emergency services (911 in the [place] Call a suicide crisis helpline, such as the National Suicide Prevention Lifeline at 812 061 5891 or 988 in the [place] This is open 24 hours a day in the [place] Text the Crisis Text Line at 858-301-1653 (in the [place] Summary Dementia is a  condition that affects the way the brain functions. Dementia often affects memory and thinking. Usually, dementia gets worse with time and cannot be reversed (progressive dementia). Treatment for this condition depends on the cause of the dementia. Work with your health care provider to determine what you need help with and what your safety needs are. Your health care provider can direct you to support groups, organizations, and other health care providers who can help with decisions about your care. This information is not intended to replace advice given to you by your health care provider. Make sure you discuss any questions you have with your health care provider. Document Revised: 05/14/2021 Document Reviewed: 03/04/2020 Elsevier Patient Education  2024 [employer].

## 2023-05-02 ENCOUNTER — Encounter: Payer: Self-pay | Admitting: Family Medicine

## 2023-06-16 ENCOUNTER — Ambulatory Visit (HOSPITAL_BASED_OUTPATIENT_CLINIC_OR_DEPARTMENT_OTHER): Payer: Medicare Other

## 2023-06-17 ENCOUNTER — Encounter (INDEPENDENT_AMBULATORY_CARE_PROVIDER_SITE_OTHER): Payer: Self-pay

## 2023-06-18 ENCOUNTER — Ambulatory Visit (HOSPITAL_BASED_OUTPATIENT_CLINIC_OR_DEPARTMENT_OTHER)
Admission: RE | Admit: 2023-06-18 | Discharge: 2023-06-18 | Disposition: A | Discharge status: Home / Self Care | Payer: Medicare Other | Source: Ambulatory Visit | Attending: Pulmonary Disease | Admitting: Pulmonary Disease

## 2023-06-18 DIAGNOSIS — R918 Other nonspecific abnormal finding of lung field: Secondary | ICD-10-CM | POA: Insufficient documentation

## 2023-06-18 DIAGNOSIS — J929 Pleural plaque without asbestos: Secondary | ICD-10-CM | POA: Diagnosis not present

## 2023-06-18 DIAGNOSIS — J90 Pleural effusion, not elsewhere classified: Secondary | ICD-10-CM | POA: Diagnosis not present

## 2023-06-21 DIAGNOSIS — E113393 Type 2 diabetes mellitus with moderate nonproliferative diabetic retinopathy without macular edema, bilateral: Secondary | ICD-10-CM | POA: Diagnosis not present

## 2023-06-21 DIAGNOSIS — H40013 Open angle with borderline findings, low risk, bilateral: Secondary | ICD-10-CM | POA: Diagnosis not present

## 2023-06-21 DIAGNOSIS — H04123 Dry eye syndrome of bilateral lacrimal glands: Secondary | ICD-10-CM | POA: Diagnosis not present

## 2023-06-21 DIAGNOSIS — D23112 Other benign neoplasm of skin of right lower eyelid, including canthus: Secondary | ICD-10-CM | POA: Diagnosis not present

## 2023-06-23 ENCOUNTER — Encounter: Payer: Self-pay | Admitting: Family Medicine

## 2023-07-01 ENCOUNTER — Ambulatory Visit (HOSPITAL_BASED_OUTPATIENT_CLINIC_OR_DEPARTMENT_OTHER): Payer: Medicare Other | Admitting: Pulmonary Disease

## 2023-07-01 ENCOUNTER — Encounter (HOSPITAL_BASED_OUTPATIENT_CLINIC_OR_DEPARTMENT_OTHER): Payer: Self-pay | Admitting: Pulmonary Disease

## 2023-07-01 ENCOUNTER — Encounter: Payer: Self-pay | Admitting: Family Medicine

## 2023-07-01 VITALS — BP 120/62 | HR 90 | Resp 16 | Ht 67.0 in | Wt 126.9 lb

## 2023-07-01 DIAGNOSIS — E119 Type 2 diabetes mellitus without complications: Secondary | ICD-10-CM

## 2023-07-01 DIAGNOSIS — T17908A Unspecified foreign body in respiratory tract, part unspecified causing other injury, initial encounter: Secondary | ICD-10-CM

## 2023-07-01 NOTE — Patient Instructions (Signed)
Right lower lobe lung mass, unchanged. Cannot rule out malignancy. --Patient no longer wants any more surveillance imaging or diagnostic testing --ORDERED swallow evaluation to rule out aspiration. Call daughter with results

## 2023-07-01 NOTE — Progress Notes (Signed)
Subjective:   PATIENT ID: Marvin Mcdonald GENDER: male DOB: 05/06/1941, MRN: [id number]  Chief Complaint  Patient presents with   Follow-up    Doing ok. He is coughing more often, especially after eating.     Reason for Visit: Follow-up for lung mass  Marvin Mcdonald is an 82 year old [place] speaking male with with mild Alzheimer's dementia, hearing loss, DM2, HTN, HLD, osteoporosis, latent TB s/p treatment who presents for evaluation for right lung mass.  Accompanied by his daughter.  Patient and family declines interpretor  Initial consult He was previously seen by Dr. 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 Daughter reports he did take medication for possible latent TB.  Today's visit was prompted by abnormal CXR ordered by PCP for right lower lobe mass. Follow-up CT with chronic scattered pleural calcifications and right nodular lower lobe mass with presence of air bronchograms  Lives alone at home independently. Ambulatory without assistance. Able to perform ADL. Daughter reports he does have some forgetfulness   Reports 15 lbs weight loss >12 months. Denies recent respiratory illness. Denies shortness of breath, cough or wheezing. Denies fevers/chills or night sweats or loss of appetite. Daughter reports sometimes he coughs after eating and may have difficult swallowing at times.  03/24/2023 Since our visit, no clinical changes.  No respiratory symptoms, denies shortness of breath, cough or wheezing except for cough after eating.  Has not had swallow evaluation.  That was not ordered on last visit.  After completing antibiotic course patient had repeat CT on 03/17/2023.  07/01/23 Daughter interprets and provides history. Since moving in she notices that he is coughing after eating. Denies shortness of breath or wheezing. Still waiting on swallow evaluation. Otherwise patient denies any changes since including no changes in  weight, no unexplained fevers/chills or night sweats  Social History: Former smoker. 13 pack years. Quit in 1997 Prior asbestos exposure  Past Medical History:  Diagnosis Date   Acute URI 01/03/2013   BPH (benign prostatic hypertrophy)    Colon cancer screening 04/01/2017   Colon polyps    Dental infection 10/07/2013   Diabetes mellitus    diet and exercise controlled   Hematuria 04/07/2013   Hyperlipidemia    Low back pain    Mass of neck 10/07/2013   Osteoporosis    Pain in joint, shoulder region 10/07/2013     Family History  Problem Relation Age of Onset   Hypertension Sister    Hypertension Brother    Depression Paternal Grandmother    Hypertension Brother    Hypertension Brother    Colon cancer Neg Hx    Stomach cancer Neg Hx    Esophageal cancer Neg Hx      Social History   Occupational History    Employer: [employer]   Tobacco Use   Smoking status: Former    Current packs/day: 0.00    Average packs/day: 0.5 packs/day for 25.0 years (12.5 ttl pk-yrs)    Types: Cigarettes    Start date: 07/28/1971    Quit date: 07/27/1996    Years since quitting: 26.9    Passive exposure: Past   Smokeless tobacco: Never   Tobacco comments:    Verified by Daughter, [place]  Vaping Use   Vaping status: Never Used  Substance and Sexual Activity   Alcohol use: No    Alcohol/week: 0.0 standard drinks of alcohol   Drug use: No   Sexual activity:  Not Currently    No Known Allergies   Outpatient Medications Prior to Visit  Medication Sig Dispense Refill   atorvastatin (LIPITOR) 40 MG tablet Take 1 tablet (40 mg total) by mouth daily. *Need appointment for future refills.* 30 tablet 0   Blood Glucose Monitoring Suppl (ONE TOUCH ULTRA 2) w/Device KIT Use glucometer to test sugars once daily and as needed. Dx E11.9 1 kit 0   glucose blood (ONETOUCH ULTRA) test strip Use to check sugar daily and as needed.  Dx Code: E11.9 100 each 5   metFORMIN (GLUCOPHAGE) 500 MG tablet  Take 1 tablet (500 mg total) by mouth 2 (two) times daily with a meal. 180 tablet 1   OneTouch Delica Lancets 33G MISC Use as directed once daily and as needed.  Dx Code: E11.9 100 each 5   No facility-administered medications prior to visit.    Review of Systems  Constitutional:  Negative for chills, diaphoresis, fever, malaise/fatigue and weight loss.  HENT:  Negative for congestion.   Respiratory:  Positive for cough. Negative for hemoptysis, sputum production, shortness of breath and wheezing.   Cardiovascular:  Negative for chest pain, palpitations and leg swelling.     Objective:   Vitals:   07/01/23 1442  BP: 120/62  Pulse: 90  Resp: 16  SpO2: 98%  Weight: 126 lb 14.4 oz (57.6 kg)  Height: 5\' 7"  (1.702 m)   SpO2: 98 %  Physical Exam: General: Well-appearing, no acute distress HENT: [place], AT Eyes: EOMI, no scleral icterus Respiratory: Clear to auscultation bilaterally.  No crackles, wheezing or rales Cardiovascular: RRR, -M/R/G, no JVD Extremities:-Edema,-tenderness Neuro: AAO x4, CNII-XII grossly intact Psych: Normal mood, normal affect   Data Reviewed:  Imaging: CT 02/03/23 - chronic scattered pleural calcifications and right nodular lower lobe mass with presence of air bronchograms CT chest 03/17/2023-unchanged lobular consolidation with air bronchograms and chronic scattered pleural calcifications CT Chest 06/18/23- unchanged masslike consolidation including in right lower lobe. Unchanged small right pleural effusion. Bilateral calcified pleural plaques  PFT: None on file  Labs: CBC    Component Value Date/Time   WBC 9.1 04/29/2023 1056   RBC 3.65 (L) 04/29/2023 1056   HGB 11.8 (L) 04/29/2023 1056   HCT 35.4 (L) 04/29/2023 1056   PLT 238.0 04/29/2023 1056   MCV 96.9 04/29/2023 1056   MCH 34.2 (H) 08/15/2020 1033   MCHC 33.3 04/29/2023 1056   RDW 13.8 04/29/2023 1056   LYMPHSABS 1.8 04/29/2023 1056   MONOABS 0.6 04/29/2023 1056   EOSABS 0.1 04/29/2023  1056   BASOSABS 0.1 04/29/2023 1056        Assessment & Plan:   Discussion: 82 year old [place] speaking male with with mild Alzheimer's dementia, hearing loss, DM2, HTN, HLD, osteoporosis, latent TB s/p treatment who presents for management for right lung mass. Unchanged with antibitoics Ddx includes malignancy, inflammation and infection. We again discussed goals of care with patient's known dementia and age.  Daughter remains unsure if aggressive treatment would be in the best interest for her father.  Based on the location of the mass, would recommend navigational bronchoscopy if we wish to pursue tissue biopsy. I addressed questions and concerns and after shared decision-making, patient and daughter elected to no longer follow the mass as patient is not interested in any advanced procedures even if this were to be cancer. He expresses that he has had friends do poorly at his age with cancer treatments so would not be interested in this for  himself. This could still be related to aspiration so we will pursue swallow evaluation.  Right lower lobe lung mass, unchanged. Cannot rule out malignancy. --Patient no longer wants any more surveillance imaging or diagnostic testing --ORDERED swallow evaluation to rule out aspiration. Call daughter with results  Health Maintenance Immunization History  Administered Date(s) Administered   Fluad Quad(high Dose 65+) 07/13/2019   Influenza Split 08/12/2016   Influenza Whole 08/18/2004, 07/23/2009, 07/18/2010   Influenza, High Dose Seasonal PF 08/01/2020   Influenza,inj,Quad PF,6+ Mos 07/18/2017, 08/07/2018, 07/14/2020, 08/29/2022   PFIZER(Purple Top)SARS-COV-2 Vaccination 11/23/2019, 12/14/2019   Pneumococcal Conjugate-13 10/05/2013   Pneumococcal Polysaccharide-23 07/21/2004, 10/13/2016   Tdap 05/05/2011   Zoster, Live 04/02/2006   CT Lung Screen - not qualified  Orders Placed This Encounter  Procedures   SLP modified barium swallow    Standing  Status:   Future    Standing Expiration Date:   06/30/2024    Order Specific Question:   Where should this test be performed:    Answer:   [facility]    Order Specific Question:   Please indicate reason for Referral:    Answer:   Concerned about Dysphagia/Aspiration    Order Specific Question:   Patients current diet consistency:    Answer:   Regular    Order Specific Question:   Patients current liquid consistency:    Answer:   Thin (All Liquid Allowed)    Order Specific Question:   Existing signs/symptoms of possible Aspiration/Dysphagia:    Answer:   Cough with Meals/Meds/Other P.O.s  No orders of the defined types were placed in this encounter.   Return if symptoms worsen or fail to improve.   I have spent a total time of 30-minutes on the day of the appointment including chart review, data review, collecting history, coordinating care and discussing medical diagnosis and plan with the patient/family. Past medical history, allergies, medications were reviewed. Pertinent imaging, labs and tests included in this note have been reviewed and interpreted independently by me.  Talayah Picardi Mechele Collin, MD [facility] 07/01/2023

## 2023-07-02 ENCOUNTER — Other Ambulatory Visit: Payer: Self-pay | Admitting: Family Medicine

## 2023-07-02 ENCOUNTER — Telehealth (HOSPITAL_COMMUNITY): Payer: Self-pay | Admitting: *Deleted

## 2023-07-02 ENCOUNTER — Telehealth: Payer: Self-pay | Admitting: Family Medicine

## 2023-07-02 DIAGNOSIS — R1310 Dysphagia, unspecified: Secondary | ICD-10-CM

## 2023-07-02 DIAGNOSIS — R059 Cough, unspecified: Secondary | ICD-10-CM

## 2023-07-02 NOTE — Telephone Encounter (Signed)
 Attempted to contact patient via interpreter services to schedule OP MBS. Left VM. RKEEL

## 2023-07-02 NOTE — Telephone Encounter (Signed)
[facility] called stating pt cancelled their initial appt with them with no reason indicated for cancellation.

## 2023-07-06 ENCOUNTER — Other Ambulatory Visit: Payer: Self-pay

## 2023-07-06 NOTE — Telephone Encounter (Signed)
glucose monitoring kits  sent

## 2023-07-13 MED ORDER — ONETOUCH ULTRA 2 W/DEVICE KIT: PACK | 0 refills | Status: DC

## 2023-07-13 MED ORDER — ONETOUCH ULTRA VI STRP: ORAL_STRIP | 5 refills | Status: DC

## 2023-07-13 MED ORDER — ONETOUCH DELICA LANCETS 33G MISC: 5 refills | Status: DC

## 2023-07-15 ENCOUNTER — Telehealth (HOSPITAL_COMMUNITY): Payer: Self-pay | Admitting: *Deleted

## 2023-07-15 NOTE — Telephone Encounter (Signed)
Attempted to contact patient via interpreter services to schedule OP MBS. Left VM. RKEEL

## 2023-07-26 ENCOUNTER — Telehealth (HOSPITAL_COMMUNITY): Payer: Self-pay | Admitting: *Deleted

## 2023-07-26 NOTE — Telephone Encounter (Signed)
3rd attempt to contact patient via inter[preter services to schedule OP MBS. Left VM. RKEEL.

## 2023-08-02 ENCOUNTER — Ambulatory Visit: Payer: Medicare Other | Admitting: Family Medicine

## 2023-08-10 ENCOUNTER — Telehealth: Payer: Self-pay | Admitting: Family Medicine

## 2023-08-10 ENCOUNTER — Ambulatory Visit (INDEPENDENT_AMBULATORY_CARE_PROVIDER_SITE_OTHER): Payer: Medicare Other | Admitting: Family

## 2023-08-10 ENCOUNTER — Telehealth: Payer: Self-pay | Admitting: *Deleted

## 2023-08-10 VITALS — BP 122/65 | HR 84 | Temp 97.6°F | Resp 16 | Ht 68.0 in | Wt 122.4 lb

## 2023-08-10 DIAGNOSIS — Z23 Encounter for immunization: Secondary | ICD-10-CM | POA: Diagnosis not present

## 2023-08-10 DIAGNOSIS — Z66 Do not resuscitate: Secondary | ICD-10-CM | POA: Diagnosis not present

## 2023-08-10 DIAGNOSIS — E118 Type 2 diabetes mellitus with unspecified complications: Secondary | ICD-10-CM | POA: Diagnosis not present

## 2023-08-10 DIAGNOSIS — Z7984 Long term (current) use of oral hypoglycemic drugs: Secondary | ICD-10-CM

## 2023-08-10 DIAGNOSIS — F0390 Unspecified dementia without behavioral disturbance: Secondary | ICD-10-CM

## 2023-08-10 DIAGNOSIS — R918 Other nonspecific abnormal finding of lung field: Secondary | ICD-10-CM

## 2023-08-10 DIAGNOSIS — R1310 Dysphagia, unspecified: Secondary | ICD-10-CM

## 2023-08-10 DIAGNOSIS — R972 Elevated prostate specific antigen [PSA]: Secondary | ICD-10-CM | POA: Diagnosis not present

## 2023-08-10 LAB — COMPREHENSIVE METABOLIC PANEL
ALT: 7 U/L (ref 0–53)
AST: 8 U/L (ref 0–37)
Albumin: 3.8 g/dL (ref 3.5–5.2)
Alkaline Phosphatase: 100 U/L (ref 39–117)
BUN: 12 mg/dL (ref 6–23)
CO2: 24 meq/L (ref 19–32)
Calcium: 9.1 mg/dL (ref 8.4–10.5)
Chloride: 106 meq/L (ref 96–112)
Creatinine, Ser: 0.82 mg/dL (ref 0.40–1.50)
GFR: 81.84 mL/min (ref >60.00)
Glucose, Bld: 137 mg/dL — ABNORMAL HIGH (ref 70–99)
Potassium: 3.6 meq/L (ref 3.5–5.1)
Sodium: 139 meq/L (ref 135–145)
Total Bilirubin: 0.6 mg/dL (ref 0.2–1.2)
Total Protein: 7.5 g/dL (ref 6.0–8.3)

## 2023-08-10 LAB — HEMOGLOBIN A1C: Hgb A1c MFr Bld: 6.6 % — ABNORMAL HIGH (ref 4.6–6.5)

## 2023-08-10 NOTE — Assessment & Plan Note (Signed)
Will update A1c.

## 2023-08-10 NOTE — Patient Instructions (Addendum)
VISIT SUMMARY:  During your visit, we discussed your lung mass, diabetes, cognitive decline, prostate health, and foot health. We also talked about your general health maintenance. Your lung mass is not causing any discomfort, and you've decided against invasive procedures. Your diabetes management has been challenging due to your preference for sweet foods. You've shown signs of memory loss and confusion, but no medication was recommended during your last neurology consultation. You've been seeing a urologist for your prostate health, and you have mild athlete's foot.  YOUR PLAN:  -LUNG MASS: You have a mass in your lung, but it's not causing any discomfort. You have decided to monitor your symptoms and  focus on quality of life instead of doing invasive procedures.  -DIABETES: You have diabetes, which is a condition that affects how your body uses blood sugar. We'll encourage a balanced diet and monitor your blood sugar levels to manage it.  -COGNITIVE DECLINE: You've been showing signs of memory loss and confusion. We'll refer you back to neurology for re-evaluation and place a social worker referral to explore potential community resources and support for your caregiver.  -PROSTATE HEALTH: You've been seeing a urologist for your prostate health. We'll continue to follow up with them as scheduled.  -FOOT HEALTH: You have mild athlete's foot. We recommend using over-the-counter Lamisil for two weeks and regular use of a good moisturizer.  -GENERAL HEALTH MAINTENANCE: We'll administer the influenza vaccine today and recommend getting the COVID-19 booster shot, RSV vaccine, and shingles shot at the pharmacy. We'll also check your blood labs and send the results to your caregiver.  INSTRUCTIONS:  Please follow up in approximately three months. In the meantime, remember to take your medications as prescribed, maintain a balanced diet, and monitor your blood sugar levels. Use over-the-counter  Lamisil for your athlete's foot for two weeks and regularly use a good moisturizer. Get the recommended vaccines at the pharmacy. We'll check your blood labs and send the results to your caregiver.

## 2023-08-10 NOTE — Telephone Encounter (Signed)
Sherri from [facility] wanted to advise they have accepted palliative care orders.

## 2023-08-10 NOTE — Progress Notes (Signed)
Subjective:     Patient ID: Marvin Mcdonald, male    DOB: [birth year], 82 y.o.   MRN: [id number]  Chief Complaint  Patient presents with   Dementia    Here for follow up   Diabetes    Here for follow up    HPI  Discussed the use of AI scribe software for clinical note transcription with the patient, who gave verbal consent to proceed.  History of Present Illness    The patient, with a history of dementia, diabetes and a recently discovered lung mass.  We discovered this lung mass at his physical back in March.  Patient, is accompanied by his daughter who provides the history.   The patient was referred to pulmonologyThe patient and his daughter have decided against invasive procedures/work up/treatment, focusing instead on maintaining the patient's quality of life. The patient occasionally coughs but does not complain of any discomfort.  The patient's diabetes management has been challenging due to his preference for sweet foods and his daughter's recent absence. Despite these challenges, his last A1c was 6.6. The patient's daughter is seeking social services support due to the patient's increasing care needs and her own health issues.  The patient also has a history of Elevated PSA and is followed by a urologist. He has seen a neurologist for memory loss, but no medication was recommended at the time. His memory issues have not improved, and a follow-up with neurology is suggested.  The patient's weight has remained stable, but his appetite is low, and he prefers sweet foods over healthier options.  Wt Readings from Last 3 Encounters:  08/10/23 122 lb 6.4 oz (55.5 kg)  07/01/23 126 lb 14.4 oz (57.6 kg)  04/29/23 120 lb 6.4 oz (54.6 kg)   Lab Results  Component Value Date   HGBA1C 6.6 (H) 04/29/2023   HGBA1C 7.8 (H) 01/27/2023   HGBA1C 7.9 (H) 03/13/2022   Lab Results  Component Value Date   MICROALBUR 1.9 04/29/2023   LDLCALC 128 (H) 04/29/2023   CREATININE 0.91 04/29/2023        Health Maintenance Due  Topic Date Due   Zoster Vaccines- Shingrix (1 of 2) 02/27/1991   DTaP/Tdap/Td (2 - Td or Tdap) 05/04/2021   Medicare Annual Wellness (AWV)  01/10/2023   INFLUENZA VACCINE  06/03/2023   COVID-19 Vaccine (3 - 2023-24 season) 07/04/2023    Past Medical History:  Diagnosis Date   Acute URI 01/03/2013   BPH (benign prostatic hypertrophy)    Colon cancer screening 04/01/2017   Colon polyps    Dental infection 10/07/2013   Diabetes mellitus    diet and exercise controlled   Hematuria 04/07/2013   Hyperlipidemia    Low back pain    Mass of neck 10/07/2013   Osteoporosis    Pain in joint, shoulder region 10/07/2013    Past Surgical History:  Procedure Laterality Date   COLONOSCOPY     HAND SURGERY     POLYPECTOMY      Family History  Problem Relation Age of Onset   Hypertension Sister    Hypertension Brother    Depression Paternal Grandmother    Hypertension Brother    Hypertension Brother    Colon cancer Neg Hx    Stomach cancer Neg Hx    Esophageal cancer Neg Hx     Social History   Socioeconomic History   Marital status: Widowed    Spouse name: Young [place]   Number of children: 2  Years of education: 64   Highest education level: 12th grade  Occupational History    Employer: [employer]   Tobacco Use   Smoking status: Former    Current packs/day: 0.00    Average packs/day: 0.5 packs/day for 25.0 years (12.5 ttl pk-yrs)    Types: Cigarettes    Start date: 07/28/1971    Quit date: 07/27/1996    Years since quitting: 27.0    Passive exposure: Past   Smokeless tobacco: Never   Tobacco comments:    Verified by Daughter, [place]  Vaping Use   Vaping status: Never Used  Substance and Sexual Activity   Alcohol use: No    Alcohol/week: 0.0 standard drinks of alcohol   Drug use: No   Sexual activity: Not Currently  Other Topics Concern   Not on file  Social History Narrative   Supportive daughter   Right handed   Drinks  caffeine   One story home   Social Determinants of Health   Financial Resource Strain: Low Risk  (04/28/2023)   Overall Financial Resource Strain (CARDIA)    Difficulty of Paying Living Expenses: Not very hard  Food Insecurity: No Food Insecurity (04/28/2023)   Hunger Vital Sign    Worried About Running Out of Food in the Last Year: Never true    Ran Out of Food in the Last Year: Never true  Transportation Needs: No Transportation Needs (04/28/2023)   PRAPARE - Administrator, Civil Service (Medical): No    Lack of Transportation (Non-Medical): No  Physical Activity: Insufficiently Active (04/28/2023)   Exercise Vital Sign    Days of Exercise per Week: 2 days    Minutes of Exercise per Session: 20 min  Stress: No Stress Concern Present (04/28/2023)   [employer] of Occupational Health - Occupational Stress Questionnaire    Feeling of Stress : Not at all  Social Connections: Moderately Integrated (04/28/2023)   Social Connection and Isolation Panel [NHANES]    Frequency of Communication with Friends and Family: More than three times a week    Frequency of Social Gatherings with Friends and Family: Once a week    Attends Religious Services: More than 4 times per year    Active Member of [employer] or Organizations: Yes    Attends Banker Meetings: More than 4 times per year    Marital Status: Widowed  Intimate Partner Violence: Not At Risk (03/25/2022)   Humiliation, Afraid, Rape, and Kick questionnaire    Fear of Current or Ex-Partner: No    Emotionally Abused: No    Physically Abused: No    Sexually Abused: No    Outpatient Medications Prior to Visit  Medication Sig Dispense Refill   atorvastatin (LIPITOR) 40 MG tablet Take 1 tablet (40 mg total) by mouth daily. *Need appointment for future refills.* 30 tablet 0   Blood Glucose Monitoring Suppl (ONE TOUCH ULTRA 2) w/Device KIT Use glucometer to test sugars once daily and as needed. Dx E11.9 1 kit 0    glucose blood (ONETOUCH ULTRA) test strip Use to check sugar daily and as needed.  Dx Code: E11.9 100 each 5   metFORMIN (GLUCOPHAGE) 500 MG tablet Take 1 tablet (500 mg total) by mouth 2 (two) times daily with a meal. 180 tablet 1   OneTouch Delica Lancets 33G MISC Use as directed once daily and as needed.  Dx Code: E11.9 100 each 5   No facility-administered medications prior to visit.  No Known Allergies  ROS See HPI    Objective:    Physical Exam Constitutional:      General: He is not in acute distress.    Appearance: He is well-developed.  HENT:     Head: Normocephalic and atraumatic.  Cardiovascular:     Rate and Rhythm: Normal rate and regular rhythm.     Heart sounds: No murmur heard. Pulmonary:     Effort: Pulmonary effort is normal. No respiratory distress.     Breath sounds: Examination of the right-lower field reveals wheezing. Wheezing present. No rales.  Skin:    General: Skin is warm and dry.  Neurological:     Mental Status: He is alert and oriented to person, place, and time.  Psychiatric:        Behavior: Behavior normal.        Thought Content: Thought content normal.    Diabetic Foot Exam - Simple   Simple Foot Form Diabetic Foot exam was performed with the following findings: Yes 08/10/2023 10:46 AM  Visual Inspection See comments: Yes Sensation Testing Intact to touch and monofilament testing bilaterally: Yes Pulse Check Posterior Tibialis and Dorsalis pulse intact bilaterally: Yes Comments Dry flaking skin noted bilateral feet      BP 122/65 (BP Location: Right Arm, Patient Position: Sitting, Cuff Size: Normal)   Pulse 84   Temp 97.6 F (36.4 C) (Oral)   Resp 16   Ht 5\' 8"  (1.727 m)   Wt 122 lb 6.4 oz (55.5 kg)   SpO2 98%   BMI 18.61 kg/m  Wt Readings from Last 3 Encounters:  08/10/23 122 lb 6.4 oz (55.5 kg)  07/01/23 126 lb 14.4 oz (57.6 kg)  04/29/23 120 lb 6.4 oz (54.6 kg)       Assessment & Plan:   Problem List Items  Addressed This Visit       Unprioritized   Right lower lobe lung mass - Primary    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  Pt is following with Pulmonology for serial CT scans and declines further work up at this time.  Daughter states that she discussed this with her father and he does not want further treatment/'work up at this time.  They wish to focus on quality of care.  Pt's daughter is very overwhelmed with his care.  She is a single mother of 2 teenage daughters and struggles to care for her dad.  She is interested in additional resources.  I will place a referral to social work and Palliative care.       Relevant Orders   Amb Referral to Palliative Care   AMB Referral to [facility] Coordinaton ([facility] Patients)   Elevated PSA    Lab Results  Component Value Date   PSA 5.70 (H) 01/27/2023   PSA 3.25 08/15/2020   PSA 3.06 07/13/2019   He is being followed by urology.       Dysphagia    Has swallow study scheduled.       DNR (do not resuscitate)    Discussed code status with daughter who is his POA.  She requests DNR.  Gold form was signed and given to the daughter to place on the refrigerator.       Dementia without behavioral disturbance, psychotic disturbance, mood disturbance, or anxiety [facility])    He saw Neurology >1 year ago. Will arrange a follow up visit.       Relevant Orders   Ambulatory referral to Neurology   AMB Referral to  Community Care Coordinaton ([facility]O Patients)   Controlled type 2 diabetes mellitus with complication, without long-term current use of insulin (HCC)    Will update A1c.        Relevant Orders   Comp Met (CMET)   HgB A1c   42 minutes spent on today's visit. The majority of this time was spent discussing goals of care and on care coordination.  I am having Marylouise Stacks. Lipsett maintain his atorvastatin, metFORMIN, ONE TOUCH ULTRA 2, OneTouch Ultra, and OneTouch Delica Lancets 33G.  No orders of the defined types were placed in this encounter.

## 2023-08-10 NOTE — Assessment & Plan Note (Signed)
He saw Neurology >1 year ago. Will arrange a follow up visit.

## 2023-08-10 NOTE — Progress Notes (Unsigned)
  Care Coordination  Outreach Note  08/10/2023 Name: Marvin Mcdonald MRN: [id number] DOB: 07-14-1941   Care Coordination Outreach Attempts: An unsuccessful telephone outreach was attempted today to offer the patient information about available care coordination services.  Follow Up Plan:  Additional outreach attempts will be made to offer the patient care coordination information and services.   Encounter Outcome:  No Answer  Burman Nieves, CCMA Care Coordination Care Guide Direct Dial: [phone]

## 2023-08-10 NOTE — Assessment & Plan Note (Addendum)
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  Pt is following with Pulmonology for serial CT scans and declines further work up at this time.  Daughter states that she discussed this with her father and he does not want further treatment/'work up at this time.  They wish to focus on quality of care.  Pt's daughter is very overwhelmed with his care.  She is a single mother of 2 teenage daughters and struggles to care for her dad.  She is interested in additional resources.  I will place a referral to social work and Palliative care.

## 2023-08-10 NOTE — Assessment & Plan Note (Signed)
Discussed code status with daughter who is his POA.  She requests DNR.  Gold form was signed and given to the daughter to place on the refrigerator.

## 2023-08-10 NOTE — Assessment & Plan Note (Signed)
Has swallow study scheduled.

## 2023-08-10 NOTE — Assessment & Plan Note (Signed)
Lab Results  Component Value Date   PSA 5.70 (H) 01/27/2023   PSA 3.25 08/15/2020   PSA 3.06 07/13/2019   He is being followed by urology.

## 2023-08-11 NOTE — Progress Notes (Unsigned)
  Care Coordination  Outreach Note  08/11/2023 Name: Marvin Mcdonald MRN: [id number] DOB: 04/05/1941   Care Coordination Outreach Attempts: A second unsuccessful outreach was attempted today to offer the patient with information about available care coordination services.  Follow Up Plan:  Additional outreach attempts will be made to offer the patient care coordination information and services.   Encounter Outcome:  No Answer  Burman Nieves, CCMA Care Coordination Care Guide Direct Dial: [phone]

## 2023-08-12 NOTE — Progress Notes (Signed)
  Care Coordination   Note   08/12/2023 Name: Marvin Mcdonald MRN: [id number] DOB: 03/21/1941  Marvin Mcdonald is a 82 y.o. year old male who sees Bradd Canary, MD for primary care. I reached out to [facility] by phone today to offer care coordination services.  [name] was given information about Care Coordination services today including:   The Care Coordination services include support from the care team which includes your Nurse Coordinator, Clinical Social Worker, or Pharmacist.  The Care Coordination team is here to help remove barriers to the health concerns and goals most important to you. Care Coordination services are voluntary, and the patient may decline or stop services at any time by request to their care team member.   Care Coordination Consent Status: Patient agreed to services and verbal consent obtained.   Follow up plan:  Telephone appointment with care coordination team member scheduled for:  08/19/2023  Encounter Outcome:  Patient Scheduled from referral   Burman Nieves, [facility] Care Coordination Care Guide Direct Dial: [phone]

## 2023-08-17 ENCOUNTER — Encounter: Payer: Self-pay | Admitting: Speech Pathology

## 2023-08-17 ENCOUNTER — Other Ambulatory Visit (HOSPITAL_COMMUNITY): Payer: Self-pay | Admitting: *Deleted

## 2023-08-17 ENCOUNTER — Ambulatory Visit: Payer: Medicare Other | Attending: Family Medicine | Admitting: Speech Pathology

## 2023-08-17 DIAGNOSIS — R059 Cough, unspecified: Secondary | ICD-10-CM | POA: Diagnosis not present

## 2023-08-17 DIAGNOSIS — R1310 Dysphagia, unspecified: Secondary | ICD-10-CM | POA: Diagnosis not present

## 2023-08-17 NOTE — Therapy (Signed)
OUTPATIENT SPEECH LANGUAGE PATHOLOGY SWALLOW EVALUATION   Patient Name: Marvin Mcdonald MRN: [id number] DOB:27-Oct-1941, 82 y.o., male Today's Date: 08/17/2023  PCP: Dr. Joaquin Courts REFERRING PROVIDER: Dr. Joaquin Courts  END OF SESSION:  End of Session - 08/17/23 0958     Visit Number 1    Number of Visits 5    Date for SLP Re-Evaluation 09/21/23    Authorization Type U[employer]    SLP Start Time 0930    SLP Stop Time  0958    SLP Time Calculation (min) 28 min    Activity Tolerance Patient tolerated treatment well             Past Medical History:  Diagnosis Date   Acute URI 01/03/2013   BPH (benign prostatic hypertrophy)    Colon cancer screening 04/01/2017   Colon polyps    Dental infection 10/07/2013   Diabetes mellitus    diet and exercise controlled   Hematuria 04/07/2013   Hyperlipidemia    Low back pain    Mass of neck 10/07/2013   Osteoporosis    Pain in joint, shoulder region 10/07/2013   Past Surgical History:  Procedure Laterality Date   COLONOSCOPY     HAND SURGERY     POLYPECTOMY     Patient Active Problem List   Diagnosis Date Noted   DNR (do not resuscitate) 08/10/2023   Aortic atherosclerosis (HCC) 04/28/2023   Dysphagia 03/24/2023   Right lower lobe lung mass 02/19/2023   Weight loss 01/27/2023   Elevated PSA 01/27/2023   Dementia without behavioral disturbance, psychotic disturbance, mood disturbance, or anxiety (HCC) 03/13/2022   Glaucoma 02/07/2020   Educated about COVID-19 virus infection 02/07/2020   Hearing loss 01/08/2019   Dry eyes 04/01/2017   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 06/19/2015   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   Preventative health care 06/03/2015   Newly recognized murmur 06/03/2015   Decreased visual acuity 06/03/2015   Pain in joint, shoulder region 10/07/2013   Hematuria 04/07/2013   Macrocytosis 10/07/2011   Essential hypertension 11/16/2008   Controlled type 2 diabetes  mellitus with complication, without long-term current use of insulin (HCC) 11/07/2008   Mixed hyperlipidemia 05/31/2007   History of BPH 05/31/2007   Osteoporosis 05/31/2007   Hx of colonic polyp 05/31/2007    ONSET DATE: 07/02/23 referral date   REFERRING DIAG:  R13.10 (ICD-10-CM) - Dysphagia, unspecified type  R05.9 (ICD-10-CM) - Cough, unspecified type    THERAPY DIAG:  Dysphagia, unspecified type  Rationale for Evaluation and Treatment: Rehabilitation  SUBJECTIVE:   SUBJECTIVE STATEMENT: Per daughter, pt is coughing with PO intake with increasing frequency. Pt denies challenges swallowing. 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 Referral has been placed for palliative care.   PAIN:  Are you having pain? No  FALLS: Has patient fallen in last 6 months?  No  LIVING ENVIRONMENT: Lives with: lives with their family Lives in: House/apartment  PLOF:  Level of assistance: Needed assistance with IADLS Employment: Retired  PATIENT GOALS: ensure no dysphagia  OBJECTIVE:  Note: Objective measures were completed at Evaluation unless otherwise noted. OBJECTIVE:   DIAGNOSTIC FINDINGS: 06/18/23 Chest CT without opacities noted.   INSTRUMENTAL SWALLOW STUDY FINDINGS (MBSS) Scheduled for 09/25/2023 at 11:30 Objective swallow impairments: TBD Objective recommended compensations: TBD  COGNITION: Overall cognitive status: History of cognitive impairments - at baseline  SUBJECTIVE DYSPHAGIA REPORTS:  Date of onset: "months" Reported symptoms: coughing with both solids and liquids  Current diet: regular and thin liquids  Co-morbid voice changes: Yes (daughter reports hoarseness)   FACTORS WHICH MAY INCREASE RISK OF ADVERSE EVENT IN PRESENCE OF ASPIRATION:  General health: well appearing  Risk factors: reduced cognitive function, pulmonary compromise      ORAL MOTOR EXAMINATION: Overall status: WFL  CLINICAL SWALLOW ASSESSMENT:   Dentition: adequate natural dentition Vocal quality at baseline: normal Patient directly observed with POs: Yes: regular, dysphagia 1 (puree), thin liquids, and mixed consistancies  Feeding: able to feed self Liquids provided by: cup [facility] Swallow Protocol: Fail: throat clearing Oral phase signs and symptoms:  fast rate of PO administration, oral residue pt spontaneously attempts to clear with lingual sweep and re-swallow Pharyngeal phase signs and symptoms: multiple swallows, immediate throat clear, and delayed throat clear  PATIENT REPORTED OUTCOME MEASURES (PROM): Deferred d/t reduced insight into impairments   TODAY'S TREATMENT:                                                                                                                                         DATE:  08/17/2023: eval only, assisted with scheduling MBSS.   PATIENT EDUCATION: Education details: results/recommendations, aspiration precautions Person educated: Patient and Engineer, structural (daughter) Education method: Medical illustrator Education comprehension: verbalized understanding and needs further education   ASSESSMENT:  CLINICAL IMPRESSION: Patient is a 82 y.o. M who was seen today for dysphagia evaluation. Clinical swallow evaluation remarkable for immediate and delayed throat clearing with liquids and solids. With solids, pt with some oral residue which he manages with lingual sweeps and re-swallows. Chart review reveals X3 attempts to contact pt for scheduling of MBSS. Explained benefit of instrumental swallow study to inform plan of care. Pt and daughter agreeable to study. SLP contacted scheduler and assisted in making appointment. Will follow up after instrumental swallow study to review results and implement strategies and compensations PRN.   OBJECTIVE IMPAIRMENTS: include dysphagia. These impairments are limiting  patient from safety when swallowing. Factors affecting potential to achieve goals and functional outcome are ability to learn/carryover information and reduced insight . Patient will benefit from skilled SLP services to address above impairments and improve overall function.  REHAB POTENTIAL: Fair (dementia)   GOALS: Goals reviewed with patient? Yes  SHORT TERM GOALS = LONG TERM GOALS (d/t length of POC) Target date: 09/14/2023  Pt's caregiver will teach back aspiration precautions and report carryover over 1 week period  Baseline: Goal status: INITIAL  2.  Pt will demonstrate swallow strategies/compensation with PO intake with use of visual or verbal cues PRN Baseline:  Goal status: INITIAL  3.  Daughter will demonstrate cues to aid in carryover of swallow strategies/compensation Baseline:  Goal status: INITIAL  4.  Pt will complete dysphagia HEP, if indicated per MBSS, over 1 week period Baseline:  Goal status: INITIAL   PLAN:  SLP FREQUENCY: 1x/week  SLP DURATION: 4 weeks  PLANNED INTERVENTIONS: 92526 Treatment of swallowing function, Re-evaluation, Aspiration precaution training, Pharyngeal strengthening exercises, Diet toleration management , Trials of upgraded texture/liquids, and Patient/family education  Maia Breslow, CCC-SLP 08/17/2023, 10:14 AM

## 2023-08-19 ENCOUNTER — Ambulatory Visit: Payer: Self-pay | Admitting: Licensed Clinical Social Worker

## 2023-08-19 NOTE — Patient Instructions (Signed)
Visit Information  Thank you for taking time to visit with me today. Please don't hesitate to contact me if I can be of assistance to you.   Following are the goals we discussed today:   Goals Addressed             This Visit's Progress    Obtain Supportive Resources-Caregiver Stress   On track    Activities and task to complete in order to accomplish goals.   Keep all upcoming appointments discussed today Continue with compliance of taking medication prescribed by Doctor Implement healthy coping skills discussed to assist with management of symptoms         Our next appointment is by telephone on 10/31 at 3 PM  Please call the care guide team at [phone] if you need to cancel or reschedule your appointment.   If you are experiencing a Mental Health or Behavioral Health Crisis or need someone to talk to, please call the Suicide and Crisis Lifeline: 988 call 911   Patient verbalizes understanding of instructions and care plan provided today and agrees to view in MyChart. Active MyChart status and patient understanding of how to access instructions and care plan via MyChart confirmed with patient.     Jenel Lucks, MSW, LCSW [facility] Care Management [place]  [facility] [email or web address] Phone [phone] 7:03 PM

## 2023-08-19 NOTE — Patient Outreach (Signed)
  Care Coordination   Initial Visit Note   08/19/2023 Name: Marvin Mcdonald MRN: [id number] DOB: May 18, 1941  Marvin Mcdonald is a 82 y.o. year old male who sees Bradd Canary, MD for primary care. I spoke with  Marvin Coon Erhardt's daughter by phone today.  What matters to the patients health and wellness today?  Caregiver Stress    Goals Addressed             This Visit's Progress    Obtain Supportive Resources-Caregiver Stress   On track    Activities and task to complete in order to accomplish goals.   Keep all upcoming appointments discussed today Continue with compliance of taking medication prescribed by Doctor Implement healthy coping skills discussed to assist with management of symptoms         SDOH assessments and interventions completed:  No     Care Coordination Interventions:  Yes, provided  Interventions Today    Flowsheet Row Most Recent Value  Chronic Disease   Chronic disease during today's visit Diabetes, Hypertension (HTN), Other  [Dementia]  General Interventions   General Interventions Discussed/Reviewed General Interventions Discussed, Community Resources, Doctor Visits, Level of Care  Doctor Visits Discussed/Reviewed Doctor Visits Reviewed  Level of Care Adult Daycare, Assisted Living  Mental Health Interventions   Mental Health Discussed/Reviewed Mental Health Discussed, Coping Strategies  [Caregiver stress endorsed. Validation and encouragement provided. Strategies to assist with management of stress discussed]  Nutrition Interventions   Nutrition Discussed/Reviewed Nutrition Discussed  Pharmacy Interventions   Pharmacy Dicussed/Reviewed Pharmacy Topics Discussed, Medication Adherence  Safety Interventions   Safety Discussed/Reviewed Safety Discussed       Follow up plan: Follow up call scheduled for 2-4 weeks    Encounter Outcome:  Patient Visit Completed   Jenel Lucks, MSW, LCSW [facility] Care Management [facility]  [facility] [email or web address] Phone [phone] 7:02 PM

## 2023-08-23 ENCOUNTER — Ambulatory Visit: Payer: Medicare Other | Admitting: Urology

## 2023-08-23 ENCOUNTER — Encounter: Payer: Self-pay | Admitting: Urology

## 2023-08-23 VITALS — BP 121/74 | HR 71 | Ht 68.0 in | Wt 123.0 lb

## 2023-08-23 DIAGNOSIS — R972 Elevated prostate specific antigen [PSA]: Secondary | ICD-10-CM | POA: Diagnosis not present

## 2023-08-23 NOTE — Progress Notes (Signed)
   Assessment: 1. Elevated PSA     Plan: Free and total PSA today - will call with results and recommendations for follow-up  Chief Complaint:  Chief Complaint  Patient presents with   Elevated PSA    History of Present Illness:  Marvin Mcdonald is a 82 y.o. male who is seen for continued evaluation of elevated PSA. PSA results: 2/20 2.89 9/20 3.06 10/21 3.25 3/24 5.70  No history of prostatitis or UTIs.  No family history of prostate cancer.  No prior prostate biopsy. He does not have any lower urinary tract symptoms other than nocturia x 1.  No dysuria or gross hematuria. IPSS = 1.  He returns today for follow-up.  No new urinary symptoms.  No dysuria or gross hematuria.  The patient's daughter interpreted during the visit today.  Past Medical History:  Past Medical History:  Diagnosis Date   Acute URI 01/03/2013   BPH (benign prostatic hypertrophy)    Colon cancer screening 04/01/2017   Colon polyps    Dental infection 10/07/2013   Diabetes mellitus    diet and exercise controlled   Hematuria 04/07/2013   Hyperlipidemia    Low back pain    Mass of neck 10/07/2013   Osteoporosis    Pain in joint, shoulder region 10/07/2013    Past Surgical History:  Past Surgical History:  Procedure Laterality Date   COLONOSCOPY     HAND SURGERY     POLYPECTOMY      Allergies:  No Known Allergies  Family History:  Family History  Problem Relation Age of Onset   Hypertension Sister    Hypertension Brother    Depression Paternal Grandmother    Hypertension Brother    Hypertension Brother    Colon cancer Neg Hx    Stomach cancer Neg Hx    Esophageal cancer Neg Hx     Social History:  Social History   Tobacco Use   Smoking status: Former    Current packs/day: 0.00    Average packs/day: 0.5 packs/day for 25.0 years (12.5 ttl pk-yrs)    Types: Cigarettes    Start date: 07/28/1971    Quit date: 07/27/1996    Years since quitting: 27.0    Passive exposure: Past    Smokeless tobacco: Never   Tobacco comments:    Verified by Daughter, Eun Fairclough  Vaping Use   Vaping status: Never Used  Substance Use Topics   Alcohol use: No    Alcohol/week: 0.0 standard drinks of alcohol   Drug use: No    ROS: Constitutional:  Negative for fever, chills, weight loss CV: Negative for chest pain, previous MI, hypertension Respiratory:  Negative for shortness of breath, wheezing, sleep apnea, frequent cough GI:  Negative for nausea, vomiting, bloody stool, GERD  Physical exam: BP 121/74   Pulse 71   Ht 5\' 8"  (1.727 m)   Wt 123 lb (55.8 kg)   BMI 18.70 kg/m  GENERAL APPEARANCE:  Well appearing, well developed, well nourished, NAD HEENT:  Atraumatic, normocephalic, oropharynx clear NECK:  Supple without lymphadenopathy or thyromegaly ABDOMEN:  Soft, non-tender, no masses EXTREMITIES:  Moves all extremities well, without clubbing, cyanosis, or edema NEUROLOGIC:  Alert and oriented x 3, normal gait, CN II-XII grossly intact MENTAL STATUS:  appropriate BACK:  Non-tender to palpation, No CVAT SKIN:  Warm, dry, and intact GU: Prostate: 40 g, NT, no nodules Rectum: Normal tone,  no masses or tenderness   Results: U/A:  0-2 RBC

## 2023-08-24 ENCOUNTER — Encounter: Payer: Self-pay | Admitting: Urology

## 2023-08-24 LAB — PSA, TOTAL AND FREE
PSA, Free Pct: 13.5 %
PSA, Free: 0.77 ng/mL
Prostate Specific Ag, Serum: 5.7 ng/mL — ABNORMAL HIGH (ref 0.0–4.0)

## 2023-08-25 ENCOUNTER — Ambulatory Visit (HOSPITAL_COMMUNITY)
Admission: RE | Admit: 2023-08-25 | Discharge: 2023-08-25 | Disposition: A | Discharge status: Home / Self Care | Payer: Medicare Other | Source: Ambulatory Visit | Attending: Family Medicine | Admitting: Family Medicine

## 2023-08-25 DIAGNOSIS — R1310 Dysphagia, unspecified: Secondary | ICD-10-CM

## 2023-08-25 DIAGNOSIS — T17908A Unspecified foreign body in respiratory tract, part unspecified causing other injury, initial encounter: Secondary | ICD-10-CM

## 2023-08-25 NOTE — Therapy (Signed)
Modified Barium Swallow Study  Patient Details  Name: Marvin Mcdonald MRN: [id number] Date of Birth: 02/04/1941  Today's Date: 08/25/2023  Modified Barium Swallow completed.  Full report located under Chart Review in the Imaging Section.  History of Present Illness [name] was seen for this modified barium swallow study per his daughter's observations of increased instances of coughing and throat clearing with POs. [name] was seen at [facility] for dysphagia evaluation on 08/17/23. Per encounter documentation, Marvin Mcdonald presented with immediate and delayed throat clearing with liquids and solids. PMH is significant for dementia, diabetes mellitus, and hyperlipidemia.   Clinical Impression Marvin Mcdonald was seen by SLP for a modified barium swallow study per his daughter's observations of increased frequency of coughing with POs. He was alert and agreeable to this assessment. Mr. Timmer presents with an overall functional oropharyngeal swallow. No instances of aspiration, penetration, or significant residuals were observed. Swallow was initiated at the level of the vallecula. Hyolaryngeal excursion was adequate and complete. Epiglottis was difficult to visualize, but appeared to provide adequate airway protection. Mild esophageal retention was observed. Barium tablet initially stalled at the vallecula but cleared with second sip of thin liquid. Marvin Mcdonald daughter stated that he often reclines after meals. Provided acid reflux precautions education to [name] and his daughter. SLP recommending [name] may continue his regular diet with thin liquids. Medications may be taken with thin liquids as tolerated. No further ST indicated at this time.   Swallow Evaluation Recommendations Recommendations: PO diet PO Diet Recommendation: Regular;Thin liquids (Level 0) Liquid Administration via: Straw;Cup;Spoon Medication Administration: Whole meds with liquid Supervision: Patient able to  self-feed;Intermittent supervision/cueing for swallowing strategies Swallowing strategies  : Slow rate;Minimize environmental distractions;Small bites/sips Postural changes: Stay upright 30-60 min after meals;Position pt fully upright for meals Oral care recommendations: Oral care BID (2x/day)      Marline Backbone, B.S., Speech Therapy Student   08/25/2023,1:11 PM

## 2023-08-26 LAB — URINALYSIS, ROUTINE W REFLEX MICROSCOPIC
Bilirubin, UA: NEGATIVE
Glucose, UA: NEGATIVE
Ketones, UA: NEGATIVE
Leukocytes,UA: NEGATIVE
Nitrite, UA: NEGATIVE
Protein,UA: NEGATIVE
Specific Gravity, UA: >1.03 — ABNORMAL HIGH (ref 1.005–1.030)
Urobilinogen, Ur: 1 mg/dL (ref 0.2–1.0)
pH, UA: 5.5 (ref 5.0–7.5)

## 2023-08-26 LAB — MICROSCOPIC EXAMINATION

## 2023-09-02 ENCOUNTER — Ambulatory Visit: Payer: Self-pay | Admitting: Licensed Clinical Social Worker

## 2023-09-03 NOTE — Patient Instructions (Signed)
Visit Information  Thank you for taking time to visit with me today. Please don't hesitate to contact me if I can be of assistance to you.   Following are the goals we discussed today:   Goals Addressed             This Visit's Progress    Obtain Supportive Resources-Caregiver Stress   On track    Activities and task to complete in order to accomplish goals.   Keep all upcoming appointments discussed today Continue with compliance of taking medication prescribed by Doctor Implement healthy coping skills discussed to assist with management of symptoms         Our next appointment is by telephone on 12/5 at 3 PM  Please call the care guide team at [phone] if you need to cancel or reschedule your appointment.   If you are experiencing a Mental Health or Behavioral Health Crisis or need someone to talk to, please call the Suicide and Crisis Lifeline: 988 call 911   Patient verbalizes understanding of instructions and care plan provided today and agrees to view in MyChart. Active MyChart status and patient understanding of how to access instructions and care plan via MyChart confirmed with patient.     Jenel Lucks, MSW, LCSW [facility] Care Management [place]  [facility] [email or web address] Phone [phone] 6:06 AM

## 2023-09-03 NOTE — Patient Outreach (Signed)
  Care Coordination   Follow Up Visit Note   09/02/2023 Name: Marvin Mcdonald MRN: [id number] DOB: 10/05/1941  Marvin Mcdonald is a 82 y.o. year old male who sees Bradd Canary, MD for primary care. I spoke with  Marvin Mcdonald's daughter by phone today.  What matters to the patients health and wellness today?  Caregiver Stress    Goals Addressed             This Visit's Progress    Obtain Supportive Resources-Caregiver Stress   On track    Activities and task to complete in order to accomplish goals.   Keep all upcoming appointments discussed today Continue with compliance of taking medication prescribed by Doctor Implement healthy coping skills discussed to assist with management of symptoms         SDOH assessments and interventions completed:  No     Care Coordination Interventions:  Yes, provided  Interventions Today    Flowsheet Row Most Recent Value  Chronic Disease   Chronic disease during today's visit Diabetes, Hypertension (HTN), Other  [Dementia]  General Interventions   General Interventions Discussed/Reviewed General Interventions Reviewed, Community Resources, Doctor Visits  Doctor Visits Discussed/Reviewed Doctor Visits Reviewed  Mental Health Interventions   Mental Health Discussed/Reviewed Mental Health Reviewed, Coping Strategies  [Strategies to assist with management of caregiver stress discussed]       Follow up plan: Follow up call scheduled for 4-6 weeks    Encounter Outcome:  Patient Visit Completed   Jenel Lucks, MSW, LCSW [facility] Care Management [facility]  [facility] [email or web address] Phone [phone] 6:05 AM

## 2023-09-22 ENCOUNTER — Encounter: Payer: Self-pay | Admitting: Physician Assistant

## 2023-09-22 ENCOUNTER — Ambulatory Visit: Payer: Medicare Other | Admitting: Physician Assistant

## 2023-09-22 VITALS — BP 113/68 | HR 97 | Resp 18 | Ht 68.0 in | Wt 121.0 lb

## 2023-09-22 DIAGNOSIS — R413 Other amnesia: Secondary | ICD-10-CM | POA: Diagnosis not present

## 2023-09-22 MED ORDER — DONEPEZIL HCL 10 MG PO TABS: ORAL_TABLET | ORAL | 11 refills | Status: DC

## 2023-09-22 NOTE — Progress Notes (Signed)
Assessment/Plan:      Memory Impairment of unclear etiology   Marvin Mcdonald is a very pleasant 82 y.o. RH male with a history of hypertension, hyperlipidemia , RLL mass, DM2, seen today in follow up for memory loss. He has not been seen since 04/2022, losing to follow up. MME today is 20. Prior MRI brain was without acute findings. Etiology of memory loss is unclear. Suspect a psychiatric component as well. He is able to participate on ADLS, but there is a lack on interest in participate in activities outside of home. He moved to her house, but daughter appears overwhelmed. Social Worker is involved, to help with HHN, sitter, etc.     Follow up in  6 months. Start Donepezil 10 mg :Take half tablet (5 mg) daily for 2 weeks, then increase to the full tablet at 10 mg daily. Side effects discussed   Recommend good control of her cardiovascular risk factors Continue to control mood as per PCP Agree with SW to help her daughter with resources, she may be experiencing caregiver distress.    Subjective:    This patient is accompanied in the office by his daughter  who supplements the history.  Previous records as well as any outside records available were reviewed prior to todays visit. Patient was last seen on 04/2022 with MoCA 18/30. MRI brain at the time, personally reviewed remarkable for mild to moderate chronic ischemic changes within the cerebral white matter, no acute findings, normal volume for age      Any changes in memory since last visit? " Worse". Once moving with his daughter she noticed that his memory was not good". Patient has some difficulty remembering recent conversations, new information and people names according to daughter  repeats oneself?  Endorsed Disoriented when walking into a room?  Patient denies.    Leaving objects?  May misplace things but not in unusual places   Wandering behavior?  Denies.   Any personality changes since last visit? Denies. "Sometimes he  engages too much "- daughter reports.  Any worsening depression?:  Denies.   Hallucinations or paranoia?  Denies.   Seizures? denies    Any sleep changes? Sleeps well, too much . Denies vivid dreams, REM behavior or sleepwalking   Sleep apnea?   Denies.   Any hygiene concerns? She has to remind him to shower.  Independent of bathing and dressing?  Endorsed  Does the patient needs help with medications?  Daughter is in charge   Who is in charge of the finances?  Daughter  is in charge     Any changes in appetite? He eats well.  Patient have trouble swallowing? Denies.   Does the patient cook? No Any headaches?   denies   Chronic back pain  denies   Ambulates with difficulty? Denies.   Recent falls or head injuries? denies     Unilateral weakness, numbness or tingling? denies   Any tremors?  Denies   Any anosmia?  Denies   Any incontinence of urine? Denies  Any bowel dysfunction?   Denies      Patient lives alone, to move with his daughter .    Does the patient drive? No longer drives    Initial visit 04/2022 How long did patient have memory difficulties? For about 6-8 months  HE has issues with STM and learning new tasks. His daughter has to "teach him how to use the remote and the cell phone several times". He also forgets  appointments  Patient lives with:  Trying to sell the house, so he is "figuring things". He is likely to live with a friend for safety. Social work is involved in all of his ADLs needs. repeats oneself? Endorsed  Disoriented when walking into a room?  Patient denies   Leaving objects in unusual places?  Patient denies   Ambulates  with difficulty?   Patient denies , but admits to not walking enough Recent falls?  Patient denies   Any head injuries?  Patient denies   History of seizures?   Patient denies   Wandering behavior?  Patient denies   Patient drives?   He continues to drive, and his daughter noted a decreased response versus less attentiveness, but does  short distances only  Any mood changes "no really serious mood changes"-daughter says  Any history of depression?:   Endorsed "to a certain degree since his wife died". She died suddenly last 09/27/2023. Since then he may feel "down", "A [employer] lady comes to care for him and keeps him company".  Hallucinations?  Patient denies   Paranoia?  Patient denies   Patient reports that he sleeps well without vivid dreams, REM behavior or sleepwalking     History of sleep apnea?  Patient denies   Any hygiene concerns? Endorsed, he doesn't want to shower, he reports that he does it once every 2 weeks , has decreased interest in changing clothes Independent of bathing and dressing?  Endorsed  Does the patient needs help with medications? Daughter is in charge because he was forgetting doses Who is in charge of the finances? Daughter is in charge  Any changes in appetite?  Patient denies, his daughter prepares meals for him Patient have trouble swallowing? Patient denies   Does the patient cook?  Patient denies   Any kitchen accidents such as leaving the stove on? Patient denies   Any headaches?  Patient denies   Double vision? Patient denies   Any focal numbness or tingling?  Patient denies   Chronic back pain Patient denies   Unilateral weakness?  Patient denies   Any tremors?  Patient denies   Any history of anosmia?  Patient denies   Any incontinence of urine?  Patient denies   Any bowel dysfunction?   Patient denies   History of heavy alcohol intake?  Patient denies   History of heavy tobacco use?  Patient denies   Family history of dementia?  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 *Need appointment for future refills.*   Blood Glucose  Monitoring Suppl (ONE TOUCH ULTRA 2) w/Device KIT Use glucometer to test sugars once daily and as needed. Dx E11.9   donepezil (ARICEPT) 10 MG tablet ake half tablet (5 mg) daily for 2 weeks, then increase to the full tablet at 10 mg daily.   glucose blood (ONETOUCH ULTRA) test strip Use to check sugar daily and as needed.  Dx Code: E11.9   metFORMIN (GLUCOPHAGE) 500 MG tablet Take 1 tablet (500 mg total) by mouth 2 (two) times daily with a meal.   OneTouch Delica Lancets 33G MISC Use as directed once daily and as needed.  Dx Code: E11.9   No facility-administered encounter medications on file as of  09/22/2023.       09/22/2023    5:00 PM 10/13/2016    8:47 AM  MMSE - Mini Mental State Exam  Orientation to time 0 5  Orientation to Place 4 5  Registration 2 3  Attention/ Calculation 4 5  Recall 3 3  Language- name 2 objects 2 2  Language- repeat 1 1  Language- follow 3 step command 3 3  Language- read & follow direction 1 0  Language-read & follow direction-comments  Pt speaks/reads [place].  Write a sentence 0 1  Copy design 0 1  Total score 20 29      04/09/2022   11:00 AM  Montreal Cognitive Assessment   Visuospatial/ Executive (0/5) 3  Naming (0/3) 3  Attention: Read list of digits (0/2) 1  Attention: Read list of letters (0/1) 1  Attention: Serial 7 subtraction starting at 100 (0/3) 3  Language: Repeat phrase (0/2) 1  Language : Fluency (0/1) 1  Abstraction (0/2) 0  Delayed Recall (0/5) 0  Orientation (0/6) 5  Total 18  Adjusted Score (based on education) 18    Objective:     PHYSICAL EXAMINATION:    VITALS:   Vitals:   09/22/23 1447  BP: 113/68  Pulse: 97  Resp: 18  SpO2: 97%  Weight: 121 lb (54.9 kg)  Height: 5\' 8"  (1.727 m)    GEN:  The patient appears stated age and is in NAD. HEENT:  Normocephalic, atraumatic.   Neurological examination:  General: NAD, well-groomed, appears stated age. Orientation: The patient is alert. Oriented to person,  place and not to date Cranial nerves: There is good facial symmetry.The speech is fluent and clear in [place]. No aphasia or dysarthria. Fund of knowledge is appropriate. Recent and remote memory are impaired. Attention and concentration are reduced.  Able to name objects and repeat phrases.  Hearing is intact to conversational tone.  Sensation: Sensation is intact to light touch throughout Motor: Strength is at least antigravity x4. DTR's 2/4 in UE/LE     Movement examination: Tone: There is normal tone in the UE/LE Abnormal movements:  no tremor.  No myoclonus.  No asterixis.   Coordination:  There is no decremation with RAM's. Normal finger to nose  Gait and Station: The patient has no  difficulty arising out of a deep-seated chair without the use of the hands. The patient's stride length is good.  Gait is cautious and narrow.    Thank you for allowing [place] the opportunity to participate in the care of this nice patient. Please do not hesitate to contact us for any questions or concerns.   Total time spent on today's visit was 32 minutes dedicated to this patient today, preparing to see patient, examining the patient, ordering tests and/or medications and counseling the patient, documenting clinical information in the EHR or other health record, independently interpreting results and communicating results to the patient/family, discussing treatment and goals, answering patient's questions and coordinating care.  Cc:  Bradd Canary, MD  Marlowe Kays 09/22/2023 5:08 PM

## 2023-09-22 NOTE — Patient Instructions (Addendum)
It was a pleasure to see you today at our office.   Recommendations: Start Donepezil 10 mg :Take half tablet (5 mg) daily for 2 weeks, then increase to the full tablet at 10 mg daily.   Follow up May 29 at 11:30   Whom to call:  Memory  decline, memory medications: Call our office [phone]   For psychiatric meds, mood meds: Please have your primary care physician manage these medications.   Counseling regarding caregiver distress, including caregiver depression, anxiety and issues regarding community resources, adult day care programs, adult living facilities, or memory care questions:   Feel free to contact Misty Lisabeth Register, Social Worker at [phone]   For assessment of decision of mental capacity and competency:  Call Dr. Erick Blinks, geriatric psychiatrist at [phone]  For guidance in geriatric dementia issues please call [facility] [phone]  For guidance regarding [facility] and if placement were needed at the facility, contact Sidney Ace, Social Worker tel: [phone]  If you have any severe symptoms of a stroke, or other severe issues such as confusion,severe chills or fever, etc call 911 or go to the ER as you may need to be evaluated further   Feel free to visit Facebook page " [email or web address]" for tips of how to care for people with memory problems.    Consider [facility]  [address], [place] [address] [phone]  Hours of Operation Mondays to Thursdays: 8 am to 8 pm,Fridays: 9 am to 8 pm, Saturdays: 9 am to 1 pm Sundays: Closed  [email or web address]-[email or web address]-[email or web address]    RECOMMENDATIONS FOR ALL PATIENTS WITH MEMORY PROBLEMS: 1. Continue to exercise (Recommend 30 minutes of walking everyday, or 3 hours every week) 2. Increase social interactions - continue going to [place] and enjoy social gatherings with friends and family 3. Eat  healthy, avoid fried foods and eat more fruits and vegetables 4. Maintain adequate blood pressure, blood sugar, and blood cholesterol level. Reducing the risk of stroke and cardiovascular disease also helps promoting better memory. 5. Avoid stressful situations. Live a simple life and avoid aggravations. Organize your time and prepare for the next day in anticipation. 6. Sleep well, avoid any interruptions of sleep and avoid any distractions in the bedroom that may interfere with adequate sleep quality 7. Avoid sugar, avoid sweets as there is a strong link between excessive sugar intake, diabetes, and cognitive impairment We discussed the Mediterranean diet, which has been shown to help patients reduce the risk of progressive memory disorders and reduces cardiovascular risk. This includes eating fish, eat fruits and green leafy vegetables, nuts like almonds and hazelnuts, walnuts, and also use olive oil. Avoid fast foods and fried foods as much as possible. Avoid sweets and sugar as sugar use has been linked to worsening of memory function.  There is always a concern of gradual progression of memory problems. If this is the case, then we may need to adjust level of care according to patient needs. Support, both to the patient and caregiver, should then be put into place.      You have been referred for a neuropsychological evaluation (i.e., evaluation of memory and thinking abilities). Please bring someone with you to this appointment if possible, as it is helpful for the doctor to hear from both you and another adult who knows you well. Please bring eyeglasses and hearing aids if you wear them.    The evaluation will take approximately 3 hours and has two parts:  The first part is a clinical interview with the neuropsychologist (Dr. Milbert Coulter or Dr. Roseanne Reno). During the interview, the neuropsychologist will speak with you and the individual you brought to the appointment.    The second part of the  evaluation is testing with the doctor's technician Annabelle Harman or Selena Batten). During the testing, the technician will ask you to remember different types of material, solve problems, and answer some questionnaires. Your family member will not be present for this portion of the evaluation.   Please note: We must reserve several hours of the neuropsychologist's time and the psychometrician's time for your evaluation appointment. As such, there is a No-Show fee of $100. If you are unable to attend any of your appointments, please contact our office as soon as possible to reschedule.    FALL PRECAUTIONS: Be cautious when walking. Scan the area for obstacles that may increase the risk of trips and falls. When getting up in the mornings, sit up at the edge of the bed for a few minutes before getting out of bed. Consider elevating the bed at the head end to avoid drop of blood pressure when getting up. Walk always in a well-lit room (use night lights in the walls). Avoid area rugs or power cords from appliances in the middle of the walkways. Use a walker or a cane if necessary and consider physical therapy for balance exercise. Get your eyesight checked regularly.  FINANCIAL OVERSIGHT: Supervision, especially oversight when making financial decisions or transactions is also recommended.  HOME SAFETY: Consider the safety of the kitchen when operating appliances like stoves, microwave oven, and blender. Consider having supervision and share cooking responsibilities until no longer able to participate in those. Accidents with firearms and other hazards in the house should be identified and addressed as well.   ABILITY TO BE LEFT ALONE: If patient is unable to contact 911 operator, consider using LifeLine, or when the need is there, arrange for someone to stay with patients. Smoking is a fire hazard, consider supervision or cessation. Risk of wandering should be assessed by caregiver and if detected at any point, supervision  and safe proof recommendations should be instituted.  MEDICATION SUPERVISION: Inability to self-administer medication needs to be constantly addressed. Implement a mechanism to ensure safe administration of the medications.   DRIVING: Regarding driving, in patients with progressive memory problems, driving will be impaired. We advise to have someone else do the driving if trouble finding directions or if minor accidents are reported. Independent driving assessment is available to determine safety of driving.   If you are interested in the driving assessment, you can contact the following:  [employer] in [place] [phone]  [facility] [phone]  [facility] [phone] or [phone]    Mediterranean Diet A Mediterranean diet refers to food and lifestyle choices that are based on the traditions of countries located on the [employer]. This way of eating has been shown to help prevent certain conditions and improve outcomes for people who have chronic diseases, like kidney disease and heart disease. What are tips for following this plan? Lifestyle  Cook and eat meals together with your family, when possible. Drink enough fluid to keep your urine clear or pale yellow. Be physically active every day. This includes: Aerobic exercise like running or swimming. Leisure activities like gardening, walking, or housework. Get 7-8 hours of sleep each night. If recommended by your health care provider, drink red wine in moderation. This means  1 glass a day for nonpregnant women and 2 glasses a day for men. A glass of wine equals 5 oz (150 mL). Reading food labels  Check the serving size of packaged foods. For foods such as rice and pasta, the serving size refers to the amount of cooked product, not dry. Check the total fat in packaged foods. Avoid foods that have saturated fat or trans fats. Check the  ingredients list for added sugars, such as corn syrup. Shopping  At the grocery store, buy most of your food from the areas near the walls of the store. This includes: Fresh fruits and vegetables (produce). Grains, beans, nuts, and seeds. Some of these may be available in unpackaged forms or large amounts (in bulk). Fresh seafood. Poultry and eggs. Low-fat dairy products. Buy whole ingredients instead of prepackaged foods. Buy fresh fruits and vegetables in-season from local farmers markets. Buy frozen fruits and vegetables in resealable bags. If you do not have access to quality fresh seafood, buy precooked frozen shrimp or canned fish, such as tuna, salmon, or sardines. Buy small amounts of raw or cooked vegetables, salads, or olives from the deli or salad bar at your store. Stock your pantry so you always have certain foods on hand, such as olive oil, canned tuna, canned tomatoes, rice, pasta, and beans. Cooking  Cook foods with extra-virgin olive oil instead of using butter or other vegetable oils. Have meat as a side dish, and have vegetables or grains as your main dish. This means having meat in small portions or adding small amounts of meat to foods like pasta or stew. Use beans or vegetables instead of meat in common dishes like chili or lasagna. Experiment with different cooking methods. Try roasting or broiling vegetables instead of steaming or sauteing them. Add frozen vegetables to soups, stews, pasta, or rice. Add nuts or seeds for added healthy fat at each meal. You can add these to yogurt, salads, or vegetable dishes. Marinate fish or vegetables using olive oil, lemon juice, garlic, and fresh herbs. Meal planning  Plan to eat 1 vegetarian meal one day each week. Try to work up to 2 vegetarian meals, if possible. Eat seafood 2 or more times a week. Have healthy snacks readily available, such as: Vegetable sticks with hummus. Greek yogurt. Fruit and nut trail mix. Eat  balanced meals throughout the week. This includes: Fruit: 2-3 servings a day Vegetables: 4-5 servings a day Low-fat dairy: 2 servings a day Fish, poultry, or lean meat: 1 serving a day Beans and legumes: 2 or more servings a week Nuts and seeds: 1-2 servings a day Whole grains: 6-8 servings a day Extra-virgin olive oil: 3-4 servings a day Limit red meat and sweets to only a few servings a month What are my food choices? Mediterranean diet Recommended Grains: Whole-grain pasta. Brown rice. Bulgar wheat. Polenta. Couscous. Whole-wheat bread. Orpah Cobb. Vegetables: Artichokes. Beets. Broccoli. Cabbage. Carrots. Eggplant. Green beans. Chard. Kale. Spinach. Onions. Leeks. Peas. Squash. Tomatoes. Peppers. Radishes. Fruits: Apples. Apricots. Avocado. Berries. Bananas. Cherries. Dates. Figs. Grapes. Lemons. Melon. Oranges. Peaches. Plums. Pomegranate. Meats and other protein foods: Beans. Almonds. Sunflower seeds. Pine nuts. Peanuts. Cod. Salmon. Scallops. Shrimp. Tuna. Tilapia. Clams. Oysters. Eggs. Dairy: Low-fat milk. Cheese. Greek yogurt. Beverages: Water. Red wine. Herbal tea. Fats and oils: Extra virgin olive oil. Avocado oil. Grape seed oil. Sweets and desserts: [place] yogurt with honey. Baked apples. Poached pears. Trail mix. Seasoning and other foods: Basil. Cilantro. Coriander. Cumin. Mint. Parsley. Sage. Rosemary. Tarragon.  Garlic. Oregano. Thyme. Pepper. Balsalmic vinegar. Tahini. Hummus. Tomato sauce. Olives. Mushrooms. Limit these Grains: Prepackaged pasta or rice dishes. Prepackaged cereal with added sugar. Vegetables: Deep fried potatoes (french fries). Fruits: Fruit canned in syrup. Meats and other protein foods: Beef. Pork. Lamb. Poultry with skin. Hot dogs. Tomasa Blase. Dairy: Ice cream. Sour cream. Whole milk. Beverages: Juice. Sugar-sweetened soft drinks. Beer. Liquor and spirits. Fats and oils: Butter. Canola oil. Vegetable oil. Beef fat (tallow). Lard. Sweets and desserts:  Cookies. Cakes. Pies. Candy. Seasoning and other foods: Mayonnaise. Premade sauces and marinades. The items listed may not be a complete list. Talk with your dietitian about what dietary choices are right for you. Summary The Mediterranean diet includes both food and lifestyle choices. Eat a variety of fresh fruits and vegetables, beans, nuts, seeds, and whole grains. Limit the amount of red meat and sweets that you eat. Talk with your health care provider about whether it is safe for you to drink red wine in moderation. This means 1 glass a day for nonpregnant women and 2 glasses a day for men. A glass of wine equals 5 oz (150 mL). This information is not intended to replace advice given to you by your health care provider. Make sure you discuss any questions you have with your health care provider. Document Released: 06/11/2016 Document Revised: 07/14/2016 Document Reviewed: 06/11/2016 Elsevier Interactive Patient Education  2017 [employer].

## 2023-10-07 ENCOUNTER — Ambulatory Visit: Payer: Self-pay | Admitting: Licensed Clinical Social Worker

## 2023-10-07 NOTE — Patient Instructions (Signed)
Visit Information  Thank you for taking time to visit with me today. Please don't hesitate to contact me if I can be of assistance to you.   Following are the goals we discussed today:   Goals Addressed             This Visit's Progress    Obtain Supportive Resources-Caregiver Stress   On track    Activities and task to complete in order to accomplish goals.   Keep all upcoming appointments discussed today Continue with compliance of taking medication prescribed by Doctor Implement healthy coping skills discussed to assist with management of symptoms Review resources regarding dementia/caregiver support         Our next appointment is by telephone on 01/09 at 3 PM  Please call the care guide team at [phone] if you need to cancel or reschedule your appointment.   If you are experiencing a Mental Health or Behavioral Health Crisis or need someone to talk to, please call the Suicide and Crisis Lifeline: 988 call 911   Patient verbalizes understanding of instructions and care plan provided today and agrees to view in MyChart. Active MyChart status and patient understanding of how to access instructions and care plan via MyChart confirmed with patient.     Jenel Lucks, MSW, LCSW [facility] [place]  [facility] [email or web address] Phone [phone] 4:26 PM

## 2023-10-07 NOTE — Patient Outreach (Signed)
  Care Coordination   Follow Up Visit Note   10/07/2023 Name: Marvin Mcdonald MRN: [id number] DOB: 1941-06-25  Marvin Mcdonald is a 82 y.o. year old male who sees Bradd Canary, MD for primary care. I spoke with  Marvin Mcdonald's daughter by phone today.  What matters to the patients health and wellness today?  Caregiver Stress/Support Services    Goals Addressed             This Visit's Progress    Obtain Supportive Resources-Caregiver Stress   On track    Activities and task to complete in order to accomplish goals.   Keep all upcoming appointments discussed today Continue with compliance of taking medication prescribed by Doctor Implement healthy coping skills discussed to assist with management of symptoms Review resources regarding dementia/caregiver support         SDOH assessments and interventions completed:  No     Care Coordination Interventions:  Yes, provided  Interventions Today    Flowsheet Row Most Recent Value  Chronic Disease   Chronic disease during today's visit Diabetes, Hypertension (HTN), Other  [Dementia]  General Interventions   General Interventions Discussed/Reviewed General Interventions Reviewed, Doctor Visits, Surveyor, minerals Support]  Doctor Visits Discussed/Reviewed Doctor Visits Reviewed  Mental Health Interventions   Mental Health Discussed/Reviewed Mental Health Reviewed, Coping Strategies  [Self Care]  Pharmacy Interventions   Pharmacy Dicussed/Reviewed Pharmacy Topics Reviewed, Medication Adherence  Safety Interventions   Safety Discussed/Reviewed Safety Reviewed       Follow up plan: Follow up call scheduled for 4-6 weeks    Encounter Outcome:  Patient Visit Completed   Jenel Lucks, MSW, LCSW [facility] Care Management [facility]  [email or web address] [email or web address] Phone [phone] 4:22 PM

## 2023-11-11 ENCOUNTER — Ambulatory Visit: Payer: Self-pay | Admitting: Licensed Clinical Social Worker

## 2023-11-16 NOTE — Patient Instructions (Signed)
 Visit Information  Thank you for taking time to visit with me today. Please don't hesitate to contact me if I can be of assistance to you before our next scheduled telephone appointment.  Following are the goals we discussed today:  (Copy and paste patient goals from clinical care plan here)  Please call the care guide team at [phone] if you need to cancel or reschedule your appointment.   If you are experiencing a Mental Health or Behavioral Health Crisis or need someone to talk to, please call the Suicide and Crisis Lifeline: 988 call 911   Patient verbalizes understanding of instructions and care plan provided today and agrees to view in MyChart. Active MyChart status and patient understanding of how to access instructions and care plan via MyChart confirmed with patient.     No further follow up required: Family will inform PCP if any additional needs arise.  Allena Pietila, MSW, LCSW [place]  [facility] [email or web address] Phone [phone] 5:28 PM

## 2023-11-16 NOTE — Patient Outreach (Signed)
  Care Coordination   Follow Up Visit Note   11/11/2023 Name: Marvin Mcdonald MRN: [id number] DOB: January 26, 1941  Marvin Mcdonald is a 83 y.o. year old male who sees Domenica Harlene LABOR, MD for primary care. I spoke with  Marvin Erick Harlan's daughter by phone today.  What matters to the patients health and wellness today?  Caregiver Stress/Level of Care/Supportive Resources    Goals Addressed             This Visit's Progress    COMPLETED: Obtain Supportive Resources-Caregiver Stress       Activities and task to complete in order to accomplish goals.   Keep all upcoming appointments discussed today Continue with compliance of taking medication prescribed by Doctor Implement healthy coping skills discussed to assist with management of symptoms Review resources regarding dementia/caregiver support         SDOH assessments and interventions completed:  Yes  SDOH Interventions Today    Flowsheet Row Most Recent Value  SDOH Interventions   Food Insecurity Interventions Intervention Not Indicated, Other (Comment)  Housing Interventions Intervention Not Indicated, Other (Comment)  Transportation Interventions Intervention Not Indicated  Utilities Interventions Intervention Not Indicated        Care Coordination Interventions:  Yes, provided  Interventions Today    Flowsheet Row Most Recent Value  Chronic Disease   Chronic disease during today's visit Diabetes, Other  [Dementia]  General Interventions   General Interventions Discussed/Reviewed General Interventions Reviewed, Doctor Visits, Level of Care  Doctor Visits Discussed/Reviewed Doctor Visits Reviewed       Follow up plan: No further intervention required.   Encounter Outcome:  Patient Visit Completed   Rolin Kerns, MSW, LCSW [facility] Care Management [facility]  [email or web address] Phone [phone] 5:27 PM

## 2024-03-03 DIAGNOSIS — H40013 Open angle with borderline findings, low risk, bilateral: Secondary | ICD-10-CM | POA: Diagnosis not present

## 2024-03-03 DIAGNOSIS — E113393 Type 2 diabetes mellitus with moderate nonproliferative diabetic retinopathy without macular edema, bilateral: Secondary | ICD-10-CM | POA: Diagnosis not present

## 2024-03-03 DIAGNOSIS — H04123 Dry eye syndrome of bilateral lacrimal glands: Secondary | ICD-10-CM | POA: Diagnosis not present

## 2024-03-03 DIAGNOSIS — H524 Presbyopia: Secondary | ICD-10-CM | POA: Diagnosis not present

## 2024-03-03 DIAGNOSIS — H43391 Other vitreous opacities, right eye: Secondary | ICD-10-CM | POA: Diagnosis not present

## 2024-03-03 LAB — HM DIABETES EYE EXAM

## 2024-03-30 ENCOUNTER — Ambulatory Visit: Payer: Medicare Other | Admitting: Physician Assistant

## 2024-03-30 ENCOUNTER — Other Ambulatory Visit (HOSPITAL_BASED_OUTPATIENT_CLINIC_OR_DEPARTMENT_OTHER): Payer: Self-pay

## 2024-03-30 ENCOUNTER — Encounter: Payer: Self-pay | Admitting: Physician Assistant

## 2024-03-30 VITALS — BP 100/62 | HR 78 | Resp 20 | Ht 68.0 in | Wt 114.0 lb

## 2024-03-30 DIAGNOSIS — R413 Other amnesia: Secondary | ICD-10-CM | POA: Diagnosis not present

## 2024-03-30 MED ORDER — DONEPEZIL HCL 10 MG PO TABS
10.0000 mg | ORAL_TABLET | Freq: Every day | ORAL | 3 refills | Status: DC
  Filled 2024-03-30: qty 90, 90d supply, fill #0
  Filled 2024-11-21: qty 90, 90d supply, fill #1

## 2024-03-30 NOTE — Progress Notes (Signed)
 Assessment/Plan:   Memory impairment of unclear etiology   Marvin Mcdonald is a very pleasant 83 y.o. RH male [place] speaking male with a history of hypertension, hyperlipidemia , RLL mass, DM2 seen today in follow up for memory loss. Patient has not been compliant with  donepezil  10 mg daily.  Etiology of the memory is still unclear, suspect a psychiatric component as well. MMSE today is 18/30.  He is able to participate on his ADLs but there is a lack of interest in participating in activities outside of the home. His appetite is poor.      Follow up in 6  months. Resume  donepezil  10 mg daily. Recommend good control of her cardiovascular risk factors Continue to control mood as per PCP  Recommend HHN versus ADP for socialization and cognitive stimulation      Subjective:    This patient is accompanied in the office by his daughter  who supplements the history.  Previous records as well as any outside records available were reviewed prior to todays visit. Patient was last seen on 09/22/2019 for with MMSE of 20/30    Any changes in memory since last visit? "A little worse".  Continues to have difficulty with short-term memory including remembering recent conversations, new information or names per daughter's report. LTM is good repeats oneself?  Endorsed Disoriented when walking into a room? Denies    Leaving objects?  Quite often, but not in unusual places   Wandering behavior?  Denies.  Any personality changes since last visit?  "He was bored and was taking things out of the fridge, kicking, et so he had to leave our house" "  Any worsening depression?:  Denies.   Hallucinations or paranoia?  Denies.   Seizures? denies    Any sleep changes?  Sleeps well.  Denies vivid dreams, REM behavior or sleepwalking   Sleep apnea?   Denies.   Any hygiene concerns?  Daughter has to remind him to shower.  Independent of bathing and dressing?  Endorsed  Does the patient needs help with  medications?  Daughter is in charge   Who is in charge of the finances?   Daughter  is in charge     Any changes in appetite?  Not eating much, he has lost >10 lbs  for the last 6 months      Patient have trouble swallowing? Denies.   Does the patient cook? No, daughter brings him the food. Any headaches?   Denies.   Any vision changes? Denies  Chronic back pain  denies.   Ambulates with difficulty? Denies.    Recent falls or head injuries? Denies.     Unilateral weakness, numbness or tingling? denies   Any tremors?  Denies   Any anosmia?  Denies   Any incontinence of urine?  Endorsed   Any bowel dysfunction?   Denies      Patient lived with his daughter " but recently moved out to his apartment, because he was causing too much trouble with my kids". He now  lives alone    Does the patient drive? 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 For about 6-8 months  HE has issues with STM and learning new tasks. His daughter has to "  teach him how to use the remote and the cell phone several times". He also forgets appointments  Patient lives with:  Trying to sell the house, so he is "figuring things". He is likely to live with a friend for safety. Social work is involved in all of his ADLs needs. repeats oneself? Endorsed  Disoriented when walking into a room?  Patient denies   Leaving objects in unusual places?  Patient denies   Ambulates  with difficulty?   Patient denies , but admits to not walking enough Recent falls?  Patient denies   Any head injuries?  Patient denies   History of seizures?   Patient denies   Wandering behavior?  Patient denies   Patient drives?   He continues to drive, and his daughter noted a decreased response versus less attentiveness, but does short  distances only  Any mood changes "no really serious mood changes"-daughter says  Any history of depression?:   Endorsed "to a certain degree since his wife died". She died suddenly last 09-26-24. Since then he may feel "down", "A [employer] lady comes to care for him and keeps him company".  Hallucinations?  Patient denies   Paranoia?  Patient denies   Patient reports that he sleeps well without vivid dreams, REM behavior or sleepwalking     History of sleep apnea?  Patient denies   Any hygiene concerns? Endorsed, he doesn't want to shower, he reports that he does it once every 2 weeks , has decreased interest in changing clothes Independent of bathing and dressing?  Endorsed  Does the patient needs help with medications? Daughter is in charge because he was forgetting doses Who is in charge of the finances? Daughter is in charge  Any changes in appetite?  Patient denies, his daughter prepares meals for him Patient have trouble swallowing? Patient denies   Does the patient cook?  Patient denies   Any kitchen accidents such as leaving the stove on? Patient denies   Any headaches?  Patient denies   Double vision? Patient denies   Any focal numbness or tingling?  Patient denies   Chronic back pain Patient denies   Unilateral weakness?  Patient denies   Any tremors?  Patient denies   Any history of anosmia?  Patient denies   Any incontinence of urine?  Patient denies   Any bowel dysfunction?   Patient denies   History of heavy alcohol intake?  Patient denies   History of heavy tobacco use?  Patient denies   Family history of dementia?  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 *Need appointment for future refills.*   Blood Glucose Monitoring Suppl  (ONE TOUCH ULTRA 2) w/Device KIT Use glucometer to test sugars once daily and as needed. Dx E11.9   glucose blood (ONETOUCH ULTRA) test strip Use to check sugar daily and as needed.  Dx Code: E11.9   metFORMIN  (GLUCOPHAGE ) 500 MG tablet Take 1 tablet (500 mg total) by mouth 2 (two) times daily with a meal.   OneTouch Delica Lancets 33G MISC Use as directed once daily and as needed.  Dx Code: E11.9   [DISCONTINUED] donepezil  (ARICEPT ) 10 MG tablet ake half tablet (5 mg) daily for 2 weeks, then increase to the  full tablet at 10 mg daily.   donepezil  (ARICEPT ) 10 MG tablet Take 1 tablet (10 mg total) by mouth daily.   No facility-administered encounter medications on file as of 03/30/2024.       03/30/2024   12:00 PM 09/22/2023    5:00 PM 10/13/2016    8:47 AM  MMSE - Mini Mental State Exam  Orientation to time 0 0 5  Orientation to Place 2 4 5   Registration 3 2 3   Attention/ Calculation 5 4 5   Recall 0 3 3  Language- name 2 objects 2 2 2   Language- repeat 0 1 1  Language- follow 3 step command 3 3 3   Language- read & follow direction 1 1 0  Language-read & follow direction-comments   Pt speaks/reads [place].  Write a sentence 1 0 1  Copy design 1 0 1  Total score 18 20 29       04/09/2022   11:00 AM  Montreal Cognitive Assessment   Visuospatial/ Executive (0/5) 3  Naming (0/3) 3  Attention: Read list of digits (0/2) 1  Attention: Read list of letters (0/1) 1  Attention: Serial 7 subtraction starting at 100 (0/3) 3  Language: Repeat phrase (0/2) 1  Language : Fluency (0/1) 1  Abstraction (0/2) 0  Delayed Recall (0/5) 0  Orientation (0/6) 5  Total 18  Adjusted Score (based on education) 18    Objective:     PHYSICAL EXAMINATION:    VITALS:   Vitals:   03/30/24 1118  BP: 100/62  Pulse: 78  Resp: 20  SpO2: 98%  Weight: 114 lb (51.7 kg)  Height: 5\' 8"  (1.727 m)    GEN:  The patient appears stated age and is in NAD. HEENT:  Normocephalic, atraumatic.    Neurological examination:  General: NAD, well-groomed, appears stated age. Orientation: The patient is alert. Oriented to person, not to place and date Cranial nerves: There is good facial symmetry.The speech is fluent and clear in [place]. No aphasia or dysarthria. Fund of knowledge is appropriate. Recent and remote memory are impaired. Attention and concentration are reduced. Able to name objects and repeat phrases.  Hearing is intact to conversational tone.   Sensation: Sensation is intact to light touch throughout Motor: Strength is at least antigravity x4. DTR's 2/4 in UE/LE     Movement examination: Tone: There is normal tone in the UE/LE Abnormal movements:  no tremor.  No myoclonus.  No asterixis.   Coordination:  There is no decremation with RAM's. Normal finger to nose  Gait and Station: The patient has no difficulty arising out of a deep-seated chair without the use of the hands. The patient's stride length is good.  Gait is cautious and narrow.    Thank you for allowing us  the opportunity to participate in the care of this nice patient. Please do not hesitate to contact us  for any questions or concerns.   Total time spent on today's visit was 35 minutes dedicated to this patient today, preparing to see patient, examining the patient, ordering tests and/or medications and counseling the patient, documenting clinical information in the EHR or other health record, independently interpreting results and communicating results to the patient/family, discussing treatment and goals, answering patient's questions and coordinating care.  Cc:  Neda Balk, MD  Tex Filbert 03/30/2024 12:40 PM

## 2024-03-30 NOTE — Patient Instructions (Addendum)
 It was a pleasure to see you today at our office.   Recommendations:  Donepezil  10 mg daily Follow up Dec 16 at 11:30  Talk with your doctor about appetite stimulant medicine  Whom to call:  Memory  decline, memory medications: Call our office [phone]   For psychiatric meds, mood meds: Please have your primary care physician manage these medications.      For assessment of decision of mental capacity and competency:  Call Dr. Laverne Potter, geriatric psychiatrist at [phone]  For guidance in geriatric dementia issues please call [facility] [phone]   If you have any severe symptoms of a stroke, or other severe issues such as confusion,severe chills or fever, etc call 911 or go to the ER as you may need to be evaluated further   Feel free to visit Facebook page " [email or web address]" for tips of how to care for people with memory problems.    Consider [facility]  [address], [place] [address] [phone]  Hours of Operation Mondays to Thursdays: 8 am to 8 pm,Fridays: 9 am to 8 pm, Saturdays: 9 am to 1 pm Sundays: Closed  [email or web address]    RECOMMENDATIONS FOR ALL PATIENTS WITH MEMORY PROBLEMS: 1. Continue to exercise (Recommend 30 minutes of walking everyday, or 3 hours every week) 2. Increase social interactions - continue going to [place] and enjoy social gatherings with friends and family 3. Eat healthy, avoid fried foods and eat more fruits and vegetables 4. Maintain adequate blood pressure, blood sugar, and blood cholesterol level. Reducing the risk of stroke and cardiovascular disease also helps promoting better memory. 5. Avoid stressful situations. Live a simple life and avoid aggravations. Organize your time and prepare for the next day in anticipation. 6. Sleep well, avoid any interruptions of sleep and avoid any distractions in the bedroom that may  interfere with adequate sleep quality 7. Avoid sugar, avoid sweets as there is a strong link between excessive sugar intake, diabetes, and cognitive impairment We discussed the Mediterranean diet, which has been shown to help patients reduce the risk of progressive memory disorders and reduces cardiovascular risk. This includes eating fish, eat fruits and green leafy vegetables, nuts like almonds and hazelnuts, walnuts, and also use olive oil. Avoid fast foods and fried foods as much as possible. Avoid sweets and sugar as sugar use has been linked to worsening of memory function.  There is always a concern of gradual progression of memory problems. If this is the case, then we may need to adjust level of care according to patient needs. Support, both to the patient and caregiver, should then be put into place.      You have been referred for a neuropsychological evaluation (i.e., evaluation of memory and thinking abilities). Please bring someone with you to this appointment if possible, as it is helpful for the doctor to hear from both you and another adult who knows you well. Please bring eyeglasses and hearing aids if you wear them.    The evaluation will take approximately 3 hours and has two parts:   The first part is a clinical interview with the neuropsychologist (Dr. Kitty Perkins or Dr. Annette Barters). During the interview, the neuropsychologist will speak with you and the individual you brought to the appointment.    The second part of the evaluation is testing with the doctor's technician Bernabe Brew or Burdette Carolin). During the testing, the technician will ask you to remember different types of material, solve problems, and answer some  questionnaires. Your family member will not be present for this portion of the evaluation.   Please note: We must reserve several hours of the neuropsychologist's time and the psychometrician's time for your evaluation appointment. As such, there is a No-Show fee of $100. If you are  unable to attend any of your appointments, please contact our office as soon as possible to reschedule.    FALL PRECAUTIONS: Be cautious when walking. Scan the area for obstacles that may increase the risk of trips and falls. When getting up in the mornings, sit up at the edge of the bed for a few minutes before getting out of bed. Consider elevating the bed at the head end to avoid drop of blood pressure when getting up. Walk always in a well-lit room (use night lights in the walls). Avoid area rugs or power cords from appliances in the middle of the walkways. Use a walker or a cane if necessary and consider physical therapy for balance exercise. Get your eyesight checked regularly.  FINANCIAL OVERSIGHT: Supervision, especially oversight when making financial decisions or transactions is also recommended.  HOME SAFETY: Consider the safety of the kitchen when operating appliances like stoves, microwave oven, and blender. Consider having supervision and share cooking responsibilities until no longer able to participate in those. Accidents with firearms and other hazards in the house should be identified and addressed as well.   ABILITY TO BE LEFT ALONE: If patient is unable to contact 911 operator, consider using LifeLine, or when the need is there, arrange for someone to stay with patients. Smoking is a fire hazard, consider supervision or cessation. Risk of wandering should be assessed by caregiver and if detected at any point, supervision and safe proof recommendations should be instituted.  MEDICATION SUPERVISION: Inability to self-administer medication needs to be constantly addressed. Implement a mechanism to ensure safe administration of the medications.   DRIVING: Regarding driving, in patients with progressive memory problems, driving will be impaired. We advise to have someone else do the driving if trouble finding directions or if minor accidents are reported. Independent driving assessment  is available to determine safety of driving.   If you are interested in the driving assessment, you can contact the following:  [employer] in [place] [phone]  [facility] [phone]  [facility] [phone] or [phone]    Mediterranean Diet A Mediterranean diet refers to food and lifestyle choices that are based on the traditions of countries located on the [employer]. This way of eating has been shown to help prevent certain conditions and improve outcomes for people who have chronic diseases, like kidney disease and heart disease. What are tips for following this plan? Lifestyle  Cook and eat meals together with your family, when possible. Drink enough fluid to keep your urine clear or pale yellow. Be physically active every day. This includes: Aerobic exercise like running or swimming. Leisure activities like gardening, walking, or housework. Get 7-8 hours of sleep each night. If recommended by your health care provider, drink red wine in moderation. This means 1 glass a day for nonpregnant women and 2 glasses a day for men. A glass of wine equals 5 oz (150 mL). Reading food labels  Check the serving size of packaged foods. For foods such as rice and pasta, the serving size refers to the amount of cooked product, not dry. Check the total fat in packaged foods. Avoid foods that have saturated fat or trans fats.  Check the ingredients list for added sugars, such as corn syrup. Shopping  At the grocery store, buy most of your food from the areas near the walls of the store. This includes: Fresh fruits and vegetables (produce). Grains, beans, nuts, and seeds. Some of these may be available in unpackaged forms or large amounts (in bulk). Fresh seafood. Poultry and eggs. Low-fat dairy products. Buy whole ingredients instead of prepackaged foods. Buy fresh fruits and vegetables in-season  from local farmers markets. Buy frozen fruits and vegetables in resealable bags. If you do not have access to quality fresh seafood, buy precooked frozen shrimp or canned fish, such as tuna, salmon, or sardines. Buy small amounts of raw or cooked vegetables, salads, or olives from the deli or salad bar at your store. Stock your pantry so you always have certain foods on hand, such as olive oil, canned tuna, canned tomatoes, rice, pasta, and beans. Cooking  Cook foods with extra-virgin olive oil instead of using butter or other vegetable oils. Have meat as a side dish, and have vegetables or grains as your main dish. This means having meat in small portions or adding small amounts of meat to foods like pasta or stew. Use beans or vegetables instead of meat in common dishes like chili or lasagna. Experiment with different cooking methods. Try roasting or broiling vegetables instead of steaming or sauteing them. Add frozen vegetables to soups, stews, pasta, or rice. Add nuts or seeds for added healthy fat at each meal. You can add these to yogurt, salads, or vegetable dishes. Marinate fish or vegetables using olive oil, lemon juice, garlic, and fresh herbs. Meal planning  Plan to eat 1 vegetarian meal one day each week. Try to work up to 2 vegetarian meals, if possible. Eat seafood 2 or more times a week. Have healthy snacks readily available, such as: Vegetable sticks with hummus. Greek yogurt. Fruit and nut trail mix. Eat balanced meals throughout the week. This includes: Fruit: 2-3 servings a day Vegetables: 4-5 servings a day Low-fat dairy: 2 servings a day Fish, poultry, or lean meat: 1 serving a day Beans and legumes: 2 or more servings a week Nuts and seeds: 1-2 servings a day Whole grains: 6-8 servings a day Extra-virgin olive oil: 3-4 servings a day Limit red meat and sweets to only a few servings a month What are my food choices? Mediterranean diet Recommended Grains:  Whole-grain pasta. Brown rice. Bulgar wheat. Polenta. Couscous. Whole-wheat bread. Dwyane Glad. Vegetables: Artichokes. Beets. Broccoli. Cabbage. Carrots. Eggplant. Green beans. Chard. Kale. Spinach. Onions. Leeks. Peas. Squash. Tomatoes. Peppers. Radishes. Fruits: Apples. Apricots. Avocado. Berries. Bananas. Cherries. Dates. Figs. Grapes. Lemons. Melon. Oranges. Peaches. Plums. Pomegranate. Meats and other protein foods: Beans. Almonds. Sunflower seeds. Pine nuts. Peanuts. Cod. Salmon. Scallops. Shrimp. Tuna. Tilapia. Clams. Oysters. Eggs. Dairy: Low-fat milk. Cheese. Greek yogurt. Beverages: Water. Red wine. Herbal tea. Fats and oils: Extra virgin olive oil. Avocado oil. Grape seed oil. Sweets and desserts: [place] yogurt with honey. Baked apples. Poached pears. Trail mix. Seasoning and other foods: Basil. Cilantro. Coriander. Cumin. Mint. Parsley. Sage. Rosemary. Tarragon. Garlic. Oregano. Thyme. Pepper. Balsalmic vinegar. Tahini. Hummus. Tomato sauce. Olives. Mushrooms. Limit these Grains: Prepackaged pasta or rice dishes. Prepackaged cereal with added sugar. Vegetables: Deep fried potatoes (french fries). Fruits: Fruit canned in syrup. Meats and other protein foods: Beef. Pork. Lamb. Poultry with skin. Hot dogs. Helene Loader. Dairy: Ice cream. Sour cream. Whole milk. Beverages: Juice. Sugar-sweetened soft drinks. Beer. Liquor and spirits. Fats and oils:  Butter. Canola oil. Vegetable oil. Beef fat (tallow). Lard. Sweets and desserts: Cookies. Cakes. Pies. Candy. Seasoning and other foods: Mayonnaise. Premade sauces and marinades. The items listed may not be a complete list. Talk with your dietitian about what dietary choices are right for you. Summary The Mediterranean diet includes both food and lifestyle choices. Eat a variety of fresh fruits and vegetables, beans, nuts, seeds, and whole grains. Limit the amount of red meat and sweets that you eat. Talk with your health care provider about  whether it is safe for you to drink red wine in moderation. This means 1 glass a day for nonpregnant women and 2 glasses a day for men. A glass of wine equals 5 oz (150 mL). This information is not intended to replace advice given to you by your health care provider. Make sure you discuss any questions you have with your health care provider. Document Released: 06/11/2016 Document Revised: 07/14/2016 Document Reviewed: 06/11/2016 Elsevier Interactive Patient Education  2017 [employer].

## 2024-04-03 ENCOUNTER — Other Ambulatory Visit (HOSPITAL_BASED_OUTPATIENT_CLINIC_OR_DEPARTMENT_OTHER): Payer: Self-pay

## 2024-05-25 ENCOUNTER — Telehealth: Payer: Self-pay | Admitting: Family Medicine

## 2024-05-25 NOTE — Telephone Encounter (Signed)
 Copied from CRM [id number]. Topic: Referral - Question >> May 25, 2024  1:58 PM Jasmin G wrote: Reason for CRM: Pt daughter states that she called about 2-3 days ago regarding the need for pt to get home health services, pt pt has dementia and needs the help, she would also like to be informed about resources to mae this service something affordable as pt doesn't have health insurance, pt daughter was unsure if clinic would have to reach a Child psychotherapist or what would be the next course of action. Please call daughter back at Phone number on file, best time to reach her is in the morning around 9-10, and it's okay to leave voicemail.

## 2024-05-31 NOTE — Assessment & Plan Note (Signed)
 Tolerating statin, encouraged heart healthy diet, avoid trans fats, minimize simple carbs and saturated fats. Increase exercise as tolerated

## 2024-05-31 NOTE — Assessment & Plan Note (Signed)
Encouraged to get adequate exercise, calcium and vitamin d intake. He declines dexa scan

## 2024-05-31 NOTE — Assessment & Plan Note (Signed)
[id number] acceptable at last draw but has climbed up start Metformin 500 mg po daily, minimize simple carbs. Increase exercise as tolerated. Continue current meds

## 2024-05-31 NOTE — Assessment & Plan Note (Signed)
 Patient encouraged to maintain heart healthy diet, regular exercise, adequate sleep. Consider daily probiotics. Take medications as prescribed. Labs ordered and reviewed. He continues to decline referral to gastroenterology despite being about to age out of screening colonoscopies. He declines Shingrix  shots. He is due for tetanus next July but he is advised to take it early if he gets injured.

## 2024-05-31 NOTE — Assessment & Plan Note (Signed)
 Well controlled, no changes to meds. Encouraged heart healthy diet such as the DASH diet and exercise as tolerated.

## 2024-05-31 NOTE — Assessment & Plan Note (Signed)
 Doing well with support of family.

## 2024-06-01 ENCOUNTER — Ambulatory Visit: Admitting: Family Medicine

## 2024-06-01 ENCOUNTER — Other Ambulatory Visit (HOSPITAL_BASED_OUTPATIENT_CLINIC_OR_DEPARTMENT_OTHER): Payer: Self-pay

## 2024-06-01 ENCOUNTER — Encounter: Payer: Self-pay | Admitting: Family Medicine

## 2024-06-01 VITALS — BP 116/68 | HR 88 | Resp 16 | Ht 68.0 in | Wt 105.0 lb

## 2024-06-01 DIAGNOSIS — E118 Type 2 diabetes mellitus with unspecified complications: Secondary | ICD-10-CM | POA: Diagnosis not present

## 2024-06-01 DIAGNOSIS — F03A0 Unspecified dementia, mild, without behavioral disturbance, psychotic disturbance, mood disturbance, and anxiety: Secondary | ICD-10-CM | POA: Diagnosis not present

## 2024-06-01 DIAGNOSIS — I10 Essential (primary) hypertension: Secondary | ICD-10-CM | POA: Diagnosis not present

## 2024-06-01 DIAGNOSIS — H9190 Unspecified hearing loss, unspecified ear: Secondary | ICD-10-CM | POA: Diagnosis not present

## 2024-06-01 DIAGNOSIS — M810 Age-related osteoporosis without current pathological fracture: Secondary | ICD-10-CM

## 2024-06-01 DIAGNOSIS — Z0001 Encounter for general adult medical examination with abnormal findings: Secondary | ICD-10-CM

## 2024-06-01 DIAGNOSIS — Z636 Dependent relative needing care at home: Secondary | ICD-10-CM | POA: Diagnosis not present

## 2024-06-01 DIAGNOSIS — R972 Elevated prostate specific antigen [PSA]: Secondary | ICD-10-CM | POA: Diagnosis not present

## 2024-06-01 DIAGNOSIS — E782 Mixed hyperlipidemia: Secondary | ICD-10-CM | POA: Diagnosis not present

## 2024-06-01 DIAGNOSIS — Z0000 Encounter for general adult medical examination without abnormal findings: Secondary | ICD-10-CM

## 2024-06-01 DIAGNOSIS — R350 Frequency of micturition: Secondary | ICD-10-CM | POA: Diagnosis not present

## 2024-06-01 LAB — URINALYSIS, ROUTINE W REFLEX MICROSCOPIC
Bilirubin Urine: NEGATIVE
Hgb urine dipstick: NEGATIVE
Ketones, ur: NEGATIVE
Leukocytes,Ua: NEGATIVE
Nitrite: NEGATIVE
RBC / HPF: NONE SEEN (ref 0–?)
Specific Gravity, Urine: >=1.03 — AB (ref 1.000–1.030)
Total Protein, Urine: 30 — AB
Urine Glucose: NEGATIVE
Urobilinogen, UA: 0.2 (ref 0.0–1.0)
pH: 5.5 (ref 5.0–8.0)

## 2024-06-01 LAB — MICROALBUMIN / CREATININE URINE RATIO
Creatinine,U: 156.8 mg/dL
Microalb Creat Ratio: 13.2 mg/g (ref 0.0–30.0)
Microalb, Ur: 2.1 mg/dL — ABNORMAL HIGH (ref 0.0–1.9)

## 2024-06-01 MED ORDER — BOOSTRIX 5-2.5-18.5 LF-MCG/0.5 IM SUSY
0.5000 mL | PREFILLED_SYRINGE | Freq: Once | INTRAMUSCULAR | 0 refills | Status: AC
  Filled 2024-06-01: qty 0.5, 1d supply, fill #0

## 2024-06-01 NOTE — Progress Notes (Signed)
 Subjective:    Patient ID: Marvin Mcdonald, male    DOB: 1940-11-27, 83 y.o.   MRN: [id number]  Chief Complaint  Patient presents with   Annual Exam    Patient presents today for a physical exam.   Quality Metric Gaps    AWV, zoster, TDAP    HPI Discussed the use of AI scribe software for clinical note transcription with the patient, who gave verbal consent to proceed.  History of Present Illness The patient is an 83 year old with dementia and diabetes who presents with concerns about home care and constipation. He is accompanied by his daughter who is his primary care giver.   He is experiencing challenges with home care and requires assistance with daily living activities. He lives in an apartment and needs help with meal preparation and personal care. His daughter reports that he cannot manage self-care tasks independently and has a history of disruptive behavior when living with her, affecting family dynamics. She does work with him daily and bring him meals  He has a history of constipation, with the last bowel movement occurring 4-5 days ago. He attempts to have a bowel movement multiple times a day without success. His daughter suspects constipation, as he has been complaining about it. No urinary issues or blood in stool are reported.  He has experienced weight loss and poor nutrition, preferring snacks, particularly sweet ones, over regular meals. His daughter provides meals, but he often does not consume them. He has been given protein supplements, but he consumes them rapidly, which is not sustainable.  He has diabetes and is prescribed metformin , which he is reportedly not taking regularly. He is also prescribed atorvastatin  for cholesterol management, which he is not taking consistently. His daughter is concerned about his medication adherence.  He has hearing difficulties and has been provided with hearing aids in the past, but they are often misplaced.  He has a history of  prostate issues, with previous blood work showing elevated PSA levels.  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 He has decided against further invasive diagnostic testing due to his age and lack of symptoms.  He has dementia, which complicates his ability to communicate effectively about his symptoms and needs. His daughter reports that he is not a reliable historian and often does not express discomfort or issues.    Past Medical History:  Diagnosis Date   Acute URI 01/03/2013   BPH (benign prostatic hypertrophy)    Colon cancer screening 04/01/2017   Colon polyps    Dental infection 10/07/2013   Diabetes mellitus    diet and exercise controlled   Hematuria 04/07/2013   Hyperlipidemia    Low back pain    Mass of neck 10/07/2013   Osteoporosis    Pain in joint, shoulder region 10/07/2013    Past Surgical History:  Procedure Laterality Date   COLONOSCOPY     HAND SURGERY     POLYPECTOMY      Family History  Problem Relation Age of Onset   Hypertension Sister    Hypertension Brother    Depression Paternal Grandmother    Hypertension Brother    Hypertension Brother    Colon cancer Neg Hx    Stomach cancer Neg Hx    Esophageal cancer Neg Hx     Social History   Socioeconomic History   Marital status: Widowed    Spouse name: Young [place]   Number of children: 2  Years of education: 52   Highest education level: 12th grade  Occupational History    Employer: [employer]   Tobacco Use   Smoking status: Former    Current packs/day: 0.00    Average packs/day: 0.5 packs/day for 25.0 years (12.5 ttl pk-yrs)    Types: Cigarettes    Start date: 07/28/1971    Quit date: 07/27/1996    Years since quitting: 27.8    Passive exposure: Past   Smokeless tobacco: Never   Tobacco comments:    Verified by Daughter, Eun Smithey  Vaping Use   Vaping status: Never Used  Substance and Sexual Activity   Alcohol use: No    Alcohol/week: 0.0 standard  drinks of alcohol   Drug use: No   Sexual activity: Not Currently  Other Topics Concern   Not on file  Social History Narrative   Supportive daughter   Right handed   Drinks caffeine   One story home   Social Drivers of Health   Financial Resource Strain: Low Risk  (04/28/2023)   Overall Financial Resource Strain (CARDIA)    Difficulty of Paying Living Expenses: Not very hard  Food Insecurity: No Food Insecurity (11/11/2023)   Hunger Vital Sign    Worried About Running Out of Food in the Last Year: Never true    Ran Out of Food in the Last Year: Never true  Transportation Needs: No Transportation Needs (11/11/2023)   PRAPARE - Administrator, Civil Service (Medical): No    Lack of Transportation (Non-Medical): No  Physical Activity: Insufficiently Active (04/28/2023)   Exercise Vital Sign    Days of Exercise per Week: 2 days    Minutes of Exercise per Session: 20 min  Stress: No Stress Concern Present (04/28/2023)   [employer] of Occupational Health - Occupational Stress Questionnaire    Feeling of Stress : Not at all  Social Connections: Moderately Integrated (04/28/2023)   Social Connection and Isolation Panel    Frequency of Communication with Friends and Family: More than three times a week    Frequency of Social Gatherings with Friends and Family: Once a week    Attends Religious Services: More than 4 times per year    Active Member of [employer] or Organizations: Yes    Attends Banker Meetings: More than 4 times per year    Marital Status: Widowed  Intimate Partner Violence: Not At Risk (03/25/2022)   Humiliation, Afraid, Rape, and Kick questionnaire    Fear of Current or Ex-Partner: No    Emotionally Abused: No    Physically Abused: No    Sexually Abused: No    Outpatient Medications Prior to Visit  Medication Sig Dispense Refill   atorvastatin  (LIPITOR) 40 MG tablet Take 1 tablet (40 mg total) by mouth daily. *Need appointment for future  refills.* 30 tablet 0   Blood Glucose Monitoring Suppl (ONE TOUCH ULTRA 2) w/Device KIT Use glucometer to test sugars once daily and as needed. Dx E11.9 1 kit 0   donepezil  (ARICEPT ) 10 MG tablet Take 1 tablet (10 mg total) by mouth daily. 90 tablet 3   glucose blood (ONETOUCH ULTRA) test strip Use to check sugar daily and as needed.  Dx Code: E11.9 100 each 5   metFORMIN  (GLUCOPHAGE ) 500 MG tablet Take 1 tablet (500 mg total) by mouth 2 (two) times daily with a meal. 180 tablet 1   OneTouch Delica Lancets 33G MISC Use as directed once daily and as needed.  Dx Code: E11.9 100 each 5   No facility-administered medications prior to visit.    No Known Allergies  Review of Systems  Constitutional:  Positive for malaise/fatigue and weight loss. Negative for chills and fever.  HENT:  Negative for congestion and hearing loss.   Eyes:  Negative for discharge.  Respiratory:  Negative for cough, sputum production and shortness of breath.   Cardiovascular:  Negative for chest pain, palpitations and leg swelling.  Gastrointestinal:  Negative for abdominal pain, blood in stool, constipation, diarrhea, heartburn, nausea and vomiting.  Genitourinary:  Negative for dysuria, frequency, hematuria and urgency.  Musculoskeletal:  Negative for back pain, falls and myalgias.  Skin:  Negative for rash.  Neurological:  Negative for dizziness, sensory change, loss of consciousness, weakness and headaches.  Endo/Heme/Allergies:  Negative for environmental allergies. Does not bruise/bleed easily.  Psychiatric/Behavioral:  Positive for depression and memory loss. Negative for suicidal ideas. The patient is not nervous/anxious and does not have insomnia.        Objective:    Physical Exam Vitals reviewed.  Constitutional:      General: He is not in acute distress.    Appearance: Normal appearance. He is not ill-appearing or diaphoretic.  HENT:     Head: Normocephalic and atraumatic.     Right Ear: Tympanic  membrane, ear canal and external ear normal. There is no impacted cerumen.     Left Ear: Tympanic membrane, ear canal and external ear normal. There is no impacted cerumen.     Nose: Nose normal. No rhinorrhea.     Mouth/Throat:     Pharynx: Oropharynx is clear.  Eyes:     General: No scleral icterus.    Extraocular Movements: Extraocular movements intact.     Conjunctiva/sclera: Conjunctivae normal.     Pupils: Pupils are equal, round, and reactive to light.  Neck:     Thyroid : No thyroid  mass or thyroid  tenderness.  Cardiovascular:     Rate and Rhythm: Normal rate and regular rhythm.     Pulses: Normal pulses.     Heart sounds: Normal heart sounds. No murmur heard. Pulmonary:     Effort: Pulmonary effort is normal.     Breath sounds: Normal breath sounds. No wheezing.  Abdominal:     General: Bowel sounds are normal.     Palpations: Abdomen is soft. There is no mass.     Tenderness: There is no guarding.  Musculoskeletal:        General: No swelling. Normal range of motion.     Cervical back: Normal range of motion and neck supple. No rigidity.     Right lower leg: No edema.     Left lower leg: No edema.  Lymphadenopathy:     Cervical: No cervical adenopathy.  Skin:    General: Skin is warm and dry.     Findings: No rash.  Neurological:     General: No focal deficit present.     Mental Status: He is alert and oriented to person, place, and time.     Cranial Nerves: No cranial nerve deficit.     Deep Tendon Reflexes: Reflexes normal.  Psychiatric:        Mood and Affect: Mood normal.        Behavior: Behavior normal.     BP 116/68   Pulse 88   Resp 16   Ht 5' 8 (1.727 m)   Wt 105 lb (47.6 kg)   SpO2 98%   BMI 15.97  kg/m  Wt Readings from Last 3 Encounters:  06/01/24 105 lb (47.6 kg)  03/30/24 114 lb (51.7 kg)  09/22/23 121 lb (54.9 kg)    Diabetic Foot Exam - Simple   No data filed    Lab Results  Component Value Date   WBC 9.1 04/29/2023   HGB 11.8  (L) 04/29/2023   HCT 35.4 (L) 04/29/2023   PLT 238.0 04/29/2023   GLUCOSE 137 (H) 08/10/2023   CHOL 187 04/29/2023   TRIG 143.0 04/29/2023   HDL 30.40 (L) 04/29/2023   LDLDIRECT 145.0 03/13/2022   LDLCALC 128 (H) 04/29/2023   ALT 7 08/10/2023   AST 8 08/10/2023   NA 139 08/10/2023   K 3.6 08/10/2023   CL 106 08/10/2023   CREATININE 0.82 08/10/2023   BUN 12 08/10/2023   CO2 24 08/10/2023   TSH 1.00 04/29/2023   PSA 5.70 (H) 01/27/2023   HGBA1C 6.6 (H) 08/10/2023   MICROALBUR 0.50 04/05/2012    Lab Results  Component Value Date   TSH 1.00 04/29/2023   Lab Results  Component Value Date   WBC 9.1 04/29/2023   HGB 11.8 (L) 04/29/2023   HCT 35.4 (L) 04/29/2023   MCV 96.9 04/29/2023   PLT 238.0 04/29/2023   Lab Results  Component Value Date   NA 139 08/10/2023   K 3.6 08/10/2023   CO2 24 08/10/2023   GLUCOSE 137 (H) 08/10/2023   BUN 12 08/10/2023   CREATININE 0.82 08/10/2023   BILITOT 0.6 08/10/2023   ALKPHOS 100 08/10/2023   AST 8 08/10/2023   ALT 7 08/10/2023   PROT 7.5 08/10/2023   ALBUMIN 3.8 08/10/2023   CALCIUM  9.1 08/10/2023   GFR 81.84 08/10/2023   Lab Results  Component Value Date   CHOL 187 04/29/2023   Lab Results  Component Value Date   HDL 30.40 (L) 04/29/2023   Lab Results  Component Value Date   LDLCALC 128 (H) 04/29/2023   Lab Results  Component Value Date   TRIG 143.0 04/29/2023   Lab Results  Component Value Date   CHOLHDL 6 04/29/2023   Lab Results  Component Value Date   HGBA1C 6.6 (H) 08/10/2023       Assessment & Plan:  Controlled type 2 diabetes mellitus with complication, without long-term current use of insulin (HCC) Assessment & Plan: hgba1c acceptable at last draw but has climbed up start Metformin  500 mg po daily, minimize simple carbs. Increase exercise as tolerated. Continue current meds  Orders: -     Hemoglobin A1c -     Microalbumin / creatinine urine ratio -     AMB Referral [facility] Care Management  Mild  dementia without behavioral disturbance, psychotic disturbance, mood disturbance, or anxiety, unspecified dementia type (HCC) Assessment & Plan: Doing well with support of family  Orders: -     AMB Referral [facility] Care Management  Essential hypertension Assessment & Plan: Well controlled, no changes to meds. Encouraged heart healthy diet such as the DASH diet and exercise as tolerated.   Orders: -     Comprehensive metabolic panel with GFR -     CBC with Differential/Platelet  Mixed hyperlipidemia Assessment & Plan: Tolerating statin, encouraged heart healthy diet, avoid trans fats, minimize simple carbs and saturated fats. Increase exercise as tolerated  Orders: -     Lipid panel -     TSH  Osteoporosis without current pathological fracture, unspecified osteoporosis type Assessment & Plan: Encouraged to get adequate exercise, calcium  and vitamin  d intake. He declines dexa scan   Preventative health care Assessment & Plan: Patient encouraged to maintain heart healthy diet, regular exercise, adequate sleep. Consider daily probiotics. Take medications as prescribed. Labs ordered and reviewed. He continues to decline referral to gastroenterology despite being about to age out of screening colonoscopies. He declines Shingrix  shots. He is due for tetanus next July but he is advised to take it early if he gets injured.    Caregiver stress -     AMB Referral [facility] Care Management  Urinary frequency -     Urinalysis, Routine w reflex microscopic -     Urine Culture -     PSA  Elevated PSA -     PSA    Assessment and Plan Assessment & Plan Dementia Dementia with behavioral challenges, including noncompliance with medication and dietary recommendations. Difficulty distinguishing between dementia-related behaviors and personality traits. Concerns about depression exacerbating dementia symptoms. - [facility]er to [facility] team for evaluation of home resources and  benefits. - Discuss potential for hospice care consultation if needed for additional support. - Consider antidepressant therapy if he is willing to take it.  Constipation Chronic constipation with recent exacerbation, possibly related to dehydration and immobility. No bowel movement for 4-5 days. No blood in stool reported. - Administer milk of magnesia and prune juice mixture. - Administer Dulcolax suppository as needed. - Encourage daily Miralax and fiber intake. - Ensure adequate hydration.  Type 2 diabetes mellitus, poorly controlled due to nonadherence Poorly controlled type 2 diabetes mellitus due to nonadherence with metformin . Uncertain frequency of medication intake, likely minimal. - Order blood work to assess A1c levels. - Discuss importance of metformin  adherence for reducing risk of complications, including strokes and heart attacks.  Hyperlipidemia, poorly controlled due to nonadherence Poorly controlled hyperlipidemia due to nonadherence with atorvastatin . Uncertain frequency of medication intake, likely minimal. - Order blood work to assess cholesterol levels. - Discuss importance of atorvastatin  adherence for reducing risk of cardiovascular events.  Abnormal weight loss and poor oral intake Significant weight loss and poor oral intake, possibly related to dementia and lack of interest in meals. Preference for snacks over balanced meals. Risk of malnutrition and further weight loss. - Encourage intake of protein-rich foods and drinks, such as nuts, cheese sticks, and protein drinks. - Ration protein drinks to avoid excessive consumption. - Monitor weight and nutritional intake.  Unsteady gait and immobility Unsteady gait and immobility, possibly related to weight loss and muscle weakness. No current physical therapy in place. - Refer to [facility] team for evaluation of home resources and benefits. - Consider referral to home health for physical therapy  if gait does not improve.  Hearing loss Hearing loss impacting communication and possibly contributing to social isolation and depression. Concerns about potential loss of hearing aids. - Refer to [facility] for hearing evaluation. - Discuss potential benefits of hearing aids and specialized phones for improved communication.  Depression Suspected depression, possibly related to loss of autonomy and social isolation. His generation and personality may contribute to reluctance to acknowledge or treat depression. - Discuss potential for antidepressant therapy if he is willing to take it.  Recording duration: 53 minutes     Harlene Horton, MD

## 2024-06-01 NOTE — Patient Instructions (Addendum)
 Encouraged increased hydration and fiber in diet. Daily probiotics. If bowels not moving can use MOM 2 tbls po in 4 oz of warm prune juice by mouth every 2-3 days. If no results then repeat in 4 hours with  Dulcolax suppository pr, may repeat again in 4 more hours as needed. Seek care if symptoms worsen. Consider daily Miralax and/or Dulcolax if symptoms persist.   Shingrix  is the new shingles shot, 2 shots over 2-6 months, confirm coverage with insurance and document, then can return here for shots with nurse appt or at pharmacy  Tetanus is due  Preventive Care 78 Years and Older, Male Preventive care refers to lifestyle choices and visits with your health care provider that can promote health and wellness. Preventive care visits are also called wellness exams. What can I expect for my preventive care visit? Counseling During your preventive care visit, your health care provider may ask about your: Medical history, including: Past medical problems. Family medical history. History of falls. Current health, including: Emotional well-being. Home life and relationship well-being. Sexual activity. Memory and ability to understand (cognition). Lifestyle, including: Alcohol, nicotine or tobacco, and drug use. Access to firearms. Diet, exercise, and sleep habits. Work and work Astronomer. Sunscreen use. Safety issues such as seatbelt and bike helmet use. Physical exam Your health care provider will check your: Height and weight. These may be used to calculate your BMI (body mass index). BMI is a measurement that tells if you are at a healthy weight. Waist circumference. This measures the distance around your waistline. This measurement also tells if you are at a healthy weight and may help predict your risk of certain diseases, such as type 2 diabetes and high blood pressure. Heart rate and blood pressure. Body temperature. Skin for abnormal spots. What immunizations do I need?  Vaccines  are usually given at various ages, according to a schedule. Your health care provider will recommend vaccines for you based on your age, medical history, and lifestyle or other factors, such as travel or where you work. What tests do I need? Screening Your health care provider may recommend screening tests for certain conditions. This may include: Lipid and cholesterol levels. Diabetes screening. This is done by checking your blood sugar (glucose) after you have not eaten for a while (fasting). Hepatitis C test. Hepatitis B test. HIV (human immunodeficiency virus) test. STI (sexually transmitted infection) testing, if you are at risk. Lung cancer screening. Colorectal cancer screening. Prostate cancer screening. Abdominal aortic aneurysm (AAA) screening. You may need this if you are a current or former smoker. Talk with your health care provider about your test results, treatment options, and if necessary, the need for more tests. Follow these instructions at home: Eating and drinking  Eat a diet that includes fresh fruits and vegetables, whole grains, lean protein, and low-fat dairy products. Limit your intake of foods with high amounts of sugar, saturated fats, and salt. Take vitamin and mineral supplements as recommended by your health care provider. Do not drink alcohol if your health care provider tells you not to drink. If you drink alcohol: Limit how much you have to 0-2 drinks a day. Know how much alcohol is in your drink. In the [place] one drink equals one 12 oz bottle of beer (355 mL), one 5 oz glass of wine (148 mL), or one 1 oz glass of hard liquor (44 mL). Lifestyle Brush your teeth every morning and night with fluoride toothpaste. Floss one time each day. Exercise for  at least 30 minutes 5 or more days each week. Do not use any products that contain nicotine or tobacco. These products include cigarettes, chewing tobacco, and vaping devices, such as e-cigarettes. If you need  help quitting, ask your health care provider. Do not use drugs. If you are sexually active, practice safe sex. Use a condom or other form of protection to prevent STIs. Take aspirin only as told by your health care provider. Make sure that you understand how much to take and what form to take. Work with your health care provider to find out whether it is safe and beneficial for you to take aspirin daily. Ask your health care provider if you need to take a cholesterol-lowering medicine (statin). Find healthy ways to manage stress, such as: Meditation, yoga, or listening to music. Journaling. Talking to a trusted person. Spending time with friends and family. Safety Always wear your seat belt while driving or riding in a vehicle. Do not drive: If you have been drinking alcohol. Do not ride with someone who has been drinking. When you are tired or distracted. While texting. If you have been using any mind-altering substances or drugs. Wear a helmet and other protective equipment during sports activities. If you have firearms in your house, make sure you follow all gun safety procedures. Minimize exposure to UV radiation to reduce your risk of skin cancer. What's next? Visit your health care provider once a year for an annual wellness visit. Ask your health care provider how often you should have your eyes and teeth checked. Stay up to date on all vaccines. This information is not intended to replace advice given to you by your health care provider. Make sure you discuss any questions you have with your health care provider. Document Revised: 04/16/2021 Document Reviewed: 04/16/2021 Elsevier Patient Education  2024 [employer].

## 2024-06-02 LAB — COMPREHENSIVE METABOLIC PANEL WITH GFR
ALT: 47 U/L (ref 0–53)
AST: 24 U/L (ref 0–37)
Albumin: 3.4 g/dL — ABNORMAL LOW (ref 3.5–5.2)
Alkaline Phosphatase: 107 U/L (ref 39–117)
BUN: 20 mg/dL (ref 6–23)
CO2: 25 meq/L (ref 19–32)
Calcium: 8.7 mg/dL (ref 8.4–10.5)
Chloride: 102 meq/L (ref 96–112)
Creatinine, Ser: 0.65 mg/dL (ref 0.40–1.50)
GFR: 87.29 mL/min (ref >60.00)
Glucose, Bld: 169 mg/dL — ABNORMAL HIGH (ref 70–99)
Potassium: 4.2 meq/L (ref 3.5–5.1)
Sodium: 137 meq/L (ref 135–145)
Total Bilirubin: 0.4 mg/dL (ref 0.2–1.2)
Total Protein: 8 g/dL (ref 6.0–8.3)

## 2024-06-02 LAB — URINE CULTURE
MICRO NUMBER:: 16770618
SPECIMEN QUALITY:: ADEQUATE

## 2024-06-02 LAB — CBC WITH DIFFERENTIAL/PLATELET
Basophils Absolute: 0 K/uL (ref 0.0–0.1)
Basophils Relative: 0.4 % (ref 0.0–3.0)
Eosinophils Absolute: 0 K/uL (ref 0.0–0.7)
Eosinophils Relative: 0.2 % (ref 0.0–5.0)
HCT: 29.4 % — ABNORMAL LOW (ref 39.0–52.0)
Hemoglobin: 9.7 g/dL — ABNORMAL LOW (ref 13.0–17.0)
Lymphocytes Relative: 10.5 % — ABNORMAL LOW (ref 12.0–46.0)
Lymphs Abs: 1 K/uL (ref 0.7–4.0)
MCHC: 33 g/dL (ref 30.0–36.0)
MCV: 94.2 fl (ref 78.0–100.0)
Monocytes Absolute: 0.6 K/uL (ref 0.1–1.0)
Monocytes Relative: 6.4 % (ref 3.0–12.0)
Neutro Abs: 7.7 K/uL (ref 1.4–7.7)
Neutrophils Relative %: 82.5 % — ABNORMAL HIGH (ref 43.0–77.0)
Platelets: 296 K/uL (ref 150.0–400.0)
RBC: 3.12 Mil/uL — ABNORMAL LOW (ref 4.22–5.81)
RDW: 14.6 % (ref 11.5–15.5)
WBC: 9.3 K/uL (ref 4.0–10.5)

## 2024-06-02 LAB — PSA: PSA: 7.15 ng/mL — ABNORMAL HIGH (ref 0.10–4.00)

## 2024-06-02 LAB — LIPID PANEL
Cholesterol: 161 mg/dL (ref 0–200)
HDL: 32.5 mg/dL — ABNORMAL LOW (ref >39.00)
LDL Cholesterol: 104 mg/dL — ABNORMAL HIGH (ref 0–99)
NonHDL: 128.55
Total CHOL/HDL Ratio: 5
Triglycerides: 125 mg/dL (ref 0.0–149.0)
VLDL: 25 mg/dL (ref 0.0–40.0)

## 2024-06-02 LAB — TSH: TSH: 1.78 u[IU]/mL (ref 0.35–5.50)

## 2024-06-02 LAB — HEMOGLOBIN A1C: Hgb A1c MFr Bld: 6.7 % — ABNORMAL HIGH (ref 4.6–6.5)

## 2024-06-04 ENCOUNTER — Ambulatory Visit: Payer: Self-pay | Admitting: Family Medicine

## 2024-06-04 DIAGNOSIS — E611 Iron deficiency: Secondary | ICD-10-CM

## 2024-06-04 DIAGNOSIS — D539 Nutritional anemia, unspecified: Secondary | ICD-10-CM

## 2024-06-04 DIAGNOSIS — D509 Iron deficiency anemia, unspecified: Secondary | ICD-10-CM

## 2024-06-09 ENCOUNTER — Telehealth: Payer: Self-pay

## 2024-06-09 NOTE — Progress Notes (Signed)
 Complex Care Management Note Care Guide Note  06/09/2024 Name: Marvin Mcdonald MRN: [id number] DOB: 1941/07/07   Complex Care Management Outreach Attempts: An unsuccessful telephone outreach was attempted today to offer the patient information about available complex care management services.  Follow Up Plan:  Additional outreach attempts will be made to offer the patient complex care management information and services.   Encounter Outcome:  No Answer  Dreama [facility]  [facility], [facility] Health Care Management Assistant Direct Dial: [phone]  Fax: [phone]

## 2024-06-20 DIAGNOSIS — H903 Sensorineural hearing loss, bilateral: Secondary | ICD-10-CM | POA: Diagnosis not present

## 2024-06-21 ENCOUNTER — Ambulatory Visit

## 2024-06-21 ENCOUNTER — Encounter: Admitting: Student

## 2024-06-21 NOTE — Progress Notes (Unsigned)
 Complex Care Management Note Care Guide Note  06/21/2024 Name: Marvin Mcdonald MRN: [id number] DOB: 1941-08-26   Complex Care Management Outreach Attempts: A second unsuccessful outreach was attempted today to offer the patient with information about available complex care management services.  Follow Up Plan:  Additional outreach attempts will be made to offer the patient complex care management information and services.   Encounter Outcome:  No Answer  Dreama [facility]wood [facility]  [facility], [facility] Assistant Direct Dial: [phone]  Fax: [phone]

## 2024-06-23 NOTE — Progress Notes (Signed)
 Complex Care Management Note  Care Guide Note 06/23/2024 Name: Marvin Mcdonald MRN: [id number] DOB: [birth year]  Marvin Mcdonald is a 83 y.o. year old male who sees Domenica Harlene LABOR, MD for primary care. I reached out to [employer] by phone today to offer complex care management services.  [name] was given information about Complex Care Management services today including:   The Complex Care Management services include support from the care team which includes your Nurse Care Manager, Clinical Social Worker, or Pharmacist.  The Complex Care Management team is here to help remove barriers to the health concerns and goals most important to you. Complex Care Management services are voluntary, and the patient may decline or stop services at any time by request to their care team member.   Complex Care Management Consent Status: Patient agreed to services and verbal consent obtained.   Follow up plan:  Telephone appointment with complex care management team member scheduled for:  06/28/24 & 07/06/24.  Encounter Outcome:  Patient Scheduled  [facility]  [facility], [facility] VBCI Assistant Direct Dial: [phone]  Fax: [phone]

## 2024-06-28 ENCOUNTER — Other Ambulatory Visit: Payer: Self-pay | Admitting: *Deleted

## 2024-06-28 NOTE — Patient Outreach (Signed)
 Complex Care Management   Visit Note  07/24/2024 updated assessment for 06/28/24  Name:  Marvin Mcdonald MRN: [id number] DOB: 04/28/1941  Situation: Referral received for Complex Care Management related to Diabetes with Complications and Dementia I obtained verbal consent from Caregiver Daughter Un.  Visit completed with Caregiver  on the phone  Concerns voiced during outreach He eat other foods  breaks thing while daughter is working has  Daughter has home ring service  Baseline: Patient has been living alone for over a year. Nutrition: Daughter provides microwaveable meals; patient typically eats only once from each meal and allows the remainder to spoil. Dietitian referral would be considered  Social History: Daughter reports lifelong pattern of inactivity and frequent conflict with his brother. Not close with family; spouse is deceased.  Daughter voiced understanding of availability of [place] pal resource via united healthcare medicare coverage  Not eligible medicaid per daughter  Agreeable to social work (SW) referral        Background:   Past Medical History:  Diagnosis Date   Acute URI 01/03/2013   BPH (benign prostatic hypertrophy)    Colon cancer screening 04/01/2017   Colon polyps    Dental infection 10/07/2013   Diabetes mellitus    diet and exercise controlled   Hematuria 04/07/2013   Hyperlipidemia    Low back pain    Mass of neck 10/07/2013   Osteoporosis    Pain in joint, shoulder region 10/07/2013    Current Outpatient Medications:    atorvastatin  (LIPITOR) 40 MG tablet, Take 1 tablet (40 mg total) by mouth daily. *Need appointment for future refills.*, Disp: 30 tablet, Rfl: 0   Blood Glucose Monitoring Suppl (ONE TOUCH ULTRA 2) w/Device KIT, Use glucometer to test sugars once daily and as needed. Dx E11.9, Disp: 1 kit, Rfl: 0   donepezil  (ARICEPT ) 10 MG tablet, Take 1 tablet (10 mg total) by mouth daily., Disp: 90 tablet, Rfl: 3   glucose blood (ONETOUCH ULTRA)  test strip, Use to check sugar daily and as needed.  Dx Code: E11.9, Disp: 100 each, Rfl: 5   metFORMIN  (GLUCOPHAGE ) 500 MG tablet, Take 1 tablet (500 mg total) by mouth 2 (two) times daily with a meal., Disp: 180 tablet, Rfl: 1   OneTouch [employer]ica Lancets 33G MISC, Use as directed once daily and as needed.  Dx Code: E11.9, Disp: 100 each, Rfl: 5  Assessment: Patient Reported Symptoms:  Cognitive Cognitive Status: Able to follow simple commands, Confused or disoriented, Poor judgment in daily scenarios, Difficulties with attention and concentration, Struggling with memory recall Cognitive/Intellectual Conditions Management [RPT]: None reported or documented in medical history or problem list      Neurological Neurological Review of Symptoms: No symptoms reported Neurological Management Strategies: Routine screening Neurological Self-Management Outcome: 4 (good)  HEENT HEENT Symptoms Reported: Change or loss of hearing HEENT Management Strategies: Routine screening HEENT Self-Management Outcome: 3 (uncertain)    Cardiovascular Cardiovascular Symptoms Reported: No symptoms reported Cardiovascular Management Strategies: Routine screening Cardiovascular Self-Management Outcome: 4 (good)  Respiratory Respiratory Symptoms Reported: No symptoms reported Respiratory Management Strategies: Routine screening Respiratory Self-Management Outcome: 4 (good)  Endocrine Endocrine Symptoms Reported: Irritability, Unintentional weight loss, Weakness or fatigue Is patient diabetic?: Yes Is patient checking blood sugars at home?: No (infrequently) Endocrine Self-Management Outcome: 3 (uncertain)  Gastrointestinal Gastrointestinal Symptoms Reported: Change in appetite, Unintentional weight loss Gastrointestinal Self-Management Outcome: 3 (uncertain)    Genitourinary   Genitourinary Self-Management Outcome: 3 (uncertain)  Integumentary Integumentary Symptoms Reported: No symptoms  reported Skin Management  Strategies: Routine screening Skin Self-Management Outcome: 4 (good)  Musculoskeletal Musculoskelatal Symptoms Reviewed: Weakness Musculoskeletal Management Strategies: Routine screening Musculoskeletal Self-Management Outcome: 3 (uncertain) Falls in the past year?: No Number of falls in past year: 1 or less Was there an injury with Fall?: No Fall Risk Category Calculator: 0 Patient Fall Risk Level: Low Fall Risk Fall risk Follow up: Falls evaluation completed  Psychosocial Psychosocial Symptoms Reported: Alteration in eating habits, Irritability Behavioral Management Strategies: Support system, Medication therapy, Coping strategies Behavioral Health Self-Management Outcome: 3 (uncertain) Major Change/Loss/Stressor/Fears (CP): Medical condition, self, Relationship concerns, Resources Techniques to [place] with Loss/Stress/Change: Diversional activities, Withdraw Quality of Family Relationships: helpful, involved Do you feel physically threatened by others?: No    07/24/2024    PHQ2-9 Depression Screening   Little interest or pleasure in doing things Not at all  Feeling down, depressed, or hopeless Not at all  PHQ-2 - Total Score 0  Trouble falling or staying asleep, or sleeping too much    Feeling tired or having little energy    Poor appetite or overeating     Feeling bad about yourself - or that you are a failure or have let yourself or your family down    Trouble concentrating on things, such as reading the newspaper or watching television    Moving or speaking so slowly that other people could have noticed.  Or the opposite - being so fidgety or restless that you have been moving around a lot more than usual    Thoughts that you would be better off dead, or hurting yourself in some way    PHQ2-9 Total Score    If you checked off any problems, how difficult have these problems made it for you to do your work, take care of things at home, or get along with other people    Depression  Interventions/Treatment      There were no vitals filed for this visit.  Medications Reviewed Today   Medications were not reviewed in this encounter     Recommendation:   PCP Follow-up Referral to: Dietitian to review diet for any deficiency & education   Continue Current Plan of Care Referral to W, Continue outreach with VBCI/[facility] RN CM and SW   Follow Up Plan:   Telephone follow up appointment date/time:  07/24/24 0930   Karthik Whittinghill [name], RN, BSN, CCM [place]  [facility], [facility] RN Care Manager Direct Dial: [phone]  Fax: [phone]

## 2024-07-06 ENCOUNTER — Telehealth: Payer: Self-pay | Admitting: Licensed Clinical Social Worker

## 2024-07-19 ENCOUNTER — Other Ambulatory Visit

## 2024-07-21 ENCOUNTER — Other Ambulatory Visit: Payer: Self-pay | Admitting: Licensed Clinical Social Worker

## 2024-07-21 NOTE — Patient Instructions (Signed)
 Visit Information  Thank you for taking time to visit with me today. Please don't hesitate to contact me if I can be of assistance to you before our next scheduled appointment.  Our next appointment is by telephone on 07/31/24 at 930 Please call the care guide team at [phone] if you need to cancel or reschedule your appointment.   Following is a copy of your care plan:   Goals Addressed             This Visit's Progress    LCSW VBCI Social Work Care Plan       Problems:  Level of Care Concerns  Dementia Dx Care Coordination needs related to [place] healthcare, [place] veteran VA benefits, personal care services, food delivery services   Goal: Over the next 3 months the Caregiver/Patient will continue to work with Medical illustrator and/or Social Work team to address care management and care coordination needs related to Dementia, Depression, DMII, and [place] healthcare, [place] veteran VA benefits, personal care services, food delivery services as evidenced by adherence to care management team scheduled appointments     work with Child psychotherapist to address [employer] knowledge of community resource: Advertising copywriter, [place] veteran VA benefits, personal care services, food delivery services, Limited access to food, Limited social support, and Memory Deficits related to the management of Dementia, Depression, and DMII as evidenced by review of electronic medical record and patient or Child psychotherapist report      Interventions:   Interdisciplinary Collaboration Interventions:  Collaborated with VBCI team to initiate plan of care to address needs related to Limited access to caregiver, Memory Deficits, and Lacks knowledge of community resource: [place] healthcare, [place] veteran VA benefits, personal care services, food delivery services in patient with Dementia, Depression, and DMII Collaboration with Domenica Harlene LABOR, MD  Find [place] healthcare benefits, [place] veteran VA benefits, personal care  services, food delivery services  Provide support to daughter & patient  Provided contact numbers/websites for [employer] tri[employer] regional council [phone], [place] resources of [place] 336 [phone] Discussed the option to outreach to [employer] offices to obtain service connected [place] [employer] veterans Garment/textile technologist) benefits  Discussed LTC placement options (Memory care facility and Medicaid enrollment)  Patient Self-Care Activities:  Work with the Child psychotherapist to address care coordination needs and will continue to work with the clinical team to address health care and disease management related needs  Plan:  Telephone follow up appointment with care management team member scheduled within 30 days        Please call the Suicide and Crisis Lifeline: 988 call the [place]  National Suicide Prevention Lifeline: (519)548-7068 or TTY: 587-747-9667 TTY 903-281-1271) to talk to a trained counselor call 1-800-273-TALK (toll free, 24 hour hotline) go to [facility] [address], [place] [phone]) call 911 if you are experiencing a Mental Health or Behavioral Health Crisis or need someone to talk to.  Patient verbalizes understanding of instructions and care plan provided today and agrees to view in MyChart. Active MyChart status and patient understanding of how to access instructions and care plan via MyChart confirmed with patient.     Lyle Rung, BSW, MSW, LCSW Licensed Clinical Social Worker [employer] [facility]   [facility] [email or web address] Direct Dial: [phone]

## 2024-07-21 NOTE — Patient Outreach (Signed)
 Complex Care Management   Visit Note  07/21/2024  Name:  Marvin Mcdonald MRN: [id number] DOB: 1940-12-03  Situation: Referral received for Complex Care Management related to Social Work Needs-Dementia/Memory Concerns and ongoing Marvin strain. I obtained verbal consent from Marvin.  Visit completed with Marvin Mcdonald on the phone  Background:   Past Medical History:  Diagnosis Date   Acute URI 01/03/2013   BPH (benign prostatic hypertrophy)    Colon cancer screening 04/01/2017   Colon polyps    Dental infection 10/07/2013   Diabetes mellitus    diet and exercise controlled   Hematuria 04/07/2013   Hyperlipidemia    Low back pain    Mass of neck 10/07/2013   Osteoporosis    Pain in joint, shoulder region 10/07/2013    Assessment: Patient Reported Symptoms:  Cognitive Cognitive Status: Able to follow simple commands, Struggling with memory recall, Poor judgment in daily scenarios, Difficulties with attention and concentration Cognitive/Intellectual Conditions Management [RPT]: Other Other: Dementia Dx   Health Maintenance Behaviors: Annual physical exam, Immunizations Healing Pattern: Average Health Facilitated by: Rest, Stress management  Neurological Neurological Review of Symptoms: No symptoms reported Neurological Management Strategies: Routine screening Neurological Self-Management Outcome: 3 (uncertain) Neurological Comment: Dementia Dx  Psychosocial Psychosocial Symptoms Reported: Other Other Psychosocial Conditions: Marvin strain within the family Behavioral Management Strategies: Coping strategies, Support system Behavioral Health Self-Management Outcome: 3 (uncertain) Major Change/Loss/Stressor/Fears (CP): Resources Techniques to [employer] with Loss/Stress/Change: Withdraw Quality of Family Relationships: helpful, involved Do you feel physically threatened by others?: No    07/21/2024    PHQ2-9 Depression Screening   Little interest or pleasure in doing  things Not at all  Feeling down, depressed, or hopeless Not at all  PHQ-2 - Total Score 0   There were no vitals filed for this visit.  Medications Reviewed Today     Reviewed by Merlynn Lyle CROME, LCSW (Social Worker) on 07/21/24 at 1009  Med List Status: <None>   Medication Order Taking? Sig Documenting Provider Last Dose Status Informant  atorvastatin  (LIPITOR) 40 MG tablet [id number]  Take 1 tablet (40 mg total) by mouth daily. *Need appointment for future refills.DEWAINE Domenica Harlene DELENA, MD  Active   Blood Glucose Monitoring Suppl (ONE TOUCH ULTRA 2) w/Device KIT [id number]  Use glucometer to test sugars once daily and as needed. Dx E11.9 Domenica Harlene DELENA, MD  Active   donepezil  (ARICEPT ) 10 MG tablet [id number]  Take 1 tablet (10 mg total) by mouth daily. Wertman, Sara E, PA-C  Active   glucose blood (ONETOUCH ULTRA) test strip [id number]  Use to check sugar daily and as needed.  Dx Code: E11.9 Domenica Harlene DELENA, MD  Active   metFORMIN  (GLUCOPHAGE ) 500 MG tablet [id number]  Take 1 tablet (500 mg total) by mouth 2 (two) times daily with a meal. Domenica Harlene DELENA, MD  Active   [facility] Lancets 33G [place] [id number]  Use as directed once daily and as needed.  Dx Code: E11.9 Domenica Harlene DELENA, MD  Active             Recommendation:   PCP Follow-up Continue Current Plan of Care VBCI BSW and RNCM appointments on 07/24/24.  Follow Up Plan:   Telephone follow-up in 1 month  Lyle Merlynn, BSW, MSW, LCSW Licensed Clinical Social Worker [facility]   [facility] [email or web address] Direct Dial: [phone]

## 2024-07-24 ENCOUNTER — Other Ambulatory Visit: Payer: Self-pay

## 2024-07-24 ENCOUNTER — Other Ambulatory Visit: Payer: Self-pay | Admitting: *Deleted

## 2024-07-24 NOTE — Patient Instructions (Signed)
 Visit Information  Thank you for taking time to visit with me today. Please don't hesitate to contact me if I can be of assistance to you before our next scheduled appointment.  Our next appointment is by telephone on 07/24/24 at 0930 Please call the care guide team at [phone] if you need to cancel or reschedule your appointment.   Following is a copy of your care plan:   Goals Addressed             This Visit's Progress    Find community resources-VBCI RN Care Plan   No change    Problems:  Care Coordination needs related to [place] healthcare, [place] veteran VA benefits, personal care services, food delivery services   Goal: Over the next 3 months the Caregiver Patient will continue to work with Medical illustrator and/or Child psychotherapist to address care management and care coordination needs related to Dementia, Depression, DMII, and [place] healthcare, [place] veteran VA benefits, personal care services, food delivery services as evidenced by adherence to care management team scheduled appointments     work with Child psychotherapist to address [employer] knowledge of community resource: Advertising copywriter, [place] veteran VA benefits, personal care services, food delivery services, Limited access to food, Limited social support, and Memory Deficits related to the management of Dementia, Depression, and DMII as evidenced by review of electronic medical record and patient or Child psychotherapist report      Interventions:   Interdisciplinary Collaboration Interventions:  Collaborated with BSW to initiate plan of care to address needs related to Limited access to caregiver, Memory Deficits, and Lacks knowledge of community resource: [employer], [place] veteran VA benefits, personal care services, food delivery services in patient with Dementia, Depression, and DMII Collaboration with Domenica Harlene LABOR, MD  Find [place] healthcare benefits, [place] veteran VA benefits, personal care services, food delivery  services  Provide support to daughter, [place] & patient  Provided contact numbers/websites for [employer] tri[employer] regional council 336 [phone], [facility] [place] [phone] Discussed the option to outreach to [employer] offices to obtain service connected [place] war veterans (army) benefits   Patient Self-Care Activities:  Work with the Child psychotherapist to address care coordination needs and will continue to work with the clinical team to address health care and disease management related needs  Plan:  Telephone follow up appointment with care management team member scheduled for:  07/13/24 0930     hearing aides- VBCI RN Care Plan   No change    Problems:  Care Coordination needs related to finding reasonable & affordable hearing aides  Goal: Over the next 6 months the Caregiver will continue to work with Medical illustrator and/or Social Worker to address care management and care coordination needs related to finding reasonable & affordable hearing aides as evidenced by adherence to care management team scheduled appointments     & patient receiving hearing aides   Interventions:   Evaluation of current treatment plan related to hearing loss/ outreach to [employer] audiology services, Financial constraints related to cost of hearing aides offered by [facility] and procurement of affordable hearing aides self-management and patient's adherence to plan as established by provider. Discussed plans with patient for ongoing care management follow up and provided patient with direct contact information for care management team Provided patient and/or caregiver with verbal information about [employer] audiology services + possible in network audiology providers via [place] healthcare/Veterans benefit Nurse, learning disability)  Patient Self-Care Activities:  Attend all scheduled provider  appointments Work with the social worker to address care coordination needs and will continue to work with the clinical team to  address health care and disease management related needs  Plan:  Telephone follow up appointment with care management team member scheduled for:  07/13/24 0930      increase or maintain weight VBCI RN Care Plan   Worsening    Problems:  Language Barrier Memory deficit and non adherence to recommend low sodium, diabetic diet  Goal: Over the next 6 months the Caregiver Patient will continue to work with Medical illustrator and/or Social Worker to address care management and care coordination needs related to weight loss, decrease appetite as evidenced by adherence to care management team scheduled appointments     demonstrate Improved adherence to prescribed treatment plan for increase or maintenance of weight, intake of prescribed diet as evidenced by maintained or increase in weight value  Interventions:   Weight Loss Interventions: Reviewed options of high protein low sugar foods/snacks/drinks, Discussed importance of intake of fluids, high protein foods, discussed the wait list for [employer] on wheels program via the senior resources of [place] number [phone] find out where patient is on the wait list, offer other suggestions/resources for obtaining a better intake of foods/drinks/snacks, a list of food preferences and finding high protein options on this food list   Patient Self-Care Activities:  Increase intake of high protein foods/drinks/snacks, maintain or increase weight values, consider obtaining scales using the united healthcare over the counter U card benefit, receive portion size meals, get updated status of [place] meals on wheels, consider making a list of food preferences and finding high protein options on this food list  Plan:  Telephone follow up appointment with care management team member scheduled for:  07/13/24 0930             Please call the Suicide and Crisis Lifeline: 988 call the [place]  National Suicide Prevention Lifeline:  (206)049-8120 or TTY: 819-057-9416 TTY (732) 491-8996) to talk to a trained counselor call 1-800-273-TALK (toll free, 24 hour hotline) go to [facility] [address] [place] [phone]) call 911 if you are experiencing a Mental Health or Behavioral Health Crisis or need someone to talk to.  Patient verbalizes understanding of instructions and care plan provided today and agrees to view in MyChart. Active MyChart status and patient understanding of how to access instructions and care plan via MyChart confirmed with patient.     Briceson Broadwater [name], RN, BSN, CCM [place]  [facility], [facility] RN Care Manager Direct Dial: [phone]  Fax: [phone]

## 2024-07-24 NOTE — Patient Outreach (Signed)
 Complex Care Management   Visit Note  08/09/2024 updated note for 07/24/24  Name:  Atthew Coutant MRN: [id number] DOB: 01-05-1941  Situation: Referral received for Complex Care Management related to Diabetes with Complications, Dementia, and caregiver stress I obtained verbal consent from Caregiver Daughter Talitha.  Visit completed with Caregiver  on the phone  Daughter confirmed Mr Sargent was doing okay and she has spoken with the social worker (SW). She confirmed she was at work and the assessment and call were quick She agreed to follow up with a future RN CM    Background:   Past Medical History:  Diagnosis Date   Acute URI 01/03/2013   BPH (benign prostatic hypertrophy)    Colon cancer screening 04/01/2017   Colon polyps    Dental infection 10/07/2013   Diabetes mellitus    diet and exercise controlled   Hematuria 04/07/2013   Hyperlipidemia    Low back pain    Mass of neck 10/07/2013   Osteoporosis    Pain in joint, shoulder region 10/07/2013     Current Outpatient Medications:    atorvastatin  (LIPITOR) 40 MG tablet, Take 1 tablet (40 mg total) by mouth daily. *Need appointment for future refills.*, Disp: 30 tablet, Rfl: 0   Blood Glucose Monitoring Suppl (ONE TOUCH ULTRA 2) w/Device KIT, Use glucometer to test sugars once daily and as needed. Dx E11.9, Disp: 1 kit, Rfl: 0   donepezil  (ARICEPT ) 10 MG tablet, Take 1 tablet (10 mg total) by mouth daily., Disp: 90 tablet, Rfl: 3   Ferrous Fumarate -Folic Acid  324-1 MG TABS, Take 1 tablet by mouth daily., Disp: 30 tablet, Rfl: 3   glucose blood (ONETOUCH ULTRA) test strip, Use to check sugar daily and as needed.  Dx Code: E11.9, Disp: 100 each, Rfl: 5   metFORMIN  (GLUCOPHAGE ) 500 MG tablet, Take 1 tablet (500 mg total) by mouth 2 (two) times daily with a meal., Disp: 180 tablet, Rfl: 1   OneTouch Delica Lancets 33G MISC, Use as directed once daily and as needed.  Dx Code: E11.9, Disp: 100 each, Rfl: 5  Assessment: Patient Reported  Symptoms:  Cognitive Cognitive Status: Able to follow simple commands, Difficulties with attention and concentration, Poor judgment in daily scenarios, Requires Assistance Decision Making, Struggling with memory recall Cognitive/Intellectual Conditions Management [RPT]: Other Other: dementia   Health Maintenance Behaviors: Annual physical exam, Social activities Healing Pattern: Average Health Facilitated by: Rest, Stress management  Neurological Neurological Review of Symptoms: Weakness, Hearing changes Neurological Management Strategies: Routine screening Neurological Self-Management Outcome: 3 (uncertain)  HEENT HEENT Symptoms Reported: Change or loss of hearing HEENT Management Strategies: Medical device, Routine screening HEENT Self-Management Outcome: 3 (uncertain)    Cardiovascular Cardiovascular Symptoms Reported: No symptoms reported Does patient have uncontrolled Hypertension?: No (mointor for low BP ? dehydration) Cardiovascular Management Strategies: Routine screening Cardiovascular Self-Management Outcome: 4 (good)  Respiratory Respiratory Symptoms Reported: No symptoms reported Respiratory Management Strategies: Routine screening Respiratory Self-Management Outcome: 4 (good)  Endocrine Endocrine Symptoms Reported: Irritability, Unintentional weight loss, Weakness or fatigue Is patient diabetic?: Yes Is patient checking blood sugars at home?: No Endocrine Self-Management Outcome: 3 (uncertain)  Gastrointestinal Gastrointestinal Symptoms Reported: Change in appetite, Constipation, Unintentional weight loss Gastrointestinal Self-Management Outcome: 3 (uncertain)    Genitourinary Genitourinary Symptoms Reported: Incontinence Genitourinary Management Strategies: Incontinence garment/pad Genitourinary Self-Management Outcome: 3 (uncertain)  Integumentary Integumentary Symptoms Reported: No symptoms reported Skin Management Strategies: Routine screening Skin Self-Management  Outcome: 4 (good)  Musculoskeletal Musculoskelatal Symptoms Reviewed: Weakness Musculoskeletal Management  Strategies: Routine screening Musculoskeletal Self-Management Outcome: 3 (uncertain) Falls in the past year?: No Number of falls in past year: 1 or less Was there an injury with Fall?: No Fall Risk Category Calculator: 0 Patient Fall Risk Level: Low Fall Risk Fall risk Follow up: Falls evaluation completed  Psychosocial Psychosocial Symptoms Reported: Irritability, Other Other Psychosocial Conditions: family caregiver strain Behavioral Management Strategies: Coping strategies, Support system Behavioral Health Self-Management Outcome: 3 (uncertain) Major Change/Loss/Stressor/Fears (CP): Medical condition, self, Resources, Relationship concerns Techniques to Cope with Loss/Stress/Change: Diversional activities, Withdraw Quality of Family Relationships: helpful, involved Do you feel physically threatened by others?: No    08/09/2024    PHQ2-9 Depression Screening   Little interest or pleasure in doing things    Feeling down, depressed, or hopeless    PHQ-2 - Total Score    Trouble falling or staying asleep, or sleeping too much    Feeling tired or having little energy    Poor appetite or overeating     Feeling bad about yourself - or that you are a failure or have let yourself or your family down    Trouble concentrating on things, such as reading the newspaper or watching television    Moving or speaking so slowly that other people could have noticed.  Or the opposite - being so fidgety or restless that you have been moving around a lot more than usual    Thoughts that you would be better off dead, or hurting yourself in some way    PHQ2-9 Total Score    If you checked off any problems, how difficult have these problems made it for you to do your work, take care of things at home, or get along with other people    Depression Interventions/Treatment      There were no vitals  filed for this visit.  Medications Reviewed Today   Medications were not reviewed in this encounter     Recommendation:   PCP Follow-up Specialty provider follow-up social worker  Continue Current Plan of Care  Follow Up Plan:   Telephone follow up appointment date/time:  with Rosaline Arno Iha [name], RN, BSN, CCM [place]  [facility], [facility] RN Care Manager Direct Dial: [phone]  Fax: [phone]

## 2024-07-26 ENCOUNTER — Other Ambulatory Visit (INDEPENDENT_AMBULATORY_CARE_PROVIDER_SITE_OTHER)

## 2024-07-26 ENCOUNTER — Ambulatory Visit: Payer: Self-pay | Admitting: Family Medicine

## 2024-07-26 DIAGNOSIS — E611 Iron deficiency: Secondary | ICD-10-CM

## 2024-07-26 DIAGNOSIS — D539 Nutritional anemia, unspecified: Secondary | ICD-10-CM

## 2024-07-26 DIAGNOSIS — D509 Iron deficiency anemia, unspecified: Secondary | ICD-10-CM | POA: Diagnosis not present

## 2024-07-26 LAB — CBC WITH DIFFERENTIAL/PLATELET
Basophils Absolute: 0 K/uL (ref 0.0–0.1)
Basophils Relative: 0.4 % (ref 0.0–3.0)
Eosinophils Absolute: 0.1 K/uL (ref 0.0–0.7)
Eosinophils Relative: 1 % (ref 0.0–5.0)
HCT: 29.3 % — ABNORMAL LOW (ref 39.0–52.0)
Hemoglobin: 9.7 g/dL — ABNORMAL LOW (ref 13.0–17.0)
Lymphocytes Relative: 29.3 % (ref 12.0–46.0)
Lymphs Abs: 2.2 K/uL (ref 0.7–4.0)
MCHC: 33.2 g/dL (ref 30.0–36.0)
MCV: 94 fl (ref 78.0–100.0)
Monocytes Absolute: 0.5 K/uL (ref 0.1–1.0)
Monocytes Relative: 6 % (ref 3.0–12.0)
Neutro Abs: 4.8 K/uL (ref 1.4–7.7)
Neutrophils Relative %: 63.3 % (ref 43.0–77.0)
Platelets: 325 K/uL (ref 150.0–400.0)
RBC: 3.11 Mil/uL — ABNORMAL LOW (ref 4.22–5.81)
RDW: 14.2 % (ref 11.5–15.5)
WBC: 7.5 K/uL (ref 4.0–10.5)

## 2024-07-26 LAB — VITAMIN B12: Vitamin B-12: 494 pg/mL (ref 211–911)

## 2024-07-27 ENCOUNTER — Other Ambulatory Visit (HOSPITAL_BASED_OUTPATIENT_CLINIC_OR_DEPARTMENT_OTHER): Payer: Self-pay

## 2024-07-27 MED ORDER — FERROUS FUMARATE-FOLIC ACID 324-1 MG PO TABS
1.0000 | ORAL_TABLET | Freq: Every day | ORAL | 3 refills | Status: DC
  Filled 2024-07-27: qty 30, 30d supply, fill #0

## 2024-07-31 ENCOUNTER — Telehealth: Payer: Self-pay | Admitting: Licensed Clinical Social Worker

## 2024-08-01 ENCOUNTER — Other Ambulatory Visit: Payer: Self-pay | Admitting: Licensed Clinical Social Worker

## 2024-08-01 NOTE — Patient Instructions (Signed)
 Visit Information  Thank you for taking time to visit with me today. Please don't hesitate to contact me if I can be of assistance to you before our next scheduled appointment.  Our next appointment is by telephone on 08/21/24 at 315 pm Please call the care guide team at [phone] if you need to cancel or reschedule your appointment.   Following is a copy of your care plan:   Goals Addressed             This Visit's Progress    LCSW VBCI Social Work Care Plan       Problems:  Level of Care Concerns  Dementia Dx Care Coordination needs related to [place] healthcare, [place] veteran VA benefits, personal care services, food delivery services   Goal: Over the next 3 months the Caregiver/Patient will continue to work with Medical illustrator and/or Social Work team to address care management and care coordination needs related to Dementia, Depression, DMII, and [place] healthcare, [place] veteran VA benefits, personal care services, food delivery services as evidenced by adherence to care management team scheduled appointments     work with Child psychotherapist to address [employer] knowledge of community resource: Advertising copywriter, [place] veteran VA benefits, personal care services, food delivery services, Limited access to food, Limited social support, and Memory Deficits related to the management of Dementia, Depression, and DMII as evidenced by review of electronic medical record and patient or Child psychotherapist report      Interventions:   Interdisciplinary Collaboration Interventions:  Collaborated with VBCI team to initiate plan of care to address needs related to Limited access to caregiver, Memory Deficits, and Lacks knowledge of community resource: [place] healthcare, [place] veteran VA benefits, personal care services, food delivery services in patient with Dementia, Depression, and DMII Collaboration with Domenica Harlene LABOR, MD  Find [place] healthcare benefits, [place] veteran VA benefits, personal care  services, food delivery services  Provide support to daughter & patient  Provided contact numbers/websites for [employer] [facility] [phone], senior resources of [place] 336 [phone] Discussed the option to outreach to [employer] offices to obtain service connected [place] war veterans Garment/textile technologist) benefits  Discussed LTC placement options (Memory care facility and Medicaid enrollment), email sent to patient's daughter with a list of ALF's in the area that accept Special Assistance Medicaid  Patient Self-Care Activities:  Work with the Child psychotherapist to address care coordination needs and will continue to work with the clinical team to address health care and disease management related needs  Plan:  Telephone follow up appointment with care management team member scheduled within 30 days        Please call the Suicide and Crisis Lifeline: 988 call the [place]  National Suicide Prevention Lifeline: (787)248-6182 or TTY: 754-705-8767 TTY 609-657-5849) to talk to a trained counselor call 1-800-273-TALK (toll free, 24 hour hotline) go to [facility] Ur[facility] Care [address] [place] [phone]) call 911 if you are experiencing a Mental Health or Behavioral Health Crisis or need someone to talk to.  Patient verbalizes understanding of instructions and care plan provided today and agrees to view in MyChart. Active MyChart status and patient understanding of how to access instructions and care plan via MyChart confirmed with patient.     Lyle Rung, BSW, MSW, LCSW Licensed Clinical Social Worker [employer] [facility]   [facility] [email or web address] Direct Dial: [phone]

## 2024-08-01 NOTE — Patient Outreach (Signed)
 Complex Care Management   Visit Note  08/01/2024  Name:  Marvin Mcdonald MRN: [id number] DOB: [birth year]  Situation: Referral received for Complex Care Management related to Dementia I obtained verbal consent from Caregiver.  Visit completed with Caregiver  on the phone  Background:   Past Medical History:  Diagnosis Date   Acute URI 01/03/2013   BPH (benign prostatic hypertrophy)    Colon cancer screening 04/01/2017   Colon polyps    Dental infection 10/07/2013   Diabetes mellitus    diet and exercise controlled   Hematuria 04/07/2013   Hyperlipidemia    Low back pain    Mass of neck 10/07/2013   Osteoporosis    Pain in joint, shoulder region 10/07/2013    Assessment: Patient Reported Symptoms:  Cognitive Cognitive Status: Struggling with memory recall, Requires Assistance Decision Making, Able to follow simple commands Cognitive/Intellectual Conditions Management [RPT]: Other Other: Dementia   Health Maintenance Behaviors: Annual physical exam, Social activities Healing Pattern: Average Health Facilitated by: Rest, Stress management  Neurological Neurological Review of Symptoms: Hearing changes, Weakness Neurological Management Strategies: Routine screening Neurological Self-Management Outcome: 3 (uncertain)  Musculoskeletal Musculoskelatal Symptoms Reviewed: Weakness Musculoskeletal Management Strategies: Routine screening Musculoskeletal Self-Management Outcome: 3 (uncertain) Falls in the past year?: No Number of falls in past year: 1 or less Was there an injury with Fall?: No Fall Risk Category Calculator: 0 Patient Fall Risk Level: Low Fall Risk    Psychosocial Psychosocial Symptoms Reported: Other Other Psychosocial Conditions: caregiver strain with family Behavioral Management Strategies: Coping strategies, Adequate rest    Family deny BH resource needs at this time.    08/01/2024    PHQ2-9 Depression Screening   Little interest or pleasure in doing things Not  at all  Feeling down, depressed, or hopeless Not at all  PHQ-2 - Total Score 0  Trouble falling or staying asleep, or sleeping too much    Feeling tired or having little energy    Poor appetite or overeating     Feeling bad about yourself - or that you are a failure or have let yourself or your family down    Trouble concentrating on things, such as reading the newspaper or watching television    Moving or speaking so slowly that other people could have noticed.  Or the opposite - being so fidgety or restless that you have been moving around a lot more than usual    Thoughts that you would be better off dead, or hurting yourself in some way    PHQ2-9 Total Score    If you checked off any problems, how difficult have these problems made it for you to do your work, take care of things at home, or get along with other people    Depression Interventions/Treatment      There were no vitals filed for this visit.  Medications Reviewed Today     Reviewed by Merlynn Lyle CROME, LCSW (Social Worker) on 08/01/24 at 1518  Med List Status: <None>   Medication Order Taking? Sig Documenting Provider Last Dose Status Informant  atorvastatin  (LIPITOR) 40 MG tablet [id number]  Take 1 tablet (40 mg total) by mouth daily. *Need appointment for future refills.DEWAINE Domenica Harlene DELENA, MD  Active   Blood Glucose Monitoring Suppl (ONE TOUCH ULTRA 2) w/Device KIT [id number]  Use glucometer to test sugars once daily and as needed. Dx E11.9 Domenica Harlene DELENA, MD  Active   donepezil  (ARICEPT ) 10 MG tablet [id number]  Take 1 tablet (10  mg total) by mouth daily. Wertman, Sara E, PA-C  Active   Ferrous Fumarate -Folic Acid  324-1 MG TABS [id number]  Take 1 tablet by mouth daily. Domenica Harlene LABOR, MD  Active   glucose blood (ONETOUCH ULTRA) test strip [id number]  Use to check sugar daily and as needed.  Dx Code: E11.9 Domenica Harlene LABOR, MD  Active   metFORMIN  (GLUCOPHAGE ) 500 MG tablet [id number]  Take 1 tablet (500 mg total) by mouth 2 (two)  times daily with a meal. Domenica Harlene LABOR, MD  Active   [facility] Lancets 33G [place] [id number]  Use as directed once daily and as needed.  Dx Code: E11.9 Domenica Harlene LABOR, MD  Active             Recommendation:   PCP Follow-up Review email sent on 08/01/24 with LTC placement resources Start LTC placement process by looking at email information that I sent, find a facility of your choosing, set up PCP visit for discussion and look into applying for LTC Medicaid through DSS.   Follow Up Plan:   Telephone follow-up in 1 month  Lyle Rung, BSW, MSW, LCSW Licensed Clinical Social Worker [facility]   [email or web address] Direct Dial: [phone]

## 2024-08-03 ENCOUNTER — Other Ambulatory Visit: Payer: Self-pay

## 2024-08-03 NOTE — Patient Instructions (Signed)
 Visit Information  Thank you for taking time to visit with me today. Please don't hesitate to contact me if I can be of assistance to you before our next scheduled appointment.  Our next appointment is by telephone on 10/23 at 10am Please call the care guide team at [phone] if you need to cancel or reschedule your appointment.     Please call the Suicide and Crisis Lifeline: 988 call the [place]  National Suicide Prevention Lifeline: 941-166-8496 or TTY: (503)818-5331 TTY 409-137-1459) to talk to a trained counselor call 1-800-273-TALK (toll free, 24 hour hotline) go to [facility] [address] [place] [phone]) call 911 if you are experiencing a Mental Health or Behavioral Health Crisis or need someone to talk to.  Patient verbalizes understanding of instructions and care plan provided today and agrees to view in MyChart. Active MyChart status and patient understanding of how to access instructions and care plan via MyChart confirmed with patient.     Orlean Fey, BSW [place]  [facility] Social Worker, [employer] [facility] [phone]

## 2024-08-03 NOTE — Patient Outreach (Signed)
 Complex Care Management   Visit Note  08/03/2024  Name:  Marvin Mcdonald MRN: [id number] DOB: 1941-09-14  Situation: Referral received for Complex Care Management related to Caretaker resources, Assisted living, Meals on wheels. Medicaid enrollment I obtained verbal consent from Caregiver.  Visit completed with Caregiver  on the phone  Background:   Past Medical History:  Diagnosis Date   Acute URI 01/03/2013   BPH (benign prostatic hypertrophy)    Colon cancer screening 04/01/2017   Colon polyps    Dental infection 10/07/2013   Diabetes mellitus    diet and exercise controlled   Hematuria 04/07/2013   Hyperlipidemia    Low back pain    Mass of neck 10/07/2013   Osteoporosis    Pain in joint, shoulder region 10/07/2013    Assessment:  BSW outreached patient's daughter today to assess the patient for SDOH barriers. During the call, no current barriers were identified. However, the patient's daughter mentioned that the patient lives alone in a duplex, and it can become overwhelming for her to manage his needs, such as bringing food, preparing meals, and delivering groceries. Patient's daughter stated that her father is able to afford his rent, groceries, and utilities. She also mentioned that she is sometimes unable to take him to appointments but tries to schedule them at times she is available. BSW mentioned transportation services covered by the patient's insurance; however, the daughter stated she prefers to accompany her father to appointments because she feels the hospital interpreter is not always accurate. BSW will send the patient information on Medicaid enrollment, assisted living resources, Meals on Wheels, and personal care service programs. BSW will also provide information for the [employer] [place] [employer] for potential community and cultural support.     Recommendation:   No recommendations at this time  Follow Up Plan:   Telephone follow-up 10/23 at 10am    Orlean Fey, BSW [place]  [facility] Social Worker, [facility] [phone]

## 2024-08-09 NOTE — Patient Instructions (Addendum)
 Visit Information  Thank you for taking time to visit with me today. Please don't hesitate to contact me if I can be of assistance to you before our next scheduled appointment.  7 referrals to veteran programs for Marvin Mcdonald-Please check your outlook email & call or fill out the applications for the following programs-  1 Legal referral for disability by [employer],  2 Veterans' benefits assistance by relief foundation for veterans,  3 deaf & hard of hearing services by [employer],  4 phone based hearing test by the national hearing test,  5 national hearing aid projects by hearing charities of [place], 6 Hearing aid financial assistive by help [place] hear,  7 hearing aid by [place]  dept of veterans' affairs   Your next care management appointment is by telephone on 08/31/24 with Rosaline Finlay, RN CM  at 0900   Please call the care guide team at [phone] if you need to cancel, schedule, or reschedule an appointment.   Please call the Suicide and Crisis Lifeline: 988 call the [place]  National Suicide Prevention Lifeline: 6316814360 or TTY: (718) 204-4266 TTY (412)252-3002) to talk to a trained counselor call 1-800-273-TALK (toll free, 24 hour hotline) go to [facility] Urgent Care [address], [place] [phone]) call 911 if you are experiencing a Mental Health or Behavioral Health Crisis or need someone to talk to.  Marvin Christiana [name], RN, BSN, CCM [place]  [facility], [facility] RN Care Manager Direct Dial: [phone]  Fax: [phone]

## 2024-08-21 ENCOUNTER — Telehealth: Payer: Self-pay | Admitting: Licensed Clinical Social Worker

## 2024-08-21 ENCOUNTER — Encounter: Payer: Self-pay | Admitting: Licensed Clinical Social Worker

## 2024-08-21 NOTE — Patient Instructions (Signed)
 [employer] - I am sorry I was unable to reach you today for our scheduled appointment. I work with Domenica Harlene LABOR, MD and am calling to support your healthcare needs. Please contact me at [phone] at your earliest convenience. I look forward to speaking with you soon.   Thank you,  Lyle Rung, BSW, MSW, LCSW Licensed Clinical Social Worker [facility]   [email or web address] Direct Dial: [phone]

## 2024-08-24 ENCOUNTER — Other Ambulatory Visit: Payer: Self-pay

## 2024-08-24 NOTE — Patient Outreach (Signed)
 BSW outreached patients caretaker and they were not available to speak at this time. Caretaker asked to reschedule and appointment was moved to 10/29 at 1pm.  Orlean Fey, BSW [place]  [facility] Social Worker, [employer] [phone]

## 2024-08-24 NOTE — Patient Instructions (Signed)
 Visit Information  Thank you for taking time to visit with me today. Please don't hesitate to contact me if I can be of assistance to you before our next scheduled appointment.  Your next care management appointment is by telephone on 10/29 at 1pm  Telephone follow-up in 1 week  Please call the care guide team at [phone] if you need to cancel, schedule, or reschedule an appointment.   Please call the Suicide and Crisis Lifeline: 988 call the [place]  National Suicide Prevention Lifeline: 561-730-9852 or TTY: 514-249-2932 TTY 762-314-7746) to talk to a trained counselor call 1-800-273-TALK (toll free, 24 hour hotline) go to [facility] Ur[facility] Care [address], [place] [phone]) call 911 if you are experiencing a Mental Health or Behavioral Health Crisis or need someone to talk to.  Orlean Fey, BSW [place]  [facility] Social Worker, [facility] [phone]

## 2024-08-30 ENCOUNTER — Other Ambulatory Visit: Payer: Self-pay

## 2024-08-30 NOTE — Patient Outreach (Signed)
 Social Drivers of Health  Community Resource and Care Coordination Visit Note   08/30/2024  Name: Beacher Every MRN: [id number] DOB:[birth year]  Situation: Referral received for [facility] needs assessment and assistance related to Transportation Financial Strain  Meals on wheels. I obtained verbal consent from Caregiver.  Visit completed with caretaker on the phone.   Background:      Assessment:   BSW outreached patient's caretaker today to confirm if she had received the referrals. Patient's caretaker stated that she had recently taken a fall approximately three weeks ago and has not yet been able to review the resources. BSW resent the resource information to the caretaker for her review. During the call, caretaker also inquired about the status of Meals on Wheels services. BSW contacted Publishing Rights Manager to check on the status of the patient's placement but received no answer. BSW left a voicemail and advised the caretaker to also call and leave a voicemail regarding the Meals on Wheels application. BSW resent the resources and informed the caretaker to reach out if any further questions arise. BSW will close the case at this time.    Recommendation:   No recommendations at this time.  Follow Up Plan:   Patient has achieved all patient stated goals. [employer] will be closed. Patient has been provided contact information should new needs arise.   Orlean Fey, BSW [place]  [facility] Social Worker, [facility] [phone]

## 2024-08-30 NOTE — Patient Instructions (Signed)

## 2024-08-31 ENCOUNTER — Other Ambulatory Visit: Payer: Self-pay

## 2024-08-31 ENCOUNTER — Telehealth: Payer: Self-pay

## 2024-08-31 NOTE — Patient Instructions (Signed)
 Visit Information  Thank you for taking time to visit with me today. Please don't hesitate to contact me if I can be of assistance to you before our next scheduled appointment.  Your next care management appointment is by telephone on 10/05/24 at 11 AM  Please call the care guide team at [phone] if you need to cancel, schedule, or reschedule an appointment.   Please call the Suicide and Crisis Lifeline: 988 call 1-800-273-TALK (toll free, 24 hour hotline) if you are experiencing a Mental Health or Behavioral Health Crisis or need someone to talk to.  Rosaline Finlay, RN MSN [place]  VBCI Population Health RN Care Manager Direct Dial: [phone]  Fax: [phone]

## 2024-08-31 NOTE — Patient Outreach (Signed)
 Complex Care Management   Visit Note  08/31/2024  Name:  Marvin Mcdonald MRN: [id number] DOB: [birth year]  Situation: Referral received for Complex Care Management related to Diabetes with Complications, Dementia, and caregiver stress I obtained verbal consent from patient's daughter gilman olazabal.  Visit completed with [name]  on the phone  Background:   Past Medical History:  Diagnosis Date   Acute URI 01/03/2013   BPH (benign prostatic hypertrophy)    Colon cancer screening 04/01/2017   Colon polyps    Dental infection 10/07/2013   Diabetes mellitus    diet and exercise controlled   Hematuria 04/07/2013   Hyperlipidemia    Low back pain    Mass of neck 10/07/2013   Osteoporosis    Pain in joint, shoulder region 10/07/2013    Assessment: Patient Reported Symptoms:  Cognitive Cognitive Status: Struggling with memory recall, Requires Assistance Decision Making, Able to follow simple commands Cognitive/Intellectual Conditions Management [RPT]: Other Other: Dementia   Health Maintenance Behaviors: Annual physical exam, Social activities Health Facilitated by: Rest, Stress management  Neurological Neurological Review of Symptoms: Hearing changes, Other: [place] Neurological Symptoms/Conditions [RPT]: history of dementia Neurological Management Strategies: Routine screening, Medication therapy  HEENT HEENT Symptoms Reported: Change or loss of hearing HEENT Management Strategies: Medical device, Routine screening    Cardiovascular Cardiovascular Symptoms Reported: No symptoms reported Does patient have uncontrolled Hypertension?: No Cardiovascular Management Strategies: Medication therapy, Routine screening  Respiratory Respiratory Symptoms Reported: No symptoms reported    Endocrine Endocrine Symptoms Reported: Unintentional weight loss Is patient diabetic?: Yes Is patient checking blood sugars at home?: No    Gastrointestinal Gastrointestinal Symptoms Reported: Change in appetite,  Unintentional weight loss Additional Gastrointestinal Details: Patient's daughter reports her biggest concern is  his unintentional weight loss. Per chart review, patient's most recent weight 06/01/24 was 105 lb. She reports she or a friend of hers will bring him meals daily, but there are times when patient will not eat the food provided. She feels that he needs someone with him to remind him to eat. Gastrointestinal Comment: Patient's daughter reports she has noticed him coughing with swallowing less. She notes that he has previously worked with [facility]    Genitourinary Genitourinary Symptoms Reported: Incontinence Genitourinary Management Strategies: Incontinence garment/pad  Integumentary Integumentary Symptoms Reported: No symptoms reported    Musculoskeletal Musculoskelatal Symptoms Reviewed: No symptoms reported Additional Musculoskeletal Details: Patient's daughter reports he does not complain of pain Musculoskeletal Management Strategies: Routine screening, Adequate rest Musculoskeletal Comment: Patient's daughter denies falls that she is aware of Falls in the past year?: No Number of falls in past year: 1 or less Was there an injury with Fall?: No Fall Risk Category Calculator: 0 Patient Fall Risk Level: Low Fall Risk Patient at Risk for Falls Due to: No Fall Risks Fall risk Follow up: Falls evaluation completed, Education provided, Falls prevention discussed  Psychosocial Psychosocial Symptoms Reported: No symptoms reported, Other Other Psychosocial Conditions: Concern for caregiver strain (daughter)          08/31/2024    PHQ2-9 Depression Screening   Little interest or pleasure in doing things    Feeling down, depressed, or hopeless    PHQ-2 - Total Score    Trouble falling or staying asleep, or sleeping too much    Feeling tired or having little energy    Poor appetite or overeating     Feeling bad about yourself - or that you are a failure or have let yourself or your  family down    Trouble concentrating on things, such as reading the newspaper or watching television    Moving or speaking so slowly that other people could have noticed.  Or the opposite - being so fidgety or restless that you have been moving around a lot more than usual    Thoughts that you would be better off dead, or hurting yourself in some way    PHQ2-9 Total Score    If you checked off any problems, how difficult have these problems made it for you to do your work, take care of things at home, or get along with other people    Depression Interventions/Treatment      There were no vitals filed for this visit.  Medications Reviewed Today     Reviewed by Arno Rosaline SQUIBB, RN (Registered Nurse) on 08/31/24 at 1142  Med List Status: <None>   Medication Order Taking? Sig Documenting Provider Last Dose Status Informant  atorvastatin  (LIPITOR) 40 MG tablet [id number]  Take 1 tablet (40 mg total) by mouth daily. *Need appointment for future refills.DEWAINE Domenica Harlene DELENA, MD  Active   Blood Glucose Monitoring Suppl (ONE TOUCH ULTRA 2) w/Device KIT [id number]  Use glucometer to test sugars once daily and as needed. Dx E11.9 Domenica Harlene DELENA, MD  Active   donepezil  (ARICEPT ) 10 MG tablet [id number]  Take 1 tablet (10 mg total) by mouth daily. Wertman, Sara E, PA-C  Active   Ferrous Fumarate -Folic Acid  324-1 MG TABS [id number]  Take 1 tablet by mouth daily. Domenica Harlene DELENA, MD  Active   glucose blood (ONETOUCH ULTRA) test strip [id number]  Use to check sugar daily and as needed.  Dx Code: E11.9 Domenica Harlene DELENA, MD  Active   metFORMIN  (GLUCOPHAGE ) 500 MG tablet [id number]  Take 1 tablet (500 mg total) by mouth 2 (two) times daily with a meal. Domenica Harlene DELENA, MD  Active   [facility] Lancets 33G [place] [id number]  Use as directed once daily and as needed.  Dx Code: E11.9 Domenica Harlene DELENA, MD  Active             Recommendation:   Continue Current Plan of Care  Follow Up Plan:   Telephone follow  up appointment date/time:  10/05/24 at 11 AM  Rosaline Arno, RN MSN [place]  [facility] RN Care Manager Direct Dial: [phone]  Fax: [phone]

## 2024-09-06 ENCOUNTER — Encounter: Payer: Self-pay | Admitting: Licensed Clinical Social Worker

## 2024-09-06 ENCOUNTER — Other Ambulatory Visit: Payer: Self-pay | Admitting: Licensed Clinical Social Worker

## 2024-10-02 ENCOUNTER — Ambulatory Visit: Payer: Self-pay

## 2024-10-02 NOTE — Telephone Encounter (Signed)
 Appt scheduled

## 2024-10-02 NOTE — Telephone Encounter (Signed)
 FYI Only or Action Required?: Action required by provider: clinical question for provider and update on patient condition.  Patient was last seen in primary care on 06/01/2024 by Domenica Harlene LABOR, MD.  Called Nurse Triage reporting Dysphagia.  Symptoms began several weeks ago.  Interventions attempted: Rest, hydration, or home remedies.  Symptoms are: gradually worsening.  Triage Disposition: See Physician Within 24 Hours  Patient/caregiver understands and will follow disposition?: Yes Reason for Disposition  [1] Swallowing difficulty AND [2] cause unknown  (Exception: Difficulty swallowing is a chronic symptom.)  Answer Assessment - Initial Assessment Questions Spoke with Daughter for triage.   1. DESCRIPTION: Tell me more about this problem. Are you  having trouble swallowing liquids, solids, or both? Any trouble with swallowing saliva (spit)?     Liquids and Solids  2. SEVERITY: How bad is the swallowing difficulty?  (Scale 1-10; or mild, moderate, severe)     Patient states his appetite has changed and he doesn't want to eat. The patient does a lot of coughing.   3. ONSET: When did the swallowing problems begin?      2-3 Weeks  4. CAUSE: What do you think is causing the problem?  (e.g., dry mouth, food or pill stuck in throat, mouth pain, sore throat, progression of disease process such as dementia or Parkinson's disease).      Unsure  5. CHRONIC or RECURRENT: Is this a new problem for you?  If No, ask: How long have you had this problem? (e.g., days, weeks, months)      No  6. OTHER SYMPTOMS: Do you have any other symptoms? (e.g., chest pain, difficulty breathing, mouth sores, sore throat, swollen tongue, chest pain)     Coughing, Weakness, Weight Loss (10 lbs)  Protocols used: Swallowing Difficulty-A-AH

## 2024-10-03 ENCOUNTER — Ambulatory Visit (HOSPITAL_BASED_OUTPATIENT_CLINIC_OR_DEPARTMENT_OTHER)
Admission: RE | Admit: 2024-10-03 | Discharge: 2024-10-03 | Disposition: A | Source: Ambulatory Visit | Attending: Family Medicine | Admitting: Family Medicine

## 2024-10-03 ENCOUNTER — Encounter: Payer: Self-pay | Admitting: Family Medicine

## 2024-10-03 ENCOUNTER — Other Ambulatory Visit: Payer: Self-pay | Admitting: Family Medicine

## 2024-10-03 ENCOUNTER — Ambulatory Visit: Admitting: Family Medicine

## 2024-10-03 ENCOUNTER — Ambulatory Visit: Payer: Self-pay | Admitting: Family Medicine

## 2024-10-03 VITALS — BP 106/60 | HR 115 | Temp 97.1°F | Resp 16 | Ht 68.0 in | Wt 88.2 lb

## 2024-10-03 DIAGNOSIS — D509 Iron deficiency anemia, unspecified: Secondary | ICD-10-CM | POA: Diagnosis not present

## 2024-10-03 DIAGNOSIS — R918 Other nonspecific abnormal finding of lung field: Secondary | ICD-10-CM

## 2024-10-03 DIAGNOSIS — E782 Mixed hyperlipidemia: Secondary | ICD-10-CM

## 2024-10-03 DIAGNOSIS — R4182 Altered mental status, unspecified: Secondary | ICD-10-CM

## 2024-10-03 DIAGNOSIS — E118 Type 2 diabetes mellitus with unspecified complications: Secondary | ICD-10-CM

## 2024-10-03 DIAGNOSIS — R053 Chronic cough: Secondary | ICD-10-CM

## 2024-10-03 DIAGNOSIS — E440 Moderate protein-calorie malnutrition: Secondary | ICD-10-CM | POA: Insufficient documentation

## 2024-10-03 DIAGNOSIS — I10 Essential (primary) hypertension: Secondary | ICD-10-CM | POA: Diagnosis not present

## 2024-10-03 NOTE — Patient Outreach (Signed)
 Call placed to [facility] hosp[facility] regarding referral placed by PCP 10/03/24. Referral has not yet been received. Per representative, once referral is received they are typically able to make initial visit within 1-2 days depending on patient availability.   CCM follow-up is scheduled for 10/05/24. Will confirm that patient's daughter has heard from hospice agency to schedule initial evaluation during follow-up visit.  Rosaline Finlay, RN MSN [place]  [facility] RN Care Manager Direct Dial: [phone]  Fax: [phone]

## 2024-10-03 NOTE — Progress Notes (Signed)
 Subjective:    Patient ID: Marvin Mcdonald, male    DOB: [birth year], 83 y.o.   MRN: [id number]  Chief Complaint  Patient presents with   Trouble swallowing    Pt's daughter states pt is having trouble with swallowing and states having a cough. Pt does have dementia and has trouble discussing sxs    HPI Patient is in today for weakness,  wt loss and difficulty swallowing.  Discussed the use of AI scribe software for clinical note transcription with the patient, who gave verbal consent to proceed.  History of Present Illness Marvin Mcdonald is an 83 year old male with dementia who presents with worsening weakness and difficulty swallowing.  Over the past three to four weeks, he has experienced a significant decline in his physical condition, characterized by increasing weakness and difficulty swallowing. He has lost weight recently and has a reduced appetite, requiring encouragement to eat. Frequent coughing occurs, especially when eating or drinking.  He lives alone in an apartment but requires assistance to get out of bed and walk. He is reluctant to move and prefers to stay in bed. In the past few days, he has been unable to walk independently and did not walk into the clinic today. He can stand and walk a little with encouragement but is increasingly resistant to doing so.  He has a history of dementia and has not had a stroke. No pain is reported, and he does not understand English well. He has been coughing up white mucus and has difficulty controlling his bladder, requiring the use of incontinence products.  His nutritional intake is limited to booster drinks, applesauce with medication, and some soup, as he struggles with solid foods and often coughs when drinking liquids. His caregiver is concerned about his rapid decline and the need for more comprehensive care.  He has a lung mass that was previously evaluated by a lung specialist, but he declined invasive diagnostic procedures due  to concerns about the risks involved. He has been seen by a neurologist and has an upcoming appointment next week.  ]  Past Medical History:  Diagnosis Date   Acute URI 01/03/2013   BPH (benign prostatic hypertrophy)    Colon cancer screening 04/01/2017   Colon polyps    Dental infection 10/07/2013   Diabetes mellitus    diet and exercise controlled   Hematuria 04/07/2013   Hyperlipidemia    Low back pain    Mass of neck 10/07/2013   Osteoporosis    Pain in joint, shoulder region 10/07/2013    Past Surgical History:  Procedure Laterality Date   COLONOSCOPY     HAND SURGERY     POLYPECTOMY      Family History  Problem Relation Age of Onset   Hypertension Sister    Hypertension Brother    Depression Paternal Grandmother    Hypertension Brother    Hypertension Brother    Colon cancer Neg Hx    Stomach cancer Neg Hx    Esophageal cancer Neg Hx     Social History   Socioeconomic History   Marital status: Widowed    Spouse name: Young Onorato   Number of children: 2   Years of education: 12   Highest education level: 12th grade  Occupational History    Employer: [employer]   Tobacco Use   Smoking status: Former    Current packs/day: 0.00    Average packs/day: 0.5 packs/day for 25.0 years (12.5 ttl pk-yrs)  Types: Cigarettes    Start date: 07/28/1971    Quit date: 07/27/1996    Years since quitting: 28.2    Passive exposure: Past   Smokeless tobacco: Never   Tobacco comments:    Verified by Daughter, Eun Dacanay  Vaping Use   Vaping status: Never Used  Substance and Sexual Activity   Alcohol use: No    Alcohol/week: 0.0 standard drinks of alcohol   Drug use: No   Sexual activity: Not Currently  Other Topics Concern   Not on file  Social History Narrative   Supportive daughter   Right handed   Drinks caffeine   One story home   Social Drivers of Health   Financial Resource Strain: Low Risk  (08/03/2024)   Overall Financial Resource Strain (CARDIA)     Difficulty of Paying Living Expenses: Not very hard  Food Insecurity: No Food Insecurity (08/03/2024)   Hunger Vital Sign    Worried About Running Out of Food in the Last Year: Never true    Ran Out of Food in the Last Year: Never true  Recent Concern: Food Insecurity - Food Insecurity Present (08/01/2024)   Hunger Vital Sign    Worried About Running Out of Food in the Last Year: Sometimes true    Ran Out of Food in the Last Year: Sometimes true  Transportation Needs: No Transportation Needs (08/03/2024)   PRAPARE - Administrator, Civil Service (Medical): No    Lack of Transportation (Non-Medical): No  Physical Activity: Insufficiently Active (04/28/2023)   Exercise Vital Sign    Days of Exercise per Week: 2 days    Minutes of Exercise per Session: 20 min  Stress: Stress Concern Present (08/01/2024)   [employer] of Occupational Health - Occupational Stress Questionnaire    Feeling of Stress: To some extent  Social Connections: Moderately Integrated (04/28/2023)   Social Connection and Isolation Panel    Frequency of Communication with Friends and Family: More than three times a week    Frequency of Social Gatherings with Friends and Family: Once a week    Attends Religious Services: More than 4 times per year    Active Member of [employer] or Organizations: Yes    Attends Banker Meetings: More than 4 times per year    Marital Status: Widowed  Intimate Partner Violence: Not At Risk (03/25/2022)   Humiliation, Afraid, Rape, and Kick questionnaire    Fear of Current or Ex-Partner: No    Emotionally Abused: No    Physically Abused: No    Sexually Abused: No    Outpatient Medications Prior to Visit  Medication Sig Dispense Refill   atorvastatin  (LIPITOR) 40 MG tablet Take 1 tablet (40 mg total) by mouth daily. *Need appointment for future refills.* 30 tablet 0   Blood Glucose Monitoring Suppl (ONE TOUCH ULTRA 2) w/Device KIT Use glucometer to test sugars once  daily and as needed. Dx E11.9 1 kit 0   donepezil  (ARICEPT ) 10 MG tablet Take 1 tablet (10 mg total) by mouth daily. 90 tablet 3   Ferrous Fumarate -Folic Acid  324-1 MG TABS Take 1 tablet by mouth daily. 30 tablet 3   glucose blood (ONETOUCH ULTRA) test strip Use to check sugar daily and as needed.  Dx Code: E11.9 100 each 5   metFORMIN  (GLUCOPHAGE ) 500 MG tablet Take 1 tablet (500 mg total) by mouth 2 (two) times daily with a meal. 180 tablet 1   OneTouch [employer]ica Lancets 33G MISC Use  as directed once daily and as needed.  Dx Code: E11.9 100 each 5   No facility-administered medications prior to visit.    No Known Allergies  Review of Systems  Constitutional:  Positive for malaise/fatigue and weight loss. Negative for fever.  HENT:  Negative for congestion.   Eyes:  Negative for blurred vision.  Respiratory:  Positive for cough and sputum production. Negative for shortness of breath and wheezing.   Cardiovascular:  Negative for chest pain, palpitations and leg swelling.  Gastrointestinal:  Negative for abdominal pain, blood in stool and nausea.  Genitourinary:  Negative for dysuria and frequency.  Musculoskeletal:  Negative for falls.  Skin:  Negative for rash.  Neurological:  Positive for weakness. Negative for dizziness, loss of consciousness and headaches.  Endo/Heme/Allergies:  Negative for environmental allergies.  Psychiatric/Behavioral:  Negative for depression. The patient is not nervous/anxious.        Objective:    Physical Exam Vitals and nursing note reviewed.  Constitutional:      General: He is not in acute distress.    Appearance: Normal appearance. He is well-developed. He is cachectic. He is ill-appearing.  HENT:     Head: Normocephalic and atraumatic.  Eyes:     General: No scleral icterus.       Right eye: No discharge.        Left eye: No discharge.  Cardiovascular:     Rate and Rhythm: Normal rate and regular rhythm.     Heart sounds: No murmur  heard. Pulmonary:     Effort: Pulmonary effort is normal. No respiratory distress.     Breath sounds: Normal breath sounds.  Musculoskeletal:     Cervical back: Normal range of motion and neck supple.     Right lower leg: No edema.     Left lower leg: No edema.  Skin:    General: Skin is warm and dry.  Neurological:     Mental Status: He is lethargic and confused.     Motor: Weakness and atrophy present.     Comments: Pt now unable to walk without help ---in wheelchair or bed majority of the time   Psychiatric:        Mood and Affect: Mood normal.        Behavior: Behavior normal.        Thought Content: Thought content normal.        Judgment: Judgment normal.     BP 106/60 (BP Location: Left Arm, Patient Position: Sitting, Cuff Size: Small)   Pulse (!) 115   Temp (!) 97.1 F (36.2 C) (Oral)   Resp 16   Ht 5' 8 (1.727 m)   Wt 88 lb 3.2 oz (40 kg)   SpO2 96%   BMI 13.41 kg/m  Wt Readings from Last 3 Encounters:  10/03/24 88 lb 3.2 oz (40 kg)  06/01/24 105 lb (47.6 kg)  03/30/24 114 lb (51.7 kg)    Diabetic Foot Exam - Simple   No data filed    Lab Results  Component Value Date   WBC 7.5 07/26/2024   HGB 9.7 (L) 07/26/2024   HCT 29.3 (L) 07/26/2024   PLT 325.0 07/26/2024   GLUCOSE 169 (H) 06/01/2024   CHOL 161 06/01/2024   TRIG 125.0 06/01/2024   HDL 32.50 (L) 06/01/2024   LDLDIRECT 145.0 03/13/2022   LDLCALC 104 (H) 06/01/2024   ALT 47 06/01/2024   AST 24 06/01/2024   NA 137 06/01/2024   K 4.2 06/01/2024  CL 102 06/01/2024   CREATININE 0.65 06/01/2024   BUN 20 06/01/2024   CO2 25 06/01/2024   TSH 1.78 06/01/2024   PSA 7.15 (H) 06/01/2024   HGBA1C 6.7 (H) 06/01/2024   MICROALBUR 2.1 (H) 06/01/2024    Lab Results  Component Value Date   TSH 1.78 06/01/2024   Lab Results  Component Value Date   WBC 7.5 07/26/2024   HGB 9.7 (L) 07/26/2024   HCT 29.3 (L) 07/26/2024   MCV 94.0 07/26/2024   PLT 325.0 07/26/2024   Lab Results  Component Value  Date   NA 137 06/01/2024   K 4.2 06/01/2024   CO2 25 06/01/2024   GLUCOSE 169 (H) 06/01/2024   BUN 20 06/01/2024   CREATININE 0.65 06/01/2024   BILITOT 0.4 06/01/2024   ALKPHOS 107 06/01/2024   AST 24 06/01/2024   ALT 47 06/01/2024   PROT 8.0 06/01/2024   ALBUMIN 3.4 (L) 06/01/2024   CALCIUM  8.7 06/01/2024   GFR 87.29 06/01/2024   Lab Results  Component Value Date   CHOL 161 06/01/2024   Lab Results  Component Value Date   HDL 32.50 (L) 06/01/2024   Lab Results  Component Value Date   LDLCALC 104 (H) 06/01/2024   Lab Results  Component Value Date   TRIG 125.0 06/01/2024   Lab Results  Component Value Date   CHOLHDL 5 06/01/2024   Lab Results  Component Value Date   HGBA1C 6.7 (H) 06/01/2024       Assessment & Plan:  Altered mental status, unspecified altered mental status type -     CT HEAD WO CONTRAST ( ); Future -     RPR W/RFLX TO RPR TITER, TREPONEMAL AB, SCREEN AND DIAGNOSIS -     Ambulatory referral to Hospice  Iron deficiency anemia, unspecified iron deficiency anemia type -     Comprehensive metabolic panel with GFR -     Lipid panel -     TSH -     DG Chest 2 View; Future  Controlled type 2 diabetes mellitus with complication, without long-term current use of insulin (HCC) -     Hemoglobin A1c  Essential hypertension -     CBC with Differential/Platelet -     Comprehensive metabolic panel with GFR  Mixed hyperlipidemia  Moderate protein-calorie malnutrition -     Ambulatory referral to Hospice  Chronic cough  Lung mass -     Ambulatory referral to Hospice  Assessment and Plan Assessment & Plan Progressive functional decline and protein-calorie malnutrition   He has experienced significant functional decline over the past three to four weeks, with increased weakness, difficulty standing and walking, and reluctance to move. Weight loss and difficulty swallowing have led to inadequate nutritional intake. He currently consumes only  Boost, applesauce, and soup, with frequent coughing and choking on liquids and solids. He requires assistance for mobility and daily activities. Blood work has been ordered to assess nutritional status and identify any underlying issues. A chest x-ray and CT scan of the head have been arranged to rule out acute changes. Coordination with a child psychotherapist is underway to explore nursing home placement options. The potential need for 24-hour care has been discussed, considering palliative care or hospice services. A thickener for liquids has been arranged to prevent aspiration.  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 He prefers to avoid invasive diagnostic procedures due to age and potential  risks. Symptoms may be related to the lung mass, but no recent invasive testing has been pursued. A chest x-ray has been ordered to assess the current status of the lung mass. Referral to hospice for potential in-home care and support has been made.  Dementia   He has short-term memory issues but retains long-term memory. His mental status fluctuates, with some awareness of family members. There is no recent stroke history. A CT scan of the head has been ordered to assess for any acute changes. Coordination with a child psychotherapist is in place to ensure appropriate care and support.  Urinary incontinence   He has experienced recent onset of urinary incontinence over the past three to four weeks, requiring use of Depends.    Meika Earll R Lowne Chase, DO

## 2024-10-04 ENCOUNTER — Telehealth: Payer: Self-pay | Admitting: *Deleted

## 2024-10-04 LAB — CBC WITH DIFFERENTIAL/PLATELET
Basophils Absolute: 0.1 K/uL (ref 0.0–0.1)
Basophils Relative: 0.9 % (ref 0.0–3.0)
Eosinophils Absolute: 0.1 K/uL (ref 0.0–0.7)
Eosinophils Relative: 0.8 % (ref 0.0–5.0)
HCT: 32.7 % — ABNORMAL LOW (ref 39.0–52.0)
Hemoglobin: 10.6 g/dL — ABNORMAL LOW (ref 13.0–17.0)
Lymphocytes Relative: 11.5 % — ABNORMAL LOW (ref 12.0–46.0)
Lymphs Abs: 1.2 K/uL (ref 0.7–4.0)
MCHC: 32.3 g/dL (ref 30.0–36.0)
MCV: 96.3 fl (ref 78.0–100.0)
Monocytes Absolute: 0.7 K/uL (ref 0.1–1.0)
Monocytes Relative: 6.6 % (ref 3.0–12.0)
Neutro Abs: 8 K/uL — ABNORMAL HIGH (ref 1.4–7.7)
Neutrophils Relative %: 80.2 % — ABNORMAL HIGH (ref 43.0–77.0)
Platelets: 244 K/uL (ref 150.0–400.0)
RBC: 3.4 Mil/uL — ABNORMAL LOW (ref 4.22–5.81)
RDW: 14.9 % (ref 11.5–15.5)
WBC: 10 K/uL (ref 4.0–10.5)

## 2024-10-04 LAB — LIPID PANEL
Cholesterol: 141 mg/dL (ref 0–200)
HDL: 36.4 mg/dL — ABNORMAL LOW (ref >39.00)
LDL Cholesterol: 85 mg/dL (ref 0–99)
NonHDL: 104.44
Total CHOL/HDL Ratio: 4
Triglycerides: 98 mg/dL (ref 0.0–149.0)
VLDL: 19.6 mg/dL (ref 0.0–40.0)

## 2024-10-04 LAB — COMPREHENSIVE METABOLIC PANEL WITH GFR
ALT: 21 U/L (ref 0–53)
AST: 27 U/L (ref 0–37)
Albumin: 3.1 g/dL — ABNORMAL LOW (ref 3.5–5.2)
Alkaline Phosphatase: 168 U/L — ABNORMAL HIGH (ref 39–117)
BUN: 22 mg/dL (ref 6–23)
CO2: 29 meq/L (ref 19–32)
Calcium: 9.3 mg/dL (ref 8.4–10.5)
Chloride: 102 meq/L (ref 96–112)
Creatinine, Ser: 0.67 mg/dL (ref 0.40–1.50)
GFR: 86.29 mL/min (ref >60.00)
Glucose, Bld: 163 mg/dL — ABNORMAL HIGH (ref 70–99)
Potassium: 3.2 meq/L — ABNORMAL LOW (ref 3.5–5.1)
Sodium: 143 meq/L (ref 135–145)
Total Bilirubin: 0.5 mg/dL (ref 0.2–1.2)
Total Protein: 7.4 g/dL (ref 6.0–8.3)

## 2024-10-04 LAB — HEMOGLOBIN A1C: Hgb A1c MFr Bld: 5.6 % (ref 4.6–6.5)

## 2024-10-04 LAB — TSH: TSH: 3.02 u[IU]/mL (ref 0.35–5.50)

## 2024-10-04 NOTE — Telephone Encounter (Unsigned)
 Copied from CRM [id number]. Topic: Clinical - Medical Advice >> Oct 04, 2024 12:13 PM Montie POUR wrote: Reason for CRM:  Joen is with [facility] and wants to know if Dr. Domenica will be the attending doctor and if Mr. Martelli has 6 or less to live. Please call her at [phone]

## 2024-10-05 ENCOUNTER — Telehealth

## 2024-10-05 ENCOUNTER — Other Ambulatory Visit

## 2024-10-05 LAB — SYPHILIS: RPR W/REFLEX TO RPR TITER AND TREPONEMAL ANTIBODIES, TRADITIONAL SCREENING AND DIAGNOSIS ALGORITHM: RPR Ser Ql: NONREACTIVE

## 2024-10-05 NOTE — Patient Outreach (Signed)
 Complex Care Management   Visit Note  10/05/2024  Name:  Marvin Mcdonald MRN: [id number] DOB: [birth year]  Situation: Referral received for Complex Care Management related to Dementia and family reported decline in health I obtained verbal consent from patient's daughter Marvin Mcdonald.  Visit completed with patient's daughter  on the phone  Background:   Past Medical History:  Diagnosis Date   Acute URI 01/03/2013   BPH (benign prostatic hypertrophy)    Colon cancer screening 04/01/2017   Colon polyps    Dental infection 10/07/2013   Diabetes mellitus    diet and exercise controlled   Hematuria 04/07/2013   Hyperlipidemia    Low back pain    Mass of neck 10/07/2013   Osteoporosis    Pain in joint, shoulder region 10/07/2013    Assessment: Patient Reported Symptoms:  Cognitive Cognitive Status: Struggling with memory recall, Requires Assistance Decision Making, Able to follow simple commands Cognitive/Intellectual Conditions Management [RPT]: Other Other: Dementia   Health Maintenance Behaviors: Annual physical exam, Social activities Health Facilitated by: Rest, Stress management  Neurological Neurological Review of Symptoms: Not assessed    HEENT HEENT Symptoms Reported: Not assessed      Cardiovascular Cardiovascular Symptoms Reported: Not assessed    Respiratory Respiratory Symptoms Reported: Dry cough Additional Respiratory Details: Dry cough like he is trying to clear his throat Respiratory Management Strategies: Routine screening  Endocrine Endocrine Symptoms Reported: Not assessed    Gastrointestinal Gastrointestinal Symptoms Reported: Change in appetite, Incontinence, Unintentional weight gain, Other Other Gastrointestinal Symptoms: Difficulty swallowing liquids, foods, and medications. Patient's daughter reports she has been giving him soft foods and medications in applesauce Additional Gastrointestinal Details: Patient's daughter reports recent incontinence of bowels. She  has been managing his care Gastrointestinal Management Strategies: Incontinence garment/pad, Coping strategies    Genitourinary Genitourinary Symptoms Reported: Incontinence Genitourinary Management Strategies: Incontinence garment/pad  Integumentary Integumentary Symptoms Reported: Not assessed    Musculoskeletal Musculoskelatal Symptoms Reviewed: Difficulty walking, Limited mobility, Weakness, Other Other Musculoskeletal Symptoms: Patient's daughter reports patient is now spending more time in bed and is having increased difficulty standing on his own Musculoskeletal Management Strategies: Adequate rest, Routine screening Musculoskeletal Comment: Patient's daughter denies falls that she is aware of Falls in the past year?: No Number of falls in past year: 1 or less Was there an injury with Fall?: No Fall Risk Category Calculator: 0 Patient Fall Risk Level: Low Fall Risk Patient at Risk for Falls Due to: Impaired balance/gait, Impaired mobility, Mental status change Fall risk Follow up: Falls evaluation completed, Education provided, Falls prevention discussed  Psychosocial Psychosocial Symptoms Reported: Not assessed          10/05/2024    PHQ2-9 Depression Screening   Little interest or pleasure in doing things    Feeling down, depressed, or hopeless    PHQ-2 - Total Score    Trouble falling or staying asleep, or sleeping too much    Feeling tired or having little energy    Poor appetite or overeating     Feeling bad about yourself - or that you are a failure or have let yourself or your family down    Trouble concentrating on things, such as reading the newspaper or watching television    Moving or speaking so slowly that other people could have noticed.  Or the opposite - being so fidgety or restless that you have been moving around a lot more than usual    Thoughts that you would be better off dead,  or hurting yourself in some way    PHQ2-9 Total Score    If you checked off  any problems, how difficult have these problems made it for you to do your work, take care of things at home, or get along with other people    Depression Interventions/Treatment      There were no vitals filed for this visit.    Medications Reviewed Today     Reviewed by Arno Rosaline SQUIBB, RN (Registered Nurse) on 10/05/24 at 1132  Med List Status: <None>   Medication Order Taking? Sig Documenting Provider Last Dose Status Informant  atorvastatin  (LIPITOR) 40 MG tablet [id number]  Take 1 tablet (40 mg total) by mouth daily. *Need appointment for future refills.DEWAINE Domenica Harlene DELENA, MD  Active   Blood Glucose Monitoring Suppl (ONE TOUCH ULTRA 2) w/Device KIT [id number]  Use glucometer to test sugars once daily and as needed. Dx E11.9 Domenica Harlene DELENA, MD  Active   donepezil  (ARICEPT ) 10 MG tablet [id number]  Take 1 tablet (10 mg total) by mouth daily. Wertman, Sara E, PA-C  Active   Ferrous Fumarate -Folic Acid  324-1 MG TABS [id number]  Take 1 tablet by mouth daily. Domenica Harlene DELENA, MD  Active   glucose blood (ONETOUCH ULTRA) test strip [id number]  Use to check sugar daily and as needed.  Dx Code: E11.9 Domenica Harlene DELENA, MD  Active   metFORMIN  (GLUCOPHAGE ) 500 MG tablet [id number]  Take 1 tablet (500 mg total) by mouth 2 (two) times daily with a meal. Domenica Harlene DELENA, MD  Active   [facility] Lancets 33G [place] [id number]  Use as directed once daily and as needed.  Dx Code: E11.9 Domenica Harlene DELENA, MD  Active             Recommendation:   Continue Current Plan of Care  Follow Up Plan:   Telephone follow up appointment date/time:  10/06/24 at 11:30 AM  Rosaline Arno, RN MSN [place]  [facility] RN Care Manager Direct Dial: [phone]  Fax: [phone]

## 2024-10-05 NOTE — Patient Instructions (Signed)
 Visit Information  Thank you for taking time to visit with me today. Please don't hesitate to contact me if I can be of assistance to you before our next scheduled appointment.  Your next care management appointment is by telephone on 10/06/24 at 11:30 AM  Please call the care guide team at [phone] if you need to cancel, schedule, or reschedule an appointment.   Please call the Suicide and Crisis Lifeline: 988 call 1-800-273-TALK (toll free, 24 hour hotline) if you are experiencing a Mental Health or Behavioral Health Crisis or need someone to talk to.  Rosaline Finlay, RN MSN [place]  [facility] RN Care Manager Direct Dial: [phone]  Fax: [phone]   Following is a copy of your care plan:   Goals Addressed             This Visit's Progress    [facility] RN Care Plan   Worsening    Problems:  Chronic Disease Management support and education needs related to recent decline in health - trouble swallowing and cough, weakness, incontinence of bowel and bladder Language Barrier  Goal: Over the next 30 days the Caregiver Patient will establish with hospice as evidenced by caregiver report  Interventions:   General Interventions Discussed recent decline in patient's functional status and office visit 10/03/24. Note that referral was placed for [facility] hospice. Confirmed with patient's daughter that initial visit is scheduled for this afternoon at 2:30 PM. Discussed what hospice is and where services are offered. Advised to complete visit with hospice representative, and care team will contact her after to determine if hospice services have been started/discuss plan of care moving forward  Patient/family Self-Care Activities:  Complete initial assessment with [facility] hospice   Plan:  Telephone follow up appointment with care management team member scheduled for:  10/06/24 at 11:30 AM

## 2024-10-06 ENCOUNTER — Telehealth: Payer: Self-pay

## 2024-10-06 NOTE — Telephone Encounter (Signed)
 LMOM for Marvin Mcdonald informing Pt that PCP will remain as attending during hospice.

## 2024-10-06 NOTE — Telephone Encounter (Signed)
 Please advise

## 2024-10-06 NOTE — Telephone Encounter (Signed)
 Spoke w/ Erie at [facility] and she advised that patient was approved for hospice care yesterday,10/05/24 and family would like for Dr. Domenica to stay pts PCP. Erie was advised that message will be send to Dr. Domenica and she is out the office until next week.

## 2024-10-06 NOTE — Telephone Encounter (Signed)
 Copied from CRM #[id number]. Topic: General - Other >> Oct 06, 2024  9:37 AM Berneda FALCON wrote: Reason for CRM: Star from [facility] calling again regarding this patient. States he is running out of time and just wants to know if [facility] is going to be his PCP during his transition to Hospice care. Message was sent on 12/3 in this regard but they have not yet heard back. Called CAL to confirm and spoke to nurse Garrie who took the call.

## 2024-10-06 NOTE — Patient Outreach (Signed)
 Spoke with patient's daughter. She reports visit with hospice admission RN went well yesterday and they are going to find placement for patient. Patient's daughter reports she has an appointment this afternoon with hospice social worker and [facility]. Patient will be closed from CCM at this time due to hospice enrollment.  Rosaline Finlay, RN MSN [place]  [facility] Population [facility] RN Care Manager Direct Dial: [phone]  Fax: [phone]

## 2024-10-17 ENCOUNTER — Ambulatory Visit: Admitting: Physician Assistant

## 2024-10-24 ENCOUNTER — Other Ambulatory Visit (HOSPITAL_BASED_OUTPATIENT_CLINIC_OR_DEPARTMENT_OTHER): Payer: Self-pay

## 2024-10-24 MED ORDER — HEMATINIC/FOLIC ACID 324-1 MG PO TABS
1.0000 | ORAL_TABLET | Freq: Every day | ORAL | 2 refills | Status: DC
  Filled 2024-10-24: qty 30, 30d supply, fill #0

## 2024-10-30 ENCOUNTER — Other Ambulatory Visit (HOSPITAL_BASED_OUTPATIENT_CLINIC_OR_DEPARTMENT_OTHER): Payer: Self-pay

## 2024-11-03 ENCOUNTER — Telehealth: Payer: Self-pay | Admitting: Family Medicine

## 2024-11-03 NOTE — Telephone Encounter (Signed)
 Returned pt's daughter call and she reports the original form needs to be completed not a copy. DNR form was placed in Dr. Domenica folder to be completed. Just an FYI

## 2024-11-03 NOTE — Telephone Encounter (Signed)
 Pt needs a copy of dnr for home health. POA will come pick it up. Please cal when ready

## 2024-11-03 NOTE — Telephone Encounter (Signed)
 Copied from CRM #[id number]. Topic: General - Other >> Nov 03, 2024 10:17 AM Zebedee SAUNDERS wrote: Reason for CRM: Pt's daughter Adrion, Menz [phone] called to see if Dr. Domenica can see pt via video since pt is in hospice and has a scheduled appt on 11/06/2024. Please call Ms. Spies [phone].

## 2024-11-03 NOTE — Telephone Encounter (Signed)
 Spoke with pts daughter Veneta and she wanted his appointment changed due to her work schedule. Pts appointment was changed to 12/14/24 @ 11:20 AM.

## 2024-11-06 ENCOUNTER — Ambulatory Visit: Admitting: Family Medicine

## 2024-11-06 NOTE — Telephone Encounter (Signed)
 Called daughter to advise that DNR form is ready for pick up.

## 2024-11-14 ENCOUNTER — Other Ambulatory Visit (HOSPITAL_BASED_OUTPATIENT_CLINIC_OR_DEPARTMENT_OTHER): Payer: Self-pay

## 2024-11-14 MED ORDER — POLYETHYLENE GLYCOL 3350 17 GM/SCOOP PO POWD
17.0000 g | ORAL | 0 refills | Status: DC
  Filled 2024-11-14: qty 510, 30d supply, fill #0

## 2024-11-17 ENCOUNTER — Other Ambulatory Visit (HOSPITAL_BASED_OUTPATIENT_CLINIC_OR_DEPARTMENT_OTHER): Payer: Self-pay

## 2024-11-21 ENCOUNTER — Other Ambulatory Visit: Payer: Self-pay | Admitting: Family Medicine

## 2024-11-21 ENCOUNTER — Other Ambulatory Visit: Payer: Self-pay

## 2024-11-22 ENCOUNTER — Other Ambulatory Visit: Payer: Self-pay

## 2024-11-22 ENCOUNTER — Other Ambulatory Visit (HOSPITAL_BASED_OUTPATIENT_CLINIC_OR_DEPARTMENT_OTHER): Payer: Self-pay

## 2024-11-22 MED ORDER — ATORVASTATIN CALCIUM 40 MG PO TABS
40.0000 mg | ORAL_TABLET | Freq: Every day | ORAL | 0 refills | Status: DC
  Filled 2024-11-22: qty 90, 90d supply, fill #0

## 2024-12-04 ENCOUNTER — Other Ambulatory Visit (HOSPITAL_BASED_OUTPATIENT_CLINIC_OR_DEPARTMENT_OTHER): Payer: Self-pay

## 2024-12-04 MED ORDER — MORPHINE SULFATE (CONCENTRATE) 10 MG /0.5 ML PO SOLN
5.0000 mg | ORAL | 0 refills | Status: DC | PRN
  Filled 2024-12-04: qty 30, 20d supply, fill #0

## 2024-12-04 MED ORDER — LORAZEPAM 0.5 MG PO TABS
ORAL_TABLET | ORAL | 2 refills | Status: DC
  Filled 2024-12-04: qty 84, 14d supply, fill #0

## 2024-12-04 MED ORDER — HYOSCYAMINE SULFATE 0.125 MG PO TABS
ORAL_TABLET | ORAL | 2 refills | Status: DC
  Filled 2024-12-04: qty 30, 5d supply, fill #0

## 2024-12-05 ENCOUNTER — Encounter: Payer: Self-pay | Admitting: Family Medicine

## 2024-12-06 ENCOUNTER — Other Ambulatory Visit (HOSPITAL_BASED_OUTPATIENT_CLINIC_OR_DEPARTMENT_OTHER): Payer: Self-pay

## 2024-12-06 ENCOUNTER — Telehealth: Payer: Self-pay | Admitting: Family Medicine

## 2024-12-06 NOTE — Telephone Encounter (Signed)
 Spoke with Clinical Triage nurse at [facility].  She states patient had cardiovascular disease, a patient of hospice and death was expected.  Patient stopped breathing, no rescue efforts attempted.  Daughter was by patients side.

## 2024-12-06 NOTE — Telephone Encounter (Signed)
 Copied from CRM #[id number]. Topic: General - Deceased Patient >> Dec 09, 2024  8:59 AM Rea BROCKS wrote: Name of caller: [name] (Daughter)   Calling to follow up on Death Certificates being signed as soon as possible. They need to make funeral arrangements and they can't make arrangements until it is signed.   [phone] Ms. Katzenberger) Daughter  (Was a [facility] pt. -LS)

## 2024-12-06 NOTE — Telephone Encounter (Signed)
 Copied from CRM [id number]. Topic: General - Deceased Patient >> 25-Dec-2024  1:29 PM Deleta RAMAN wrote: Name of caller: RONIT MARCZAK  Date of death: 12-24-24   Name of funeral home: [facility]   Phone number of funeral home: [phone]  Provider that needs to sign form: Harlene Horton  Timeline for signing: ASAP

## 2024-12-07 NOTE — Telephone Encounter (Signed)
 Copied from CRM #[id number]. Topic: General - Deceased Patient >> Dec 23, 2024  8:32 AM Zy'onna H wrote: Name of caller: [name]  Date of death: 12/21/2024   Name of funeral home:  [facility]   Phone number of funeral home: [phone]  Provider that needs to sign form: Harlene Horton   Timeline for signing: ASAP  **ATTN**   CRM #2 Regarding Death Certificate. I spoke with the daughter Andra) and she stated the funeral home is still waiting on these forms to be signed.  Upon speaking with the CAL these forms were signed this morning and the family and funeral home should be able to access these forms shortly.  While on hold, the patient was unable to hear me, I completed patient outreach and LVM.  Please Advise

## 2024-12-07 NOTE — Telephone Encounter (Signed)
 Spoke to interior and spatial designer at funeral home and they received death certificate.

## 2024-12-07 NOTE — Telephone Encounter (Unsigned)
 Copied from CRM #[id number]. Topic: General - Deceased Patient >> Dec 23, 2024  8:32 AM Zy'onna H wrote: Name of caller: [name]  Date of death: 12/21/2024   Name of funeral home:  [facility]   Phone number of funeral home: [phone]  Provider that needs to sign form: Harlene Horton   Timeline for signing: ASAP  **ATTN**   CRM #2 Regarding Death Certificate. I spoke with the daughter Andra) and she stated the funeral home is still waiting on these forms to be signed.  Upon speaking with the CAL these forms were signed this morning and the family and funeral home should be able to access these forms shortly.  While on hold, the patient was unable to hear me, I completed patient outreach and LVM.  Please Advise

## 2024-12-14 ENCOUNTER — Ambulatory Visit: Admitting: Family Medicine

## 2024-12-31 DEATH — deceased
